# Patient Record
Sex: Male | Born: 1950 | Race: White | Hispanic: No | Marital: Married | Smoking: Former smoker
Health system: Southern US, Community
[De-identification: ages and names within clinical notes are randomized; demographics above are authoritative.]

## PROBLEM LIST (undated history)

## (undated) DIAGNOSIS — G4733 Obstructive sleep apnea (adult) (pediatric): Secondary | ICD-10-CM

## (undated) DIAGNOSIS — I10 Essential (primary) hypertension: Secondary | ICD-10-CM

## (undated) DIAGNOSIS — K219 Gastro-esophageal reflux disease without esophagitis: Secondary | ICD-10-CM

## (undated) DIAGNOSIS — Z973 Presence of spectacles and contact lenses: Secondary | ICD-10-CM

## (undated) DIAGNOSIS — N529 Male erectile dysfunction, unspecified: Secondary | ICD-10-CM

## (undated) DIAGNOSIS — M1990 Unspecified osteoarthritis, unspecified site: Secondary | ICD-10-CM

## (undated) DIAGNOSIS — R7303 Prediabetes: Secondary | ICD-10-CM

## (undated) DIAGNOSIS — T7840XA Allergy, unspecified, initial encounter: Secondary | ICD-10-CM

## (undated) DIAGNOSIS — E119 Type 2 diabetes mellitus without complications: Secondary | ICD-10-CM

## (undated) DIAGNOSIS — I2510 Atherosclerotic heart disease of native coronary artery without angina pectoris: Secondary | ICD-10-CM

## (undated) DIAGNOSIS — N400 Enlarged prostate without lower urinary tract symptoms: Secondary | ICD-10-CM

## (undated) DIAGNOSIS — I480 Paroxysmal atrial fibrillation: Secondary | ICD-10-CM

## (undated) DIAGNOSIS — C61 Malignant neoplasm of prostate: Secondary | ICD-10-CM

## (undated) DIAGNOSIS — E782 Mixed hyperlipidemia: Secondary | ICD-10-CM

## (undated) HISTORY — DX: Paroxysmal atrial fibrillation: I48.0

## (undated) HISTORY — PX: OTHER SURGICAL HISTORY: SHX169

## (undated) HISTORY — PX: POLYPECTOMY: SHX149

## (undated) HISTORY — DX: Prediabetes: R73.03

## (undated) HISTORY — DX: Allergy, unspecified, initial encounter: T78.40XA

## (undated) HISTORY — PX: COLONOSCOPY: SHX174

## (undated) HISTORY — PX: PROSTATE BIOPSY: SHX241

## (undated) HISTORY — DX: Malignant neoplasm of prostate: C61

## (undated) HISTORY — PX: FINGER SURGERY: SHX640

---

## 2001-04-04 ENCOUNTER — Emergency Department (HOSPITAL_COMMUNITY): Admission: EM | Admit: 2001-04-04 | Discharge: 2001-04-04 | Payer: Self-pay | Admitting: Emergency Medicine

## 2001-04-04 ENCOUNTER — Encounter: Payer: Self-pay | Admitting: Emergency Medicine

## 2002-01-07 ENCOUNTER — Encounter: Admission: RE | Admit: 2002-01-07 | Discharge: 2002-01-07 | Payer: Self-pay | Admitting: Gastroenterology

## 2002-01-07 ENCOUNTER — Encounter: Payer: Self-pay | Admitting: Gastroenterology

## 2002-01-26 ENCOUNTER — Encounter: Payer: Self-pay | Admitting: Internal Medicine

## 2005-02-17 HISTORY — PX: ELBOW SURGERY: SHX618

## 2005-03-18 ENCOUNTER — Encounter: Payer: Self-pay | Admitting: Internal Medicine

## 2009-08-17 DIAGNOSIS — C61 Malignant neoplasm of prostate: Secondary | ICD-10-CM

## 2009-08-17 HISTORY — DX: Malignant neoplasm of prostate: C61

## 2009-09-25 ENCOUNTER — Ambulatory Visit: Admission: RE | Admit: 2009-09-25 | Discharge: 2009-10-23 | Payer: Self-pay | Admitting: Radiation Oncology

## 2010-02-27 ENCOUNTER — Ambulatory Visit (HOSPITAL_COMMUNITY)
Admission: RE | Admit: 2010-02-27 | Discharge: 2010-02-27 | Payer: Self-pay | Source: Home / Self Care | Attending: Urology | Admitting: Urology

## 2010-03-04 LAB — CREATININE, SERUM
Creatinine, Ser: 1.04 mg/dL (ref 0.4–1.5)
GFR calc Af Amer: >60 mL/min (ref >60)
GFR calc non Af Amer: >60 mL/min (ref >60)

## 2011-02-18 DIAGNOSIS — C61 Malignant neoplasm of prostate: Secondary | ICD-10-CM

## 2011-02-18 HISTORY — DX: Malignant neoplasm of prostate: C61

## 2013-02-13 ENCOUNTER — Emergency Department (HOSPITAL_COMMUNITY)
Admission: EM | Admit: 2013-02-13 | Discharge: 2013-02-13 | Disposition: A | Discharge status: Home / Self Care | Payer: No Typology Code available for payment source | Attending: Emergency Medicine | Admitting: Emergency Medicine

## 2013-02-13 ENCOUNTER — Encounter (HOSPITAL_COMMUNITY): Payer: Self-pay | Admitting: Emergency Medicine

## 2013-02-13 DIAGNOSIS — R51 Headache: Secondary | ICD-10-CM | POA: Insufficient documentation

## 2013-02-13 DIAGNOSIS — Z8546 Personal history of malignant neoplasm of prostate: Secondary | ICD-10-CM | POA: Insufficient documentation

## 2013-02-13 DIAGNOSIS — I10 Essential (primary) hypertension: Secondary | ICD-10-CM | POA: Insufficient documentation

## 2013-02-13 HISTORY — DX: Essential (primary) hypertension: I10

## 2013-02-13 MED ORDER — QUINAPRIL HCL 10 MG PO TABS: 10.0000 mg | ORAL_TABLET | Freq: Every day | ORAL | Status: DC

## 2013-02-13 MED ORDER — LISINOPRIL 10 MG PO TABS
10.0000 mg | ORAL_TABLET | Freq: Every day | ORAL | Status: DC
  Administered 2013-02-13: 10 mg via ORAL
  Filled 2013-02-13: qty 1

## 2013-02-13 NOTE — ED Provider Notes (Signed)
Medical screening examination/treatment/procedure(s) were performed by non-physician practitioner and as supervising physician I was immediately available for consultation/collaboration.  EKG Interpretation   None        Ethelda Chick, MD 02/13/13 1247

## 2013-02-13 NOTE — ED Notes (Signed)
Pt states that he was being assessed for insurance purposes.  BP was over 200 systolic.  Was referred to [facility] family who sent him here d/t BP still being above 200 systolic.  States hx of htn.  Was on quinopril 20 mgs.  States that he quit taking it 6 mos ago because "he didn't need it".  Asymptomatic at this point.

## 2013-02-13 NOTE — ED Provider Notes (Signed)
CSN: [id number]     Arrival date & time 02/13/13  1120 History   First MD Initiated Contact with Patient 02/13/13 1210    This chart was scribed for James Edwards, a non-physician practitioner working with Ethelda Chick, MD by Lewanda Rife, ED Scribe. This patient was seen in room WTR5/WTR5 and the patient's care was started at 12:19 PM     Chief Complaint  Patient presents with  . Hypertension   (Consider location/radiation/quality/duration/timing/severity/associated sxs/prior Treatment) The history is provided by the patient. No language interpreter was used.   HPI Comments: James Edwards is a 62 y.o. male who presents to the Emergency Department with a PMHx of prostate cancer and HTN complaining of hypertension onset today checked BP at [facility] Physician's was 200/98 and it was recommended to go to the ED. Reports associated baseline non-acute headache. Denies any aggravating factors. Reports HTN is usually alleviated by quinapril. Denies associated facial drooping, vision loss, visual disturbances, abdominal pain, chest pain, shortness of breath, change in gait, and urinary or bowel changes. Self-discontinued BP meds 6 months ago.   Past Medical History  Diagnosis Date  . Hypertension    History reviewed. No pertinent past surgical history. History reviewed. No pertinent family history. History  Substance Use Topics  . Smoking status: Never Smoker   . Smokeless tobacco: Not on file  . Alcohol Use: No    Review of Systems  Constitutional: Negative for fever.  HENT: Negative for rhinorrhea and sore throat.   Eyes: Negative for redness and visual disturbance.  Respiratory: Negative for cough and shortness of breath.   Cardiovascular: Negative for chest pain.  Gastrointestinal: Negative for nausea, vomiting, abdominal pain, diarrhea and constipation.  Genitourinary: Negative for dysuria and decreased urine volume.  Musculoskeletal: Negative for gait problem and  myalgias.  Skin: Negative for rash.  Neurological: Negative for facial asymmetry, speech difficulty, weakness, numbness and headaches.  Psychiatric/Behavioral: Negative for confusion.    Allergies  Codeine  Home Medications   Current Outpatient Rx  Name  Route  Sig  Dispense  Refill  . acetaminophen (TYLENOL) 325 MG tablet   Oral   Take 650 mg by mouth every 6 (six) hours as needed for mild pain or moderate pain.          BP 179/89  Pulse 78  Temp(Src) 98.4 F (36.9 C) (Oral)  Resp 14  SpO2 99% Physical Exam  Nursing note and vitals reviewed. Constitutional: He appears well-developed and well-nourished. No distress.  HENT:  Head: Normocephalic and atraumatic.  Eyes: Conjunctivae and EOM are normal. Right eye exhibits no discharge. Left eye exhibits no discharge.  Neck: Normal range of motion. Neck supple. No tracheal deviation present.  Cardiovascular: Normal rate, regular rhythm and normal heart sounds.   No murmur heard. Pulmonary/Chest: Effort normal and breath sounds normal. No respiratory distress. He has no wheezes. He has no rales.  Abdominal: Soft. Bowel sounds are normal. There is no tenderness.  Musculoskeletal: Normal range of motion.  Neurological: He is alert.  Skin: Skin is warm and dry.  Psychiatric: He has a normal mood and affect. His behavior is normal.    ED Course  Procedures (including critical care time)  COORDINATION OF CARE:  Nursing notes reviewed. Vital signs reviewed. Initial pt interview and examination performed.   12:24 PM-Discussed treatment plan with pt at bedside. Pt agrees with plan.   Treatment plan initiated:Medications - No data to display   Initial diagnostic testing ordered.  Labs Review Labs Reviewed - No data to display Imaging Review No results found.  EKG Interpretation   None      Vital signs reviewed and are as follows: Filed Vitals:   02/13/13 1132  BP: 179/89  Pulse: 78  Temp: 98.4 F (36.9 C)   Resp: 14   Will restart antihypertensives. Patient encouraged to followup with PCP for appropriate titration of medications.  Patient counseled to return if they have weakness in their arms or legs, slurred speech, trouble walking or talking, confusion, trouble with their balance, or if they have any other concerns. Patient verbalizes understanding and agrees with plan.    MDM   1. Hypertension    Patient with hypertension, no signs of end organ damage given history and exam. Will restart antihypertensive. No other acute treatment or workup indicated at this time. Patient to followup with his primary care physician and return with worsening symptoms.  I personally performed the services described in this documentation, which was scribed in my presence. The recorded information has been reviewed and is accurate.    [name], PA-C 02/13/13 1243

## 2014-10-18 ENCOUNTER — Ambulatory Visit (INDEPENDENT_AMBULATORY_CARE_PROVIDER_SITE_OTHER): Payer: Medicare Other | Admitting: Adult Health

## 2014-10-18 ENCOUNTER — Encounter: Payer: Self-pay | Admitting: Adult Health

## 2014-10-18 VITALS — BP 130/70 | Temp 98.4°F | Ht 69.5 in | Wt 198.0 lb

## 2014-10-18 DIAGNOSIS — Z23 Encounter for immunization: Secondary | ICD-10-CM | POA: Diagnosis not present

## 2014-10-18 DIAGNOSIS — Z7689 Persons encountering health services in other specified circumstances: Secondary | ICD-10-CM

## 2014-10-18 DIAGNOSIS — C61 Malignant neoplasm of prostate: Secondary | ICD-10-CM | POA: Diagnosis not present

## 2014-10-18 DIAGNOSIS — Z7189 Other specified counseling: Secondary | ICD-10-CM | POA: Diagnosis not present

## 2014-10-18 DIAGNOSIS — I10 Essential (primary) hypertension: Secondary | ICD-10-CM | POA: Diagnosis not present

## 2014-10-18 MED ORDER — QUINAPRIL HCL 40 MG PO TABS: 40.0000 mg | ORAL_TABLET | Freq: Every day | ORAL | Status: DC

## 2014-10-18 NOTE — Patient Instructions (Signed)
It was great meeting you today!  Schedule a complete physical for a month or two. If you need anything in the meantime, please let me know.   Please continue to work on diet and exercise.

## 2014-10-18 NOTE — Progress Notes (Signed)
HPI:  James Edwards is here to establish care. He is a very pleasant Caucasian male who  has a past medical history of Hypertension and Prostate cancer.  Last PCP and physical:Unknown, was not in the last year. Was a patient at [facility]   Immunizations: Needs Pneumovax 23 next year Diet:Does not follow a diet. Eats fruits but not a lot of vegetables. Not a lot of red meat.  Exercise: Walks for work. Does not do anything outside of work  Colonoscopy: Longer than 10 years, does not want one right now.  Eye: Sees eye doctor Dentist: Does not see  Has the following chronic problems that require follow up and concerns today:   Prostate Cancer - He is followed by [facility], Dr. Alinda Money and sees him quarterly. He has never had to have chemo or radiation for his prostate cancer. Denies any complications currently.    ROS negative for unless reported above: fevers, chills,feeling poorly, unintentional weight loss, hearing or vision loss, chest pain, palpitations, leg claudication, struggling to breath,Not feeling congested in the chest, no orthopenia, no cough,no wheezing, normal appetite, no soft tissue swelling, no hemoptysis, melena, hematochezia, hematuria, falls, loc, si, or thoughts of self harm.    Past Medical History  Diagnosis Date  . Hypertension   . Prostate cancer     Past Surgical History  Procedure Laterality Date  . Elbow surgery      Family History  Problem Relation Age of Onset  . Arthritis Father   . Heart disease Mother   . Heart disease Father   . Hypertension Father   . Hypertension Mother     Social History   Social History  . Marital Status: Married    Spouse Name: N/A  . Number of Children: N/A  . Years of Education: N/A   Social History Main Topics  . Smoking status: Former Research scientist (life sciences)  . Smokeless tobacco: None     Comment: per pt stopped in 2006   . Alcohol Use: 0.0 oz/week    0 Standard drinks or equivalent per week   Comment: 6 pack a day   . Drug Use: No  . Sexual Activity: Not Asked   Other Topics Concern  . None   Social History Narrative     Current outpatient prescriptions:  .  acetaminophen (TYLENOL) 325 MG tablet, Take 650 mg by mouth every 6 (six) hours as needed for mild pain or moderate pain., Disp: , Rfl:  .  quinapril (ACCUPRIL) 40 MG tablet, Take 40 mg by mouth at bedtime., Disp: , Rfl:   EXAM:  Filed Vitals:   10/18/14 0856  BP: 130/70  Temp: 98.4 F (36.9 C)    Body mass index is 28.83 kg/(m^2).  GENERAL: vitals reviewed and listed above, alert, oriented, appears well hydrated and in no acute distress. Slightly obese  HEENT: atraumatic, conjunttiva clear, no obvious abnormalities on inspection of external nose and ears. TM's visualized, no cerumen impaction. Multiple filled cavities. Wears glasses  NECK: Neck is soft and supple without masses, no adenopathy or thyromegaly, trachea midline, no JVD. Normal range of motion.   LUNGS: clear to auscultation bilaterally, no wheezes, rales or rhonchi, good air movement  CV: Regular rate and rhythm, normal S1/S2, no audible murmurs, gallops, or rubs. No carotid bruit and no peripheral edema.   MS: moves all extremities without noticeable abnormality. No edema noted  Abd: soft/nontender/nondistended/normal bowel sounds. Obese around abdomen.   Skin: warm and dry, no rash  Extremities: No clubbing, cyanosis, or edema. Capillary refill is WNL. Pulses intact bilaterally in upper and lower extremities.   Neuro: CN II-XII intact, sensation and reflexes normal throughout, 5/5 muscle strength in bilateral upper and lower extremities. Normal finger to nose. Normal rapid alternating movements. Normal romberg. No pronator drift.   PSYCH: pleasant and cooperative, no obvious depression or anxiety  ASSESSMENT AND PLAN:  1. Encounter to establish care - Follow up in one month for CPE - Follow up sooner if needed - Stressed the  importance of exercising outside of work and following a heart healthy diet.   2. Essential hypertension - Controlled - no change - quinapril (ACCUPRIL) 40 MG tablet; Take 1 tablet (40 mg total) by mouth daily.  Dispense: 90 tablet; Refill: 3  3. Prostate cancer - Continue with current treatment by Urology  4. Need for Streptococcus pneumoniae vaccination - Pneumococcal conjugate vaccine 13-valent IM  5. Encounter for immunization - High dose flu given    -We reviewed the PMH, PSH, FH, SH, Meds and Allergies. -We provided refills for any medications we will prescribe as needed. -We addressed current concerns per orders and patient instructions. -We have asked for records for pertinent exams, studies, vaccines and notes from previous providers. -We have advised patient to follow up per instructions below.   -Patient advised to return or notify a provider immediately if symptoms worsen or persist or new concerns arise.    Dorothyann Peng, AGNP

## 2014-10-25 DIAGNOSIS — C61 Malignant neoplasm of prostate: Secondary | ICD-10-CM | POA: Diagnosis not present

## 2014-10-30 ENCOUNTER — Ambulatory Visit: Payer: Self-pay | Admitting: Adult Health

## 2014-11-01 DIAGNOSIS — C61 Malignant neoplasm of prostate: Secondary | ICD-10-CM | POA: Diagnosis not present

## 2015-02-22 DIAGNOSIS — H2513 Age-related nuclear cataract, bilateral: Secondary | ICD-10-CM | POA: Diagnosis not present

## 2015-05-04 DIAGNOSIS — C61 Malignant neoplasm of prostate: Secondary | ICD-10-CM | POA: Diagnosis not present

## 2015-05-11 DIAGNOSIS — Z0000 Encounter for general adult medical examination without abnormal findings: Secondary | ICD-10-CM | POA: Diagnosis not present

## 2015-05-11 DIAGNOSIS — C61 Malignant neoplasm of prostate: Secondary | ICD-10-CM | POA: Diagnosis not present

## 2015-05-11 DIAGNOSIS — N5201 Erectile dysfunction due to arterial insufficiency: Secondary | ICD-10-CM | POA: Diagnosis not present

## 2015-10-15 ENCOUNTER — Other Ambulatory Visit: Payer: Self-pay | Admitting: Adult Health

## 2015-10-15 DIAGNOSIS — I10 Essential (primary) hypertension: Secondary | ICD-10-CM

## 2015-10-24 ENCOUNTER — Encounter: Payer: Self-pay | Admitting: Adult Health

## 2015-10-24 ENCOUNTER — Ambulatory Visit (INDEPENDENT_AMBULATORY_CARE_PROVIDER_SITE_OTHER): Payer: Medicare Other | Admitting: Adult Health

## 2015-10-24 VITALS — BP 138/90 | Temp 98.6°F | Ht 69.5 in | Wt 201.8 lb

## 2015-10-24 DIAGNOSIS — Z23 Encounter for immunization: Secondary | ICD-10-CM

## 2015-10-24 DIAGNOSIS — I10 Essential (primary) hypertension: Secondary | ICD-10-CM | POA: Diagnosis not present

## 2015-10-24 MED ORDER — QUINAPRIL HCL 40 MG PO TABS: 40.0000 mg | ORAL_TABLET | Freq: Every day | ORAL | 3 refills | Status: DC

## 2015-10-24 NOTE — Patient Instructions (Signed)
It was great seeing you again  I will send in your prescription   Schedule your physical on the way out.   Please let me know if you need anything

## 2015-10-24 NOTE — Progress Notes (Signed)
Subjective:    Patient ID: James Edwards, male    DOB: 07-26-1950, 65 y.o.   MRN: [id number]  HPI  65 year old male who presents to the office today for follow up regarding hypertension and to have his pneumonia and influenza vaccination.   He reports that his blood pressure has been well controlled. He has not had any blurred vision, headaches or dizziness. He continues to walk daily and is eating healthy.   He voices no concerns or acute issues that he would like addressed at this time.    Review of Systems  Constitutional: Negative.   Respiratory: Negative.   Cardiovascular: Negative.   Neurological: Negative.   Psychiatric/Behavioral: Negative.   All other systems reviewed and are negative.  Past Medical History:  Diagnosis Date  . Hypertension   . Prostate cancer [facility])     Social History   Social History  . Marital status: Married    Spouse name: N/A  . Number of children: N/A  . Years of education: N/A   Occupational History  . Not on file.   Social History Main Topics  . Smoking status: Former Research scientist (life sciences)  . Smokeless tobacco: Not on file     Comment: per pt stopped in 2006   . Alcohol use 0.0 oz/week     Comment: 6 pack a day   . Drug use: No  . Sexual activity: Not on file   Other Topics Concern  . 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    Two Children who both live locally.       He likes to sleep.     Past Surgical History:  Procedure Laterality Date  . ELBOW SURGERY  2007    Family History  Problem Relation Age of Onset  . Arthritis Father   . Heart disease Mother   . Heart disease Father   . Hypertension Father   . Hypertension Mother   . Bladder Cancer Mother   . Bladder Cancer Father   . Glaucoma Mother     Allergies  Allergen Reactions  . Codeine     NV    Current Outpatient Prescriptions on File Prior to Visit  Medication Sig Dispense  Refill  . acetaminophen (TYLENOL) 325 MG tablet Take 650 mg by mouth every 6 (six) hours as needed for mild pain or moderate pain.    Marland Kitchen quinapril (ACCUPRIL) 40 MG tablet TAKE ONE TABLET BY MOUTH ONCE DAILY 30 tablet 0   No current facility-administered medications on file prior to visit.     BP 138/90   Temp 98.6 F (37 C) (Oral)   Ht 5' 9.5" (1.765 m)   Wt 201 lb 12.8 oz (91.5 kg)   BMI 29.37 kg/m       Objective:   Physical Exam  Constitutional: He is oriented to person, place, and time. He appears well-developed and well-nourished. No distress.  Cardiovascular: Normal rate, regular rhythm, normal heart sounds and intact distal pulses.  Exam reveals no gallop.   No murmur heard. Pulmonary/Chest: Effort normal and breath sounds normal. No respiratory distress. He has no wheezes. He has no rales. He exhibits no tenderness.  Musculoskeletal: Normal range of motion. He exhibits no edema, tenderness or deformity.  Neurological: He is alert and oriented to person, place, and time.  Skin: Skin is warm and dry. No rash  noted. He is not diaphoretic. No erythema. No pallor.  Psychiatric: He has a normal mood and affect. His behavior is normal. Judgment and thought content normal.  Nursing note and vitals reviewed.     Assessment & Plan:  1. Need for prophylactic vaccination and inoculation against influenza - Flu vaccine HIGH DOSE PF (Fluzone High dose)  2. Essential hypertension - quinapril (ACCUPRIL) 40 MG tablet; Take 1 tablet (40 mg total) by mouth daily.  Dispense: 90 tablet; Refill: 3 - Advised that he needs to follow up for his CPE   3. Need for prophylactic vaccination against Streptococcus pneumoniae (pneumococcus)  - Pneumococcal polysaccharide vaccine 23-valent greater than or equal to 2yo subcutaneous/IM  Dorothyann Peng, NP

## 2015-12-13 DIAGNOSIS — C61 Malignant neoplasm of prostate: Secondary | ICD-10-CM | POA: Diagnosis not present

## 2015-12-24 ENCOUNTER — Other Ambulatory Visit: Payer: Self-pay | Admitting: Urology

## 2015-12-25 ENCOUNTER — Other Ambulatory Visit (HOSPITAL_COMMUNITY): Payer: Self-pay | Admitting: Urology

## 2015-12-25 DIAGNOSIS — C61 Malignant neoplasm of prostate: Secondary | ICD-10-CM

## 2016-01-02 ENCOUNTER — Encounter: Payer: Medicare Other | Admitting: Adult Health

## 2016-01-03 ENCOUNTER — Other Ambulatory Visit (INDEPENDENT_AMBULATORY_CARE_PROVIDER_SITE_OTHER): Payer: Medicare Other

## 2016-01-03 ENCOUNTER — Encounter: Payer: Self-pay | Admitting: Adult Health

## 2016-01-03 ENCOUNTER — Telehealth: Payer: Self-pay | Admitting: Adult Health

## 2016-01-03 ENCOUNTER — Ambulatory Visit (INDEPENDENT_AMBULATORY_CARE_PROVIDER_SITE_OTHER): Payer: Medicare Other | Admitting: Adult Health

## 2016-01-03 VITALS — BP 168/88 | Temp 98.4°F | Ht 69.5 in | Wt 200.0 lb

## 2016-01-03 DIAGNOSIS — Z8249 Family history of ischemic heart disease and other diseases of the circulatory system: Secondary | ICD-10-CM | POA: Diagnosis not present

## 2016-01-03 DIAGNOSIS — C61 Malignant neoplasm of prostate: Secondary | ICD-10-CM | POA: Diagnosis not present

## 2016-01-03 DIAGNOSIS — Z1211 Encounter for screening for malignant neoplasm of colon: Secondary | ICD-10-CM

## 2016-01-03 DIAGNOSIS — I10 Essential (primary) hypertension: Secondary | ICD-10-CM

## 2016-01-03 DIAGNOSIS — E119 Type 2 diabetes mellitus without complications: Secondary | ICD-10-CM

## 2016-01-03 DIAGNOSIS — M1990 Unspecified osteoarthritis, unspecified site: Secondary | ICD-10-CM

## 2016-01-03 LAB — CBC WITH DIFFERENTIAL/PLATELET
Basophils Absolute: 0.1 10*3/uL (ref 0.0–0.1)
Basophils Relative: 0.6 % (ref 0.0–3.0)
Eosinophils Absolute: 0.4 10*3/uL (ref 0.0–0.7)
Eosinophils Relative: 3.8 % (ref 0.0–5.0)
HCT: 40.6 % (ref 39.0–52.0)
Hemoglobin: 13.8 g/dL (ref 13.0–17.0)
Lymphocytes Relative: 18.5 % (ref 12.0–46.0)
Lymphs Abs: 1.9 10*3/uL (ref 0.7–4.0)
MCHC: 34.1 g/dL (ref 30.0–36.0)
MCV: 93.3 fl (ref 78.0–100.0)
Monocytes Absolute: 0.8 10*3/uL (ref 0.1–1.0)
Monocytes Relative: 7.9 % (ref 3.0–12.0)
Neutro Abs: 7 10*3/uL (ref 1.4–7.7)
Neutrophils Relative %: 69.2 % (ref 43.0–77.0)
Platelets: 290 10*3/uL (ref 150.0–400.0)
RBC: 4.35 Mil/uL (ref 4.22–5.81)
RDW: 12.8 % (ref 11.5–15.5)
WBC: 10.1 10*3/uL (ref 4.0–10.5)

## 2016-01-03 LAB — HEPATIC FUNCTION PANEL
ALT: 20 U/L (ref 0–53)
AST: 26 U/L (ref 0–37)
Albumin: 4.1 g/dL (ref 3.5–5.2)
Alkaline Phosphatase: 33 U/L — ABNORMAL LOW (ref 39–117)
Bilirubin, Direct: 0.1 mg/dL (ref 0.0–0.3)
Total Bilirubin: 0.5 mg/dL (ref 0.2–1.2)
Total Protein: 6.7 g/dL (ref 6.0–8.3)

## 2016-01-03 LAB — BASIC METABOLIC PANEL
BUN: 11 mg/dL (ref 6–23)
CO2: 27 mEq/L (ref 19–32)
Calcium: 9.9 mg/dL (ref 8.4–10.5)
Chloride: 102 mEq/L (ref 96–112)
Creatinine, Ser: 1.02 mg/dL (ref 0.40–1.50)
GFR: 77.6 mL/min (ref >60.00)
Glucose, Bld: 155 mg/dL — ABNORMAL HIGH (ref 70–99)
Potassium: 5.3 mEq/L — ABNORMAL HIGH (ref 3.5–5.1)
Sodium: 137 mEq/L (ref 135–145)

## 2016-01-03 LAB — LIPID PANEL
Cholesterol: 194 mg/dL (ref 0–200)
HDL: 65 mg/dL (ref >39.00)
NonHDL: 129.39
Total CHOL/HDL Ratio: 3
Triglycerides: 296 mg/dL — ABNORMAL HIGH (ref 0.0–149.0)
VLDL: 59.2 mg/dL — ABNORMAL HIGH (ref 0.0–40.0)

## 2016-01-03 LAB — POC URINALSYSI DIPSTICK (AUTOMATED)
Bilirubin, UA: NEGATIVE
Blood, UA: NEGATIVE
Glucose, UA: NEGATIVE
Ketones, UA: NEGATIVE
Leukocytes, UA: NEGATIVE
Nitrite, UA: NEGATIVE
Protein, UA: NEGATIVE
Spec Grav, UA: 1.02
Urobilinogen, UA: 0.2
pH, UA: 5

## 2016-01-03 LAB — HEMOGLOBIN A1C: Hgb A1c MFr Bld: 6.6 % — ABNORMAL HIGH (ref 4.6–6.5)

## 2016-01-03 LAB — TSH: TSH: 1.2 u[IU]/mL (ref 0.35–4.50)

## 2016-01-03 LAB — LDL CHOLESTEROL, DIRECT: Direct LDL: 97 mg/dL

## 2016-01-03 MED ORDER — MELOXICAM 7.5 MG PO TABS: 7.5000 mg | ORAL_TABLET | Freq: Every day | ORAL | 0 refills | Status: DC

## 2016-01-03 NOTE — Patient Instructions (Signed)
It was great seeing you this morning.   Someone will call you to schedule your colonoscopy.   I have sent in a medication call Mobic. Take this once a day   Continue to work on diet and exercise  Follow up with me in one year   Health Maintenance, Male A healthy lifestyle and preventative care can promote health and wellness.  Maintain regular health, dental, and eye exams.  Eat a healthy diet. Foods like vegetables, fruits, whole grains, low-fat dairy products, and lean protein foods contain the nutrients you need and are low in calories. Decrease your intake of foods high in solid fats, added sugars, and salt. Get information about a proper diet from your health care provider, if necessary.  Regular physical exercise is one of the most important things you can do for your health. Most adults should get at least 150 minutes of moderate-intensity exercise (any activity that increases your heart rate and causes you to sweat) each week. In addition, most adults need muscle-strengthening exercises on 2 or more days a week.   Maintain a healthy weight. The body mass index (BMI) is a screening tool to identify possible weight problems. It provides an estimate of body fat based on height and weight. Your health care provider can find your BMI and can help you achieve or maintain a healthy weight. For males 20 years and older:  A BMI below 18.5 is considered underweight.  A BMI of 18.5 to 24.9 is normal.  A BMI of 25 to 29.9 is considered overweight.  A BMI of 30 and above is considered obese.  Maintain normal blood lipids and cholesterol by exercising and minimizing your intake of saturated fat. Eat a balanced diet with plenty of fruits and vegetables. Blood tests for lipids and cholesterol should begin at age 84 and be repeated every 5 years. If your lipid or cholesterol levels are high, you are over age 73, or you are at high risk for heart disease, you may need your cholesterol levels  checked more frequently.Ongoing high lipid and cholesterol levels should be treated with medicines if diet and exercise are not working.  If you smoke, find out from your health care provider how to quit. If you do not use tobacco, do not start.  Lung cancer screening is recommended for adults aged 51-80 years who are at high risk for developing lung cancer because of a history of smoking. A yearly low-dose CT scan of the lungs is recommended for people who have at least a 30-pack-year history of smoking and are current smokers or have quit within the past 15 years. A pack year of smoking is smoking an average of 1 pack of cigarettes a day for 1 year (for example, a 30-pack-year history of smoking could mean smoking 1 pack a day for 30 years or 2 packs a day for 15 years). Yearly screening should continue until the smoker has stopped smoking for at least 15 years. Yearly screening should be stopped for people who develop a health problem that would prevent them from having lung cancer treatment.  If you choose to drink alcohol, do not have more than 2 drinks per day. One drink is considered to be 12 oz (360 mL) of beer, 5 oz (150 mL) of wine, or 1.5 oz (45 mL) of liquor.  Avoid the use of street drugs. Do not share needles with anyone. Ask for help if you need support or instructions about stopping the use of drugs.  High blood pressure causes heart disease and increases the risk of stroke. High blood pressure is more likely to develop in:  People who have blood pressure in the end of the normal range (100-139/85-89 mm Hg).  People who are overweight or obese.  People who are African American.  If you are 42-49 years of age, have your blood pressure checked every 3-5 years. If you are 38 years of age or older, have your blood pressure checked every year. You should have your blood pressure measured twice--once when you are at a hospital or clinic, and once when you are not at a hospital or clinic.  Record the average of the two measurements. To check your blood pressure when you are not at a hospital or clinic, you can use:  An automated blood pressure machine at a pharmacy.  A home blood pressure monitor.  If you are 18-62 years old, ask your health care provider if you should take aspirin to prevent heart disease.  Diabetes screening involves taking a blood sample to check your fasting blood sugar level. This should be done once every 3 years after age 42 if you are at a normal weight and without risk factors for diabetes. Testing should be considered at a younger age or be carried out more frequently if you are overweight and have at least 1 risk factor for diabetes.  Colorectal cancer can be detected and often prevented. Most routine colorectal cancer screening begins at the age of 58 and continues through age 38. However, your health care provider may recommend screening at an earlier age if you have risk factors for colon cancer. On a yearly basis, your health care provider may provide home test kits to check for hidden blood in the stool. A small camera at the end of a tube may be used to directly examine the colon (sigmoidoscopy or colonoscopy) to detect the earliest forms of colorectal cancer. Talk to your health care provider about this at age 44 when routine screening begins. A direct exam of the colon should be repeated every 5-10 years through age 56, unless early forms of precancerous polyps or small growths are found.  People who are at an increased risk for hepatitis B should be screened for this virus. You are considered at high risk for hepatitis B if:  You were born in a country where hepatitis B occurs often. Talk with your health care provider about which countries are considered high risk.  Your parents were born in a high-risk country and you have not received a shot to protect against hepatitis B (hepatitis B vaccine).  You have HIV or AIDS.  You use needles to  inject street drugs.  You live with, or have sex with, someone who has hepatitis B.  You are a man who has sex with other men (MSM).  You get hemodialysis treatment.  You take certain medicines for conditions like cancer, organ transplantation, and autoimmune conditions.  Hepatitis C blood testing is recommended for all people born from [birth year] through 1965 and any individual with known risk factors for hepatitis C.  Healthy men should no longer receive prostate-specific antigen (PSA) blood tests as part of routine cancer screening. Talk to your health care provider about prostate cancer screening.  Testicular cancer screening is not recommended for adolescents or adult males who have no symptoms. Screening includes self-exam, a health care provider exam, and other screening tests. Consult with your health care provider about any symptoms you have or any  concerns you have about testicular cancer.  Practice safe sex. Use condoms and avoid high-risk sexual practices to reduce the spread of sexually transmitted infections (STIs).  You should be screened for STIs, including gonorrhea and chlamydia if:  You are sexually active and are younger than 24 years.  You are older than 24 years, and your health care provider tells you that you are at risk for this type of infection.  Your sexual activity has changed since you were last screened, and you are at an increased risk for chlamydia or gonorrhea. Ask your health care provider if you are at risk.  If you are at risk of being infected with HIV, it is recommended that you take a prescription medicine daily to prevent HIV infection. This is called pre-exposure prophylaxis (PrEP). You are considered at risk if:  You are a man who has sex with other men (MSM).  You are a heterosexual man who is sexually active with multiple partners.  You take drugs by injection.  You are sexually active with a partner who has HIV.  Talk with your health care  provider about whether you are at high risk of being infected with HIV. If you choose to begin PrEP, you should first be tested for HIV. You should then be tested every 3 months for as long as you are taking PrEP.  Use sunscreen. Apply sunscreen liberally and repeatedly throughout the day. You should seek shade when your shadow is shorter than you. Protect yourself by wearing long sleeves, pants, a wide-brimmed hat, and sunglasses year round whenever you are outdoors.  Tell your health care provider of new moles or changes in moles, especially if there is a change in shape or color. Also, tell your health care provider if a mole is larger than the size of a pencil eraser.  A one-time screening for abdominal aortic aneurysm (AAA) and surgical repair of large AAAs by ultrasound is recommended for men aged 67-75 years who are current or former smokers.  Stay current with your vaccines (immunizations).   This information is not intended to replace advice given to you by your health care provider. Make sure you discuss any questions you have with your health care provider.   Document Released: 08/02/2007 Document Revised: 02/24/2014 Document Reviewed: 07/01/2010 Elsevier Interactive Patient Education [employer].

## 2016-01-03 NOTE — Progress Notes (Addendum)
Subjective:    Patient ID: James Edwards, male    DOB: 01-26-1951, 65 y.o.   MRN: [id number]  HPI  65 year old male who presents to the office today for yearly follow up regarding hypertension and prostate cancer.   He reports that his blood pressure has been well controlled.. He has not had any blurred vision, headaches, or dizziness. He takes Accupril 40mg  tablet for hypertension   He is followed by [facility], Dr. Alinda Money and sees them every three months. He is having a biopsy done on December 7th.   He reports that his arthritic pain has started to become worse. He is taking Tylenol which " numbs" the pain. Pain is worse in bilateral hips. He does not want to see orthopedics yet.   All immunizations and health maintenance protocols were reviewed with the patient and needed orders were placed.  Appropriate screening laboratory values were ordered for the patient including screening of hyperlipidemia, renal function and hepatic function. If indicated by BPH, a PSA was ordered.  Medication reconciliation,  past medical history, social history, problem list and allergies were reviewed in detail with the patient  Goals were established with regard to weight loss, exercise, and  diet in compliance with medications. He continues to eat healthy and exercise  End of life planning was discussed.  He is ready to have a colonoscopy. He has seen his eye doctor but has not seen a dentist this year.    Review of Systems  Constitutional: Negative.   HENT: Negative.   Eyes: Negative.   Respiratory: Negative.   Cardiovascular: Negative.   Gastrointestinal: Negative.   Endocrine: Negative.   Genitourinary: Negative.   Musculoskeletal: Positive for arthralgias.  Skin: Negative.   Allergic/Immunologic: Negative.   Neurological: Negative.   Hematological: Negative.   Psychiatric/Behavioral: Negative.   All other systems reviewed and are negative.  Past Medical History:    Diagnosis Date  . Hypertension   . Prostate cancer [facility])     Social History   Social History  . Marital status: Married    Spouse name: N/A  . Number of children: N/A  . Years of education: N/A   Occupational History  . Not on file.   Social History Main Topics  . Smoking status: Former Research scientist (life sciences)  . Smokeless tobacco: Not on file     Comment: per pt stopped in 2006   . Alcohol use 0.0 oz/week     Comment: 6 pack a day   . Drug use: No  . Sexual activity: Not on file   Other Topics Concern  . 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    Two Children who both live locally.       He likes to sleep.     Past Surgical History:  Procedure Laterality Date  . ELBOW SURGERY  2007    Family History  Problem Relation Age of Onset  . Arthritis Father   . Heart disease Mother   . Heart disease Father   . Hypertension Father   . Hypertension Mother   . Bladder Cancer Mother   . Bladder Cancer Father   . Glaucoma Mother     Allergies  Allergen Reactions  . Codeine     NV    Current Outpatient Prescriptions on File Prior to Visit  Medication Sig Dispense Refill  .  acetaminophen (TYLENOL) 325 MG tablet Take 650 mg by mouth every 6 (six) hours as needed for mild pain or moderate pain.    Marland Kitchen quinapril (ACCUPRIL) 40 MG tablet Take 1 tablet (40 mg total) by mouth daily. 90 tablet 3   No current facility-administered medications on file prior to visit.     BP (!) 168/88   Temp 98.4 F (36.9 C) (Oral)   Ht 5' 9.5" (1.765 m)   Wt 200 lb (90.7 kg)   BMI 29.11 kg/m       Objective:   Physical Exam  Constitutional: He is oriented to person, place, and time. He appears well-developed and well-nourished. No distress.  HENT:  Head: Normocephalic and atraumatic.  Right Ear: External ear normal.  Left Ear: External ear normal.  Nose: Nose normal.  Mouth/Throat: Oropharynx is clear and moist.  No oropharyngeal exudate.  Eyes: Conjunctivae and EOM are normal. Pupils are equal, round, and reactive to light. Right eye exhibits no discharge. Left eye exhibits no discharge. No scleral icterus.  Neck: Normal range of motion. Neck supple. No JVD present. No tracheal deviation present. No thyromegaly present.  Cardiovascular: Normal rate, regular rhythm, normal heart sounds and intact distal pulses.  Exam reveals no gallop and no friction rub.   No murmur heard. Pulmonary/Chest: Effort normal and breath sounds normal. No stridor. No respiratory distress. He has no wheezes. He has no rales. He exhibits no tenderness.  Abdominal: Soft. Bowel sounds are normal. He exhibits no distension and no mass. There is no tenderness. There is no rebound and no guarding.  Genitourinary:  Genitourinary Comments: Deferred: Sees urology 4 times a year  Musculoskeletal: Normal range of motion. He exhibits no edema, tenderness or deformity.  Lymphadenopathy:    He has no cervical adenopathy.  Neurological: He is alert and oriented to person, place, and time. He has normal reflexes. He displays normal reflexes. No cranial nerve deficit. He exhibits normal muscle tone. Coordination normal.  Skin: Skin is warm and dry. No rash noted. He is not diaphoretic. No erythema. No pallor.  Psychiatric: He has a normal mood and affect. His behavior is normal. Judgment and thought content normal.  Nursing note and vitals reviewed.     Assessment & Plan:  1. Essential hypertension - He did not take his blood pressure medication this morning.  - Continue to monitor at home. Return precautions given  - EKG 12-Lead- Sinus  Rhythm  - occasional ectopic ventricular beat    -RSR(V1) -nondiagnostic. Rate 78  - Basic metabolic panel - CBC with Differential/Platelet - Hepatic function panel - Lipid panel - TSH - POCT Urinalysis Dipstick (Automated) - Follow up in one year for next CPE  2. Prostate cancer ([place]) - Follow up  with Urology as directed  3. Family history of heart disease - Basic metabolic panel - CBC with Differential/Platelet - Hepatic function panel - Lipid panel - TSH - POCT Urinalysis Dipstick (Automated)  4. Colon cancer screening - Ambulatory referral to Gastroenterology  5. Arthritis  - meloxicam (MOBIC) 7.5 MG tablet; Take 1 tablet (7.5 mg total) by mouth daily.  Dispense: 30 tablet; Refill: 0  Dorothyann Peng, NP

## 2016-01-03 NOTE — Telephone Encounter (Signed)
Updated patient on his labs. His triglycerides are elevated and his A1c was 6.6. He was informed of the need to change his diet and start exercising.   I am not going to start him on any medications at this time.   He is going to work on changing his lifestyle and I am going to set him up for diabetes education classes.   He will follow up in 3 months for repeat lipid and A1c

## 2016-01-16 NOTE — Patient Instructions (Addendum)
James Edwards  01/16/2016   Your procedure is scheduled on: Monday 01/21/2016  Report to [facility] Main  Entrance take [facility]  elevators to 3rd floor to  [place] at   130   PM.  Call this number if you have problems the morning of surgery [phone]   Remember: ONLY 1 PERSON MAY GO WITH YOU TO SHORT STAY TO GET  READY MORNING OF [place].             USE FLEET'S ENEMA MORNING OF SURGERY BEFORE SURGERY!    Do not eat food  :After Midnight.   MAY HAVE CLEAR LIQUIDS FROM MIDNIGHT UP UNTIL 0930 AM MORNING OF SURGERY THEN NOTHING UNTIL AFTER SURGERY!     CLEAR LIQUID DIET   Foods Allowed                                                                     Foods Excluded  Coffee and tea, regular and decaf                             liquids that you cannot  Plain Jell-O in any flavor                                             see through such as: Fruit ices (not with fruit pulp)                                     milk, soups, orange juice  Iced Popsicles                                    All solid food Carbonated beverages, regular and diet                                    Cranberry, grape and apple juices Sports drinks like Gatorade Lightly seasoned clear broth or consume(fat free) Sugar, honey syrup  Sample Menu Breakfast                                Lunch                                     Supper Cranberry juice                    Beef broth                            Chicken broth Jell-O  Grape juice                           Apple juice Coffee or tea                        Jell-O                                      Popsicle                                                Coffee or tea                        Coffee or tea  _____________________________________________________________________     Take these medicines the morning of surgery with A SIP OF WATER: none, follow dr borden  antibiotic instructions                                  You may not have any metal on your body including hair pins and              piercings  Do not wear jewelry, make-up, lotions, powders or perfumes, deodorant             Do not wear nail polish.  Do not shave  48 hours prior to surgery.              Men may shave face and neck.   Do not bring valuables to the hospital. [place].  Contacts, dentures or bridgework may not be worn into surgery.  Leave suitcase in the car. After surgery it may be brought to your room.     Patients discharged the day of surgery will not be allowed to drive home.  Name and phone number of your driver:wife laurel cel  [phone]  Special Instructions: N/A              Please read over the following fact sheets you were given: _____________________________________________________________________             [facility] - Preparing for Surgery Before surgery, you can play an important role.  Because skin is not sterile, your skin needs to be as free of germs as possible.  You can reduce the number of germs on your skin by washing with CHG (chlorahexidine gluconate) soap before surgery.  CHG is an antiseptic cleaner which kills germs and bonds with the skin to continue killing germs even after washing. Please DO NOT use if you have an allergy to CHG or antibacterial soaps.  If your skin becomes reddened/irritated stop using the CHG and inform your nurse when you arrive at Short Stay. Do not shave (including legs and underarms) for at least 48 hours prior to the first CHG shower.  You may shave your face/neck. Please follow these instructions carefully:  1.  Shower with CHG Soap the night before surgery and the  morning of Surgery.  2.  If you choose to wash your hair,  wash your hair first as usual with your  normal  shampoo.  3.  After you shampoo, rinse your hair and body thoroughly to remove the   shampoo.                           4.  Use CHG as you would any other liquid soap.  You can apply chg directly  to the skin and wash                       Gently with a scrungie or clean washcloth.  5.  Apply the CHG Soap to your body ONLY FROM THE NECK DOWN.   Do not use on face/ open                           Wound or open sores. Avoid contact with eyes, ears mouth and genitals (private parts).                       Wash face,  Genitals (private parts) with your normal soap.             6.  Wash thoroughly, paying special attention to the area where your surgery  will be performed.  7.  Thoroughly rinse your body with warm water from the neck down.  8.  DO NOT shower/wash with your normal soap after using and rinsing off  the CHG Soap.                9.  Pat yourself dry with a clean towel.            10.  Wear clean pajamas.            11.  Place clean sheets on your bed the night of your first shower and do not  sleep with pets. Day of Surgery : Do not apply any lotions/deodorants the morning of surgery.  Please wear clean clothes to the hospital/surgery center.  FAILURE TO FOLLOW THESE INSTRUCTIONS MAY RESULT IN THE CANCELLATION OF YOUR SURGERY PATIENT SIGNATURE_________________________________  NURSE SIGNATURE__________________________________  ________________________________________________________________________

## 2016-01-17 ENCOUNTER — Encounter (HOSPITAL_COMMUNITY)
Admission: RE | Admit: 2016-01-17 | Discharge: 2016-01-17 | Disposition: A | Discharge status: Home / Self Care | Payer: Medicare Other | Source: Ambulatory Visit | Attending: Urology | Admitting: Urology

## 2016-01-17 ENCOUNTER — Encounter (HOSPITAL_COMMUNITY): Payer: Self-pay

## 2016-01-17 DIAGNOSIS — Z01812 Encounter for preprocedural laboratory examination: Secondary | ICD-10-CM | POA: Diagnosis not present

## 2016-01-17 DIAGNOSIS — C61 Malignant neoplasm of prostate: Secondary | ICD-10-CM | POA: Insufficient documentation

## 2016-01-17 HISTORY — DX: Type 2 diabetes mellitus without complications: E11.9

## 2016-01-17 HISTORY — DX: Gastro-esophageal reflux disease without esophagitis: K21.9

## 2016-01-17 HISTORY — DX: Unspecified osteoarthritis, unspecified site: M19.90

## 2016-01-17 LAB — COMPREHENSIVE METABOLIC PANEL
ALBUMIN: 4 g/dL (ref 3.5–5.0)
ALT: 27 U/L (ref 17–63)
AST: 34 U/L (ref 15–41)
Alkaline Phosphatase: 32 U/L — ABNORMAL LOW (ref 38–126)
Anion gap: 9 (ref 5–15)
BUN: 11 mg/dL (ref 6–20)
CALCIUM: 9.4 mg/dL (ref 8.9–10.3)
CO2: 25 mmol/L (ref 22–32)
Chloride: 102 mmol/L (ref 101–111)
Creatinine, Ser: 0.94 mg/dL (ref 0.61–1.24)
GFR calc Af Amer: >60 mL/min (ref >60)
GFR calc non Af Amer: >60 mL/min (ref >60)
GLUCOSE: 148 mg/dL — AB (ref 65–99)
POTASSIUM: 4.5 mmol/L (ref 3.5–5.1)
Sodium: 136 mmol/L (ref 135–145)
TOTAL PROTEIN: 6.8 g/dL (ref 6.5–8.1)
Total Bilirubin: 0.6 mg/dL (ref 0.3–1.2)

## 2016-01-17 LAB — GLUCOSE, CAPILLARY: Glucose-Capillary: 170 mg/dL — ABNORMAL HIGH (ref 65–99)

## 2016-01-17 NOTE — Progress Notes (Signed)
ekg 01-03-16 epic hemaglobin a1c, poct ua, ldl, lipid, cbc with dif, hepatic function, bmet 01-03-16 epic

## 2016-01-20 NOTE — H&P (Signed)
    History of Present Illness [name] is a 65 year old with the following urologic history: 1) Prostate cancer: He was noted to have an increase in his PSA from 1.3 in November 2008 up to 3.04 in May 2011. His PSA was rechecked when I first evaluated him in June 2011 and remained elevated at 3.53. 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 He has no family history of prostate cancer. He has been thoroughly counseled by myself and Dr. Tammi Klippel about his treatment options and elected active surveillance.  Initial diagnosis: July 2011 TNM stage: cT1c Nx Mx PSA at diagnosis: 3.53 Gleason score: 3+3=6 Biopsy ( 09/04/09): 2/12 cores -- L mid (10%), R lateral mid (5%) Prostate volume: 37.7 cc PSAD: 0.09 Survillance: Jan 2012: MRI -- L medial apical nodule most suspicious area, No EPE, SVI, or LAD Jan 2012: 4/22 cores positive -- L lateral apex (<5%, 3+3=6), L mid (2/2 cores, 25% and 5%, 3+3=6, PNI) L lateral mid (<5%, 3+3=6), Vol 38.0 cc Aug 2013: 1/12 cores positive -- L mid (< 5%), multifocal HGPIN, Vol 39.5 cc Sep 2015: 2/12 cores -- L mid (10%, 3+3=6, PNI), R lateral base (< 5%, 3+3=6), Vol 33.1 cc Baseline urinary function: IPSS: 15. He has very little bother from his symptoms which include a weak stream, urgency, and nocturia. Baseline erectile function: SHIM score 16. He was prescribed Cialis 10 mg prn.  Past Medical History Problems  1. History of Heartburn With Regurgitation 2. History of hyperlipidemia (Z86.39) 3. History of hypertension (Z86.79) 4. Prostate cancer (C61) Surgical History Problems  1. History of Elbow Arthroscopy Current Meds 1. Quinapril HCl - 40 MG Oral Tablet; Therapy: (Recorded:10Jun2015) to Recorded Allergies Medication  1. Codeine Derivatives Family History Problems  1. Family history of Nephrolithiasis : Father 2. Denied: Family history of Prostate  Cancer Social History Problems  1. Alcohol Use (History) 2. Former smoker [phone]  3 ppd. Quit in 2009. 3. Marital History - Currently Married Vitals Weight: 196 lb  BMI Calculated: 28.12 BSA Calculated: 2.07  Physical Exam Constitutional: Well nourished and well developed . No acute distress.

## 2016-01-21 ENCOUNTER — Encounter (HOSPITAL_COMMUNITY): Payer: Self-pay

## 2016-01-21 ENCOUNTER — Ambulatory Visit (HOSPITAL_COMMUNITY): Admission: RE | Admit: 2016-01-21 | Payer: Medicare Other | Source: Ambulatory Visit | Admitting: Urology

## 2016-01-21 ENCOUNTER — Ambulatory Visit (HOSPITAL_COMMUNITY): Payer: Medicare Other | Admitting: Certified Registered"

## 2016-01-21 ENCOUNTER — Encounter (HOSPITAL_COMMUNITY)
Admission: RE | Disposition: A | Discharge status: Home / Self Care | Payer: Self-pay | Source: Ambulatory Visit | Attending: Urology

## 2016-01-21 ENCOUNTER — Ambulatory Visit (HOSPITAL_COMMUNITY): Payer: Medicare Other

## 2016-01-21 ENCOUNTER — Ambulatory Visit (HOSPITAL_COMMUNITY)
Admission: RE | Admit: 2016-01-21 | Discharge: 2016-01-21 | Disposition: A | Discharge status: Home / Self Care | Payer: Medicare Other | Source: Ambulatory Visit | Attending: Urology | Admitting: Urology

## 2016-01-21 DIAGNOSIS — E119 Type 2 diabetes mellitus without complications: Secondary | ICD-10-CM | POA: Diagnosis not present

## 2016-01-21 DIAGNOSIS — E785 Hyperlipidemia, unspecified: Secondary | ICD-10-CM | POA: Diagnosis not present

## 2016-01-21 DIAGNOSIS — I10 Essential (primary) hypertension: Secondary | ICD-10-CM | POA: Diagnosis not present

## 2016-01-21 DIAGNOSIS — C61 Malignant neoplasm of prostate: Secondary | ICD-10-CM

## 2016-01-21 DIAGNOSIS — Z87891 Personal history of nicotine dependence: Secondary | ICD-10-CM | POA: Diagnosis not present

## 2016-01-21 HISTORY — PX: PROSTATE BIOPSY: SHX241

## 2016-01-21 LAB — GLUCOSE, CAPILLARY
GLUCOSE-CAPILLARY: 110 mg/dL — AB (ref 65–99)
GLUCOSE-CAPILLARY: 117 mg/dL — AB (ref 65–99)

## 2016-01-21 SURGERY — BIOPSY, PROSTATE, RECTAL APPROACH, WITH US GUIDANCE
Anesthesia: General | Site: Prostate

## 2016-01-21 MED ORDER — FENTANYL CITRATE (PF) 100 MCG/2ML IJ SOLN
INTRAMUSCULAR | Status: DC | PRN
  Administered 2016-01-21: 50 ug via INTRAVENOUS

## 2016-01-21 MED ORDER — MIDAZOLAM HCL 5 MG/5ML IJ SOLN
INTRAMUSCULAR | Status: DC | PRN
  Administered 2016-01-21: 2 mg via INTRAVENOUS

## 2016-01-21 MED ORDER — FENTANYL CITRATE (PF) 100 MCG/2ML IJ SOLN
INTRAMUSCULAR | Status: AC
  Filled 2016-01-21: qty 2

## 2016-01-21 MED ORDER — LACTATED RINGERS IV SOLN
INTRAVENOUS | Status: DC | PRN
  Administered 2016-01-21: 14:00:00 via INTRAVENOUS

## 2016-01-21 MED ORDER — OXYCODONE HCL 5 MG/5ML PO SOLN
5.0000 mg | Freq: Once | ORAL | Status: DC | PRN
  Filled 2016-01-21: qty 5

## 2016-01-21 MED ORDER — PROPOFOL 10 MG/ML IV BOLUS
INTRAVENOUS | Status: DC | PRN
  Administered 2016-01-21: 200 mg via INTRAVENOUS

## 2016-01-21 MED ORDER — OXYCODONE HCL 5 MG PO TABS: 5.0000 mg | ORAL_TABLET | Freq: Once | ORAL | Status: DC | PRN

## 2016-01-21 MED ORDER — DEXTROSE 5 % IV SOLN
2.0000 g | INTRAVENOUS | Status: AC
  Administered 2016-01-21: 2 g via INTRAVENOUS
  Filled 2016-01-21 (×2): qty 2

## 2016-01-21 MED ORDER — BELLADONNA ALKALOIDS-OPIUM 16.2-60 MG RE SUPP
RECTAL | Status: AC
  Filled 2016-01-21: qty 1

## 2016-01-21 MED ORDER — ONDANSETRON HCL 4 MG/2ML IJ SOLN
INTRAMUSCULAR | Status: AC
  Filled 2016-01-21: qty 2

## 2016-01-21 MED ORDER — PROPOFOL 10 MG/ML IV BOLUS
INTRAVENOUS | Status: AC
  Filled 2016-01-21: qty 20

## 2016-01-21 MED ORDER — FENTANYL CITRATE (PF) 100 MCG/2ML IJ SOLN: 25.0000 ug | INTRAMUSCULAR | Status: DC | PRN

## 2016-01-21 MED ORDER — MIDAZOLAM HCL 2 MG/2ML IJ SOLN
INTRAMUSCULAR | Status: AC
  Filled 2016-01-21: qty 2

## 2016-01-21 MED ORDER — ONDANSETRON HCL 4 MG/2ML IJ SOLN
4.0000 mg | Freq: Four times a day (QID) | INTRAMUSCULAR | Status: DC | PRN
Start: 2016-01-21 — End: 2016-01-21

## 2016-01-21 MED ORDER — FLEET ENEMA 7-19 GM/118ML RE ENEM: 1.0000 | ENEMA | Freq: Once | RECTAL | Status: DC

## 2016-01-21 MED ORDER — ONDANSETRON HCL 4 MG/2ML IJ SOLN
INTRAMUSCULAR | Status: DC | PRN
  Administered 2016-01-21: 4 mg via INTRAVENOUS

## 2016-01-21 MED ORDER — LIDOCAINE HCL 2 % EX GEL
CUTANEOUS | Status: AC
  Filled 2016-01-21: qty 10

## 2016-01-21 SURGICAL SUPPLY — 4 items
INST BIOPSY MAXCORE 18GX25 (NEEDLE) ×3 IMPLANT
INSTR BIOPSY MAXCORE 18GX20 (NEEDLE) IMPLANT
SYR CONTROL 10ML LL (SYRINGE) IMPLANT
UNDERPAD 30X30 INCONTINENT (UNDERPADS AND DIAPERS) ×3 IMPLANT

## 2016-01-21 NOTE — Anesthesia Preprocedure Evaluation (Addendum)
Anesthesia Evaluation  Patient identified by MRN, date of birth, ID band Patient awake    Reviewed: Allergy & Precautions, H&P , NPO status , Patient's Chart, lab work & pertinent test results  Airway Mallampati: II   Neck ROM: full    Dental   Pulmonary former smoker,    breath sounds clear to auscultation       Cardiovascular hypertension,  Rhythm:regular Rate:Normal     Neuro/Psych    GI/Hepatic GERD  ,  Endo/Other  diabetes, Type 2  Renal/GU      Musculoskeletal  (+) Arthritis ,   Abdominal   Peds  Hematology   Anesthesia Other Findings   Reproductive/Obstetrics                             Anesthesia Physical Anesthesia Plan  ASA: II  Anesthesia Plan: General   Post-op Pain Management:    Induction: Intravenous  Airway Management Planned: LMA  Additional Equipment:   Intra-op Plan:   Post-operative Plan:   Informed Consent: I have reviewed the patients History and Physical, chart, labs and discussed the procedure including the risks, benefits and alternatives for the proposed anesthesia with the patient or authorized representative who has indicated his/her understanding and acceptance.     Plan Discussed with: CRNA, Anesthesiologist and Surgeon  Anesthesia Plan Comments:         Anesthesia Quick Evaluation

## 2016-01-21 NOTE — Transfer of Care (Signed)
Immediate Anesthesia Transfer of Care Note  Patient: James Edwards  Procedure(s) Performed: Procedure(s): BIOPSY TRANSRECTAL ULTRASONIC PROSTATE (TUBP) (N/A)  Patient Location: PACU  Anesthesia Type:General  Level of Consciousness: awake, alert  and oriented  Airway & Oxygen Therapy: Patient Spontanous Breathing and Patient connected to face mask oxygen  Post-op Assessment: Report given to RN and Post -op Vital signs reviewed and stable  Post vital signs: Reviewed and stable  Last Vitals:  Vitals:   01/21/16 1335  BP: (!) 141/80  Pulse: 65  Resp: 18  Temp: 36.7 C    Last Pain:  Vitals:   01/21/16 1335  TempSrc: Oral         Complications: No apparent anesthesia complications

## 2016-01-21 NOTE — Op Note (Signed)
Preoperative diagnosis:  1. Prostate cancer  Postoperative diagnosis: 1. Prostate cancer  Procedure(s): 1. Transrectal ultrasound-guided prostate needle biopsy  Surgeon: Dr. Roxy Horseman, Jr  Anesthesia: Sedation  Complications: None  EBL: Minimal  Specimens: 1. Right lateral base 2.  Right base 3.  Right lateral mid 4.  Right mid  5.  Right lateral apex 6.  Right apex 7.  Left lateral base 8.  Left base 9.  Left lateral mid 10.  Left mid 11. Left lateral apex 12. Left apex  Disposition of specimens: Pathology  Indication: [name] is a 65 year old gentleman with low risk prostate cancer.  He presents today for a surveillance protocol biopsy.  He was unable to tolerate a recent attempt at a biopsy in the office due to a tight anal sphincter and significant pain with probe insertion.  We have reviewed the potential risks and complications of a prostate needle biopsy in detail.  He gives informed consent to proceed.  Description of procedure:  The patient was taken to the operating room and IV sedation was administered.  He was placed in the left lateral decubitus position and administered preoperative ceftriaxone.  Preoperative timeout was performed.  The transrectal ultrasound probe was then placed into the rectum and the prostate was visualized.  The prostate measured 31 cc. There was no evidence of any abnormalities that raise concern for obvious malignancy on ultrasound.  The prostate was homogeneous.  A 12 core biopsy was obtained in a standard sextant fashion on the lateral and parasagittal regions of the base, mid, and apex areas of the right and left prostate gland. All of the biopsy specimens were placed in formalin and sent for permanent pathologic analysis.  Following the procedure, a rectal exam was performed and demonstrated no evidence of significant bleeding.  The patient was able to be safely transferred to the recovery unit in satisfactory condition.   No complications were noted.

## 2016-01-21 NOTE — Anesthesia Postprocedure Evaluation (Signed)
Anesthesia Post Note  Patient: James Edwards  Procedure(s) Performed: Procedure(s) (LRB): BIOPSY TRANSRECTAL ULTRASONIC PROSTATE (TUBP) (N/A)  Patient location during evaluation: PACU Anesthesia Type: General Level of consciousness: awake and alert Pain management: pain level controlled Vital Signs Assessment: post-procedure vital signs reviewed and stable Respiratory status: spontaneous breathing, nonlabored ventilation, respiratory function stable and patient connected to nasal cannula oxygen Cardiovascular status: blood pressure returned to baseline and stable Postop Assessment: no signs of nausea or vomiting Anesthetic complications: no    Last Vitals:  Vitals:   01/21/16 1545 01/21/16 1551  BP: 126/69 134/73  Pulse: 62 60  Resp: 17 16  Temp: 37 C 36.6 C    Last Pain:  Vitals:   01/21/16 1545  TempSrc:   PainSc: 0-No pain                 Catalina Gravel

## 2016-01-21 NOTE — Discharge Instructions (Signed)
It will be normal to experience some blood in the urine or from the rectum for 2-3 days.  Please wait until the blood has stopped before starting back your meloxicam or other supplements.  Please call if you develop fever > 101 or excessive bleeding issues.

## 2016-01-21 NOTE — Anesthesia Procedure Notes (Signed)
Procedure Name: LMA Insertion Date/Time: 01/21/2016 2:54 PM Performed by: Glory Buff Pre-anesthesia Checklist: Patient identified, Emergency Drugs available, Suction available and Patient being monitored Patient Re-evaluated:Patient Re-evaluated prior to inductionOxygen Delivery Method: Circle system utilized Preoxygenation: Pre-oxygenation with 100% oxygen Intubation Type: IV induction LMA: LMA inserted LMA Size: 4.0 Number of attempts: 1 Placement Confirmation: positive ETCO2 Tube secured with: Tape Dental Injury: Teeth and Oropharynx as per pre-operative assessment

## 2016-01-22 ENCOUNTER — Encounter (HOSPITAL_COMMUNITY): Payer: Self-pay | Admitting: Urology

## 2016-01-29 ENCOUNTER — Encounter: Payer: Medicare Other | Attending: Adult Health | Admitting: Skilled Nursing Facility1

## 2016-01-29 ENCOUNTER — Encounter: Payer: Self-pay | Admitting: Skilled Nursing Facility1

## 2016-01-29 DIAGNOSIS — E119 Type 2 diabetes mellitus without complications: Secondary | ICD-10-CM | POA: Insufficient documentation

## 2016-01-29 DIAGNOSIS — Z713 Dietary counseling and surveillance: Secondary | ICD-10-CM | POA: Diagnosis not present

## 2016-01-30 NOTE — Progress Notes (Signed)
Diabetes Self-Management Education  Visit Type: First/Initial  01/30/2016  James Edwards, identified by name and date of birth, is a 65 y.o. male with a diagnosis of Diabetes: Type 2.   ASSESSMENT  There were no vitals taken for this visit. There is no height or weight on file to calculate BMI.      Diabetes Self-Management Education - 01/30/16 0859      Visit Information   Visit Type First/Initial     Initial Visit   Diabetes Type Type 2   Are you currently following a meal plan? No   Are you taking your medications as prescribed? Not on Medications     Health Coping   How would you rate your overall health? Good     Psychosocial Assessment   Patient Belief/Attitude about Diabetes Motivated to manage diabetes     Pre-Education Assessment   Patient understands the diabetes disease and treatment process. Needs Instruction   Patient understands incorporating nutritional management into lifestyle. Needs Instruction   Patient undertands incorporating physical activity into lifestyle. Needs Instruction   Patient understands using medications safely. Needs Instruction   Patient understands monitoring blood glucose, interpreting and using results Needs Instruction   Patient understands prevention, detection, and treatment of acute complications. Needs Instruction   Patient understands prevention, detection, and treatment of chronic complications. Needs Instruction   Patient understands how to develop strategies to address psychosocial issues. Needs Instruction   Patient understands how to develop strategies to promote health/change behavior. Needs Instruction     Complications   Last HgB A1C per patient/outside source 6.6 %   Have you had a dilated eye exam in the past 12 months? Yes   Have you had a dental exam in the past 12 months? No   Are you checking your feet? Yes   How many days per week are you checking your feet? 6     Dietary Intake   Breakfast none   Lunch soup-----beans   Dinner meat, potatoes     Exercise   Exercise Type ADL's     Patient Education   Previous Diabetes Education No   Disease state  Factors that contribute to the development of diabetes   Nutrition management  Role of diet in the treatment of diabetes and the relationship between the three main macronutrients and blood glucose level;Food label reading, portion sizes and measuring food.;Carbohydrate counting;Meal timing in regards to the patients' current diabetes medication.;Reviewed blood glucose goals for pre and post meals and how to evaluate the patients' food intake on their blood glucose level.;Information on hints to eating out and maintain blood glucose control.   Physical activity and exercise  Role of exercise on diabetes management, blood pressure control and cardiac health.   Monitoring Daily foot exams;Identified appropriate SMBG and/or A1C goals.;Yearly dilated eye exam;Interpreting lab values - A1C, lipid, urine microalbumina.     Individualized Goals (developed by patient)   Physical Activity Exercise 1-2 times per week;15 minutes per day     Post-Education Assessment   Patient understands the diabetes disease and treatment process. Demonstrates understanding / competency   Patient understands incorporating nutritional management into lifestyle. Demonstrates understanding / competency   Patient undertands incorporating physical activity into lifestyle. Demonstrates understanding / competency   Patient understands using medications safely. Demonstrates understanding / competency   Patient understands monitoring blood glucose, interpreting and using results Demonstrates understanding / competency   Patient understands prevention, detection, and treatment of acute complications. Demonstrates understanding /  competency   Patient understands prevention, detection, and treatment of chronic complications. Demonstrates understanding / competency   Patient  understands how to develop strategies to address psychosocial issues. Demonstrates understanding / competency   Patient understands how to develop strategies to promote health/change behavior. Demonstrates understanding / competency     Outcomes   Expected Outcomes Demonstrated interest in learning. Expect positive outcomes   Future DMSE PRN   Program Status Completed      Individualized Plan for Diabetes Self-Management Training:   Learning Objective:  Patient will have a greater understanding of diabetes self-management. Patient education plan is to attend individual and/or group sessions per assessed needs and concerns.   Plan:   There are no Patient Instructions on file for this visit.  Expected Outcomes:  Demonstrated interest in learning. Expect positive outcomes  Education material provided: Living Well with Diabetes, Meal plan card and My Plate  If problems or questions, patient to contact team via:  Phone  Future DSME appointment: PRN

## 2016-02-01 ENCOUNTER — Other Ambulatory Visit: Payer: Self-pay | Admitting: Adult Health

## 2016-02-01 DIAGNOSIS — M1990 Unspecified osteoarthritis, unspecified site: Secondary | ICD-10-CM

## 2016-02-01 NOTE — Telephone Encounter (Signed)
Ok to refill 

## 2016-02-04 ENCOUNTER — Encounter: Payer: Self-pay | Admitting: Adult Health

## 2016-03-03 ENCOUNTER — Other Ambulatory Visit: Payer: Self-pay | Admitting: Adult Health

## 2016-03-03 DIAGNOSIS — M1990 Unspecified osteoarthritis, unspecified site: Secondary | ICD-10-CM

## 2016-03-04 NOTE — Telephone Encounter (Signed)
Ok to refill for 90 +1 

## 2016-04-03 ENCOUNTER — Other Ambulatory Visit (INDEPENDENT_AMBULATORY_CARE_PROVIDER_SITE_OTHER): Payer: Medicare Other

## 2016-04-03 DIAGNOSIS — E119 Type 2 diabetes mellitus without complications: Secondary | ICD-10-CM | POA: Diagnosis not present

## 2016-04-03 DIAGNOSIS — E785 Hyperlipidemia, unspecified: Secondary | ICD-10-CM

## 2016-04-03 LAB — LIPID PANEL
CHOLESTEROL: 190 mg/dL (ref 0–200)
HDL: 72.4 mg/dL (ref >39.00)
LDL Cholesterol: 102 mg/dL — ABNORMAL HIGH (ref 0–99)
NONHDL: 117.35
Total CHOL/HDL Ratio: 3
Triglycerides: 76 mg/dL (ref 0.0–149.0)
VLDL: 15.2 mg/dL (ref 0.0–40.0)

## 2016-04-03 LAB — HEMOGLOBIN A1C: HEMOGLOBIN A1C: 6.2 % (ref 4.6–6.5)

## 2016-07-17 DIAGNOSIS — C61 Malignant neoplasm of prostate: Secondary | ICD-10-CM | POA: Diagnosis not present

## 2016-07-29 DIAGNOSIS — C61 Malignant neoplasm of prostate: Secondary | ICD-10-CM | POA: Diagnosis not present

## 2016-07-29 DIAGNOSIS — R3915 Urgency of urination: Secondary | ICD-10-CM | POA: Diagnosis not present

## 2016-07-29 DIAGNOSIS — N401 Enlarged prostate with lower urinary tract symptoms: Secondary | ICD-10-CM | POA: Diagnosis not present

## 2016-07-29 DIAGNOSIS — R351 Nocturia: Secondary | ICD-10-CM | POA: Diagnosis not present

## 2016-07-31 DIAGNOSIS — H2513 Age-related nuclear cataract, bilateral: Secondary | ICD-10-CM | POA: Diagnosis not present

## 2016-08-15 NOTE — Progress Notes (Signed)
Subjective:   James Edwards is a 66 y.o. male who presents for an Initial Medicare Annual Wellness Visit.  Review of Systems  No ROS.  Medicare Wellness Visit. Additional risk factors are reflected in the social history.  Cardiac Risk Factors include: advanced age (>12men, >39 women);family history of premature cardiovascular disease;male gender;hypertension   Sleep patterns: Sleeps 5-6 hours, feels rested. Up to void x 2.  Home Safety/Smoke Alarms: Feels safe in home. Smoke alarms in place.  Living environment; residence and Firearm Safety: Lives with wife in 2 story home.  Seat Belt Safety/Bike Helmet: Wears seat belt.   Counseling:   Eye Exam-Last exam 06/2016, every 9 months. [place] in [place] exam > 5 years. Only with issues.   Male:   [facility]-Colonoscopy 02/18/2004. GI referral placed.      PSA-Followed by Urology. 07/18/2016, 3.520.      Objective:    Today's Vitals   08/18/16 0807  BP: (!) 142/64  Pulse: (!) 57  SpO2: 98%  Weight: 198 lb 11.2 oz (90.1 kg)  Height: 5\' 10"  (1.778 m)   Body mass index is 28.51 kg/m.  Current Medications (verified) Outpatient Encounter Prescriptions as of 08/18/2016  Medication Sig  . meloxicam (MOBIC) 7.5 MG tablet TAKE ONE TABLET BY MOUTH ONCE DAILY  . Multiple Vitamin (MULTIVITAMIN) tablet Take 1 tablet by mouth daily.  . quinapril (ACCUPRIL) 40 MG tablet Take 1 tablet (40 mg total) by mouth daily.  . solifenacin (VESICARE) 5 MG tablet Take 5 mg by mouth daily.  . TURMERIC PO Take by mouth.   No facility-administered encounter medications on file as of 08/18/2016.     Allergies (verified) Codeine   History: Past Medical History:  Diagnosis Date  . Arthritis   . Diabetes mellitus without complication ([place]) dx 35-70-17   type 2 diet controlled   . GERD (gastroesophageal reflux disease)   . Hypertension   . Prostate cancer [facility]) 2013   prostate   Past Surgical History:  Procedure  Laterality Date  . colonscopy     x 2  . ELBOW SURGERY Right 2007  . PROSTATE BIOPSY     x 3  . PROSTATE BIOPSY N/A 01/21/2016   Procedure: BIOPSY TRANSRECTAL ULTRASONIC PROSTATE (TUBP);  Surgeon: Raynelle Bring, MD;  Location: [facility];  Service: Urology;  Laterality: N/A;   Family History  Problem Relation Age of Onset  . Arthritis Father   . Heart disease Father   . Hypertension Father   . Bladder Cancer Father   . Heart disease Mother   . Hypertension Mother   . Bladder Cancer Mother   . Glaucoma Mother    Social History   Occupational History  . Not on file.   Social History Main Topics  . Smoking status: Former Research scientist (life sciences)  . Smokeless tobacco: Never Used     Comment: per pt stopped in 2006   . Alcohol use 0.0 oz/week     Comment: 10 beers per day   . Drug use: No  . Sexual activity: Not on file   Tobacco Counseling Counseling given: No   Activities of Daily Living In your present state of health, do you have any difficulty performing the following activities: 08/18/2016 01/21/2016  Hearing? N N  Vision? N N  Difficulty concentrating or making decisions? N N  Walking or climbing stairs? N N  Dressing or bathing? N N  Doing errands, shopping? N -  Conservation officer, nature and  eating ? N -  Using the Toilet? N -  In the past six months, have you accidently leaked urine? N -  Do you have problems with loss of bowel control? N -  Managing your Medications? N -  Managing your Finances? N -  Housekeeping or managing your Housekeeping? N -  Some recent data might be hidden    Immunizations and Health Maintenance Immunization History  Administered Date(s) Administered  . Influenza, High Dose Seasonal PF 10/18/2014, 10/24/2015  . Pneumococcal Conjugate-13 10/18/2014  . Pneumococcal Polysaccharide-23 10/24/2015   Health Maintenance Due  Topic Date Due  . Hepatitis C Screening  [birth year]  . FOOT EXAM  09/20/1959  . OPHTHALMOLOGY EXAM  09/20/1959  . TETANUS/TDAP  09/19/1968    . COLONOSCOPY  02/17/2014    Patient Care Team: Dorothyann Peng, NP as PCP - General (Family Medicine) [facility], [facility]  Indicate any recent Medical Services you may have received from other than [facility] providers in the past year (date may be approximate).    Assessment:   This is a routine wellness examination for James Edwards. Physical assessment deferred to PCP.   Hearing/Vision screen Hearing Screening Comments: Able to hear conversational tones w/o difficulty. No issues reported.   Vision Screening Comments: Wears glasses.   Dietary issues and exercise activities discussed: Current Exercise Habits: Home exercise routine, Type of exercise: walking, Time (Minutes): 35, Frequency (Times/Week): 7, Weekly Exercise (Minutes/Week): 245, Exercise limited by: None identified   Diet (meal preparation, eat out, water intake, caffeinated beverages, dairy products, fruits and vegetables): Drinks water, light beer daily.   Breakfast: skips, coffee Lunch: sandwich, soup Dinner: protein, vegetables.      Discussed heart healthy diet and not skipping meals. Encouraged to remain active.   Goals      Patient Stated   . Patient states (pt-stated)          Maintain current health.       Depression Screen PHQ 2/9 Scores 08/18/2016 01/03/2016 10/18/2014 10/18/2014  PHQ - 2 Score 0 0 0 0    Fall Risk Fall Risk  08/18/2016 01/03/2016 10/18/2014 10/18/2014  Falls in the past year? Yes No No No  Number falls in past yr: 1 - - -  Injury with Fall? No - - -  Follow up Falls prevention discussed - - -    Cognitive Function:       Ad8 score reviewed for issues:  Issues making decisions: no  Less interest in hobbies / activities: no  Repeats questions, stories (family complaining): no  Trouble using ordinary gadgets (microwave, computer, phone): no  Forgets the month or year:  no  Mismanaging finances: no  Remembering appts: no  Daily problems with thinking and/or memory:  no Ad8 score is=0     Screening Tests Health Maintenance  Topic Date Due  . Hepatitis C Screening  [birth year]  . FOOT EXAM  09/20/1959  . OPHTHALMOLOGY EXAM  09/20/1959  . TETANUS/TDAP  09/19/1968  . COLONOSCOPY  02/17/2014  . INFLUENZA VACCINE  09/17/2016  . HEMOGLOBIN A1C  10/01/2016  . PNA vac Low Risk Adult  Completed        Plan:    Schedule Colonoscopy   Bring a copy of your advance directives to your next office visit.  Continue doing brain stimulating activities (puzzles, reading, adult coloring books, staying active) to keep memory sharp.    I have personally reviewed and noted the following in the patient's chart:   . Medical  and social history . Use of alcohol, tobacco or illicit drugs  . Current medications and supplements . Functional ability and status . Nutritional status . Physical activity . Advanced directives . List of other physicians . Hospitalizations, surgeries, and ER visits in previous 12 months . Vitals . Screenings to include cognitive, depression, and falls . Referrals and appointments  In addition, I have reviewed and discussed with patient certain preventive protocols, quality metrics, and best practice recommendations. A written personalized care plan for preventive services as well as general preventive health recommendations were provided to patient.     Gerilyn Nestle, RN   08/18/2016

## 2016-08-18 ENCOUNTER — Ambulatory Visit (INDEPENDENT_AMBULATORY_CARE_PROVIDER_SITE_OTHER): Payer: Medicare Other

## 2016-08-18 VITALS — BP 142/64 | HR 57 | Ht 70.0 in | Wt 198.7 lb

## 2016-08-18 DIAGNOSIS — Z1211 Encounter for screening for malignant neoplasm of colon: Secondary | ICD-10-CM

## 2016-08-18 DIAGNOSIS — Z0000 Encounter for general adult medical examination without abnormal findings: Secondary | ICD-10-CM | POA: Diagnosis not present

## 2016-08-18 NOTE — Patient Instructions (Addendum)
Schedule Colonoscopy   Bring a copy of your advance directives to your next office visit.  Continue doing brain stimulating activities (puzzles, reading, adult coloring books, staying active) to keep memory sharp.     Fall Prevention in the Home Falls can cause injuries. They can happen to people of all ages. There are many things you can do to make your home safe and to help prevent falls. What can I do on the outside of my home?  Regularly fix the edges of walkways and driveways and fix any cracks.  Remove anything that might make you trip as you walk through a door, such as a raised step or threshold.  Trim any bushes or trees on the path to your home.  Use bright outdoor lighting.  Clear any walking paths of anything that might make someone trip, such as rocks or tools.  Regularly check to see if handrails are loose or broken. Make sure that both sides of any steps have handrails.  Any raised decks and porches should have guardrails on the edges.  Have any leaves, snow, or ice cleared regularly.  Use sand or salt on walking paths during winter.  Clean up any spills in your garage right away. This includes oil or grease spills. What can I do in the bathroom?  Use night lights.  Install grab bars by the toilet and in the tub and shower. Do not use towel bars as grab bars.  Use non-skid mats or decals in the tub or shower.  If you need to sit down in the shower, use a plastic, non-slip stool.  Keep the floor dry. Clean up any water that spills on the floor as soon as it happens.  Remove soap buildup in the tub or shower regularly.  Attach bath mats securely with double-sided non-slip rug tape.  Do not have throw rugs and other things on the floor that can make you trip. What can I do in the bedroom?  Use night lights.  Make sure that you have a light by your bed that is easy to reach.  Do not use any sheets or blankets that are too big for your bed. They should  not hang down onto the floor.  Have a firm chair that has side arms. You can use this for support while you get dressed.  Do not have throw rugs and other things on the floor that can make you trip. What can I do in the kitchen?  Clean up any spills right away.  Avoid walking on wet floors.  Keep items that you use a lot in easy-to-reach places.  If you need to reach something above you, use a strong step stool that has a grab bar.  Keep electrical cords out of the way.  Do not use floor polish or wax that makes floors slippery. If you must use wax, use non-skid floor wax.  Do not have throw rugs and other things on the floor that can make you trip. What can I do with my stairs?  Do not leave any items on the stairs.  Make sure that there are handrails on both sides of the stairs and use them. Fix handrails that are broken or loose. Make sure that handrails are as long as the stairways.  Check any carpeting to make sure that it is firmly attached to the stairs. Fix any carpet that is loose or worn.  Avoid having throw rugs at the top or bottom of the stairs. If  you do have throw rugs, attach them to the floor with carpet tape.  Make sure that you have a light switch at the top of the stairs and the bottom of the stairs. If you do not have them, ask someone to add them for you. What else can I do to help prevent falls?  Wear shoes that: ? Do not have high heels. ? Have rubber bottoms. ? Are comfortable and fit you well. ? Are closed at the toe. Do not wear sandals.  If you use a stepladder: ? Make sure that it is fully opened. Do not climb a closed stepladder. ? Make sure that both sides of the stepladder are locked into place. ? Ask someone to hold it for you, if possible.  Clearly mark and make sure that you can see: ? Any grab bars or handrails. ? First and last steps. ? Where the edge of each step is.  Use tools that help you move around (mobility aids) if they are  needed. These include: ? Canes. ? Walkers. ? Scooters. ? Crutches.  Turn on the lights when you go into a dark area. Replace any light bulbs as soon as they burn out.  Set up your furniture so you have a clear path. Avoid moving your furniture around.  If any of your floors are uneven, fix them.  If there are any pets around you, be aware of where they are.  Review your medicines with your doctor. Some medicines can make you feel dizzy. This can increase your chance of falling. Ask your doctor what other things that you can do to help prevent falls. This information is not intended to replace advice given to you by your health care provider. Make sure you discuss any questions you have with your health care provider. Document Released: 11/30/2008 Document Revised: 07/12/2015 Document Reviewed: 03/10/2014 Elsevier Interactive Patient Education  2018 [employer] Maintenance, Male A healthy lifestyle and preventive care is important for your health and wellness. Ask your health care provider about what schedule of regular examinations is right for you. What should I know about weight and diet? Eat a Healthy Diet  Eat plenty of vegetables, fruits, whole grains, low-fat dairy products, and lean protein.  Do not eat a lot of foods high in solid fats, added sugars, or salt.  Maintain a Healthy Weight Regular exercise can help you achieve or maintain a healthy weight. You should:  Do at least 150 minutes of exercise each week. The exercise should increase your heart rate and make you sweat (moderate-intensity exercise).  Do strength-training exercises at least twice a week.  Watch Your Levels of Cholesterol and Blood Lipids  Have your blood tested for lipids and cholesterol every 5 years starting at 67 years of age. If you are at high risk for heart disease, you should start having your blood tested when you are 67 years old. You may need to have your cholesterol levels  checked more often if: ? Your lipid or cholesterol levels are high. ? You are older than 67 years of age. ? You are at high risk for heart disease.  What should I know about cancer screening? Many types of cancers can be detected early and may often be prevented. Lung Cancer  You should be screened every year for lung cancer if: ? You are a current smoker who has smoked for at least 30 years. ? You are a former smoker who has quit within the past  15 years.  Talk to your health care provider about your screening options, when you should start screening, and how often you should be screened.  Colorectal Cancer  Routine colorectal cancer screening usually begins at 67 years of age and should be repeated every 5-10 years until you are 67 years old. You may need to be screened more often if early forms of precancerous polyps or small growths are found. Your health care provider may recommend screening at an earlier age if you have risk factors for colon cancer.  Your health care provider may recommend using home test kits to check for hidden blood in the stool.  A small camera at the end of a tube can be used to examine your colon (sigmoidoscopy or colonoscopy). This checks for the earliest forms of colorectal cancer.  Prostate and Testicular Cancer  Depending on your age and overall health, your health care provider may do certain tests to screen for prostate and testicular cancer.  Talk to your health care provider about any symptoms or concerns you have about testicular or prostate cancer.  Skin Cancer  Check your skin from head to toe regularly.  Tell your health care provider about any new moles or changes in moles, especially if: ? There is a change in a mole's size, shape, or color. ? You have a mole that is larger than a pencil eraser.  Always use sunscreen. Apply sunscreen liberally and repeat throughout the day.  Protect yourself by wearing long sleeves, pants, a  wide-brimmed hat, and sunglasses when outside.  What should I know about heart disease, diabetes, and high blood pressure?  If you are 86-64 years of age, have your blood pressure checked every 3-5 years. If you are 46 years of age or older, have your blood pressure checked every year. You should have your blood pressure measured twice-once when you are at a hospital or clinic, and once when you are not at a hospital or clinic. Record the average of the two measurements. To check your blood pressure when you are not at a hospital or clinic, you can use: ? An automated blood pressure machine at a pharmacy. ? A home blood pressure monitor.  Talk to your health care provider about your target blood pressure.  If you are between 69-72 years old, ask your health care provider if you should take aspirin to prevent heart disease.  Have regular diabetes screenings by checking your fasting blood sugar level. ? If you are at a normal weight and have a low risk for diabetes, have this test once every three years after the age of 75. ? If you are overweight and have a high risk for diabetes, consider being tested at a younger age or more often.  A one-time screening for abdominal aortic aneurysm (AAA) by ultrasound is recommended for men aged 61-75 years who are current or former smokers. What should I know about preventing infection? Hepatitis B If you have a higher risk for hepatitis B, you should be screened for this virus. Talk with your health care provider to find out if you are at risk for hepatitis B infection. Hepatitis C Blood testing is recommended for:  Everyone born from [birth year] through 1966.  Anyone with known risk factors for hepatitis C.  Sexually Transmitted Diseases (STDs)  You should be screened each year for STDs including gonorrhea and chlamydia if: ? You are sexually active and are younger than 67 years of age. ? You are older than 67  years of age and your health care provider  tells you that you are at risk for this type of infection. ? Your sexual activity has changed since you were last screened and you are at an increased risk for chlamydia or gonorrhea. Ask your health care provider if you are at risk.  Talk with your health care provider about whether you are at high risk of being infected with HIV. Your health care provider may recommend a prescription medicine to help prevent HIV infection.  What else can I do?  Schedule regular health, dental, and eye exams.  Stay current with your vaccines (immunizations).  Do not use any tobacco products, such as cigarettes, chewing tobacco, and e-cigarettes. If you need help quitting, ask your health care provider.  Limit alcohol intake to no more than 2 drinks per day. One drink equals 12 ounces of beer, 5 ounces of wine, or 1 ounces of hard liquor.  Do not use street drugs.  Do not share needles.  Ask your health care provider for help if you need support or information about quitting drugs.  Tell your health care provider if you often feel depressed.  Tell your health care provider if you have ever been abused or do not feel safe at home. This information is not intended to replace advice given to you by your health care provider. Make sure you discuss any questions you have with your health care provider. Document Released: 08/02/2007 Document Revised: 10/03/2015 Document Reviewed: 11/07/2014 Elsevier Interactive Patient Education  [employer].

## 2016-08-18 NOTE — Progress Notes (Signed)
Reviewed and agree with above - PCP out of office today. Colin Benton R., DO

## 2016-08-27 ENCOUNTER — Other Ambulatory Visit: Payer: Self-pay | Admitting: Adult Health

## 2016-08-27 DIAGNOSIS — M1990 Unspecified osteoarthritis, unspecified site: Secondary | ICD-10-CM

## 2016-11-08 ENCOUNTER — Other Ambulatory Visit: Payer: Self-pay | Admitting: Adult Health

## 2016-11-08 DIAGNOSIS — I10 Essential (primary) hypertension: Secondary | ICD-10-CM

## 2016-11-12 NOTE — Telephone Encounter (Signed)
Sent to the pharmacy by e-scribe.  Spoke to the pt and scheduled him for cpx on 11/27/16.  Pt notified to come fasting and to arrive 15 minutes early for scheduling.

## 2016-11-27 ENCOUNTER — Ambulatory Visit: Payer: Medicare Other | Admitting: Adult Health

## 2016-11-27 NOTE — Progress Notes (Deleted)
   Subjective:    Patient ID: James Edwards, male    DOB: August 06, 1950, 66 y.o.   MRN: [id number]  HPI  Patient presents for yearly preventative medicine examination. He is a pleasant 66 year old male who  has a past medical history of Arthritis; Diabetes mellitus without complication ([place]) (dx 14-38-88); GERD (gastroesophageal reflux disease); Hypertension; and Prostate cancer ([place]) (2013).    All immunizations and health maintenance protocols were reviewed with the patient and needed orders were placed.  Appropriate screening laboratory values were ordered for the patient including screening of hyperlipidemia, renal function and hepatic function. If indicated by BPH, a PSA was ordered.  Medication reconciliation,  past medical history, social history, problem list and allergies were reviewed in detail with the patient  Goals were established with regard to weight loss, exercise, and  diet in compliance with medications  End of life planning was discussed.    Review of Systems     Objective:   Physical Exam        Assessment & Plan:

## 2016-12-17 DIAGNOSIS — Z23 Encounter for immunization: Secondary | ICD-10-CM | POA: Diagnosis not present

## 2016-12-22 ENCOUNTER — Encounter: Payer: Self-pay | Admitting: Adult Health

## 2017-01-02 ENCOUNTER — Ambulatory Visit (INDEPENDENT_AMBULATORY_CARE_PROVIDER_SITE_OTHER): Payer: Medicare Other | Admitting: Adult Health

## 2017-01-02 ENCOUNTER — Encounter: Payer: Self-pay | Admitting: Adult Health

## 2017-01-02 VITALS — BP 124/60 | Temp 98.2°F | Ht 68.5 in | Wt 198.0 lb

## 2017-01-02 DIAGNOSIS — G4700 Insomnia, unspecified: Secondary | ICD-10-CM | POA: Diagnosis not present

## 2017-01-02 DIAGNOSIS — E119 Type 2 diabetes mellitus without complications: Secondary | ICD-10-CM | POA: Diagnosis not present

## 2017-01-02 DIAGNOSIS — E782 Mixed hyperlipidemia: Secondary | ICD-10-CM | POA: Diagnosis not present

## 2017-01-02 DIAGNOSIS — I10 Essential (primary) hypertension: Secondary | ICD-10-CM | POA: Diagnosis not present

## 2017-01-02 DIAGNOSIS — E785 Hyperlipidemia, unspecified: Secondary | ICD-10-CM | POA: Insufficient documentation

## 2017-01-02 LAB — CBC WITH DIFFERENTIAL/PLATELET
BASOS ABS: 0.1 10*3/uL (ref 0.0–0.1)
Basophils Relative: 0.6 % (ref 0.0–3.0)
EOS ABS: 0.5 10*3/uL (ref 0.0–0.7)
Eosinophils Relative: 5.4 % — ABNORMAL HIGH (ref 0.0–5.0)
HEMATOCRIT: 43.9 % (ref 39.0–52.0)
HEMOGLOBIN: 15 g/dL (ref 13.0–17.0)
LYMPHS PCT: 20.7 % (ref 12.0–46.0)
Lymphs Abs: 1.9 10*3/uL (ref 0.7–4.0)
MCHC: 34.2 g/dL (ref 30.0–36.0)
MCV: 96.1 fl (ref 78.0–100.0)
MONOS PCT: 9.4 % (ref 3.0–12.0)
Monocytes Absolute: 0.9 10*3/uL (ref 0.1–1.0)
NEUTROS ABS: 5.8 10*3/uL (ref 1.4–7.7)
Neutrophils Relative %: 63.9 % (ref 43.0–77.0)
PLATELETS: 301 10*3/uL (ref 150.0–400.0)
RBC: 4.57 Mil/uL (ref 4.22–5.81)
RDW: 12.6 % (ref 11.5–15.5)
WBC: 9.1 10*3/uL (ref 4.0–10.5)

## 2017-01-02 LAB — LIPID PANEL
CHOLESTEROL: 190 mg/dL (ref 0–200)
HDL: 70.3 mg/dL (ref >39.00)
LDL CALC: 94 mg/dL (ref 0–99)
NonHDL: 119.61
Total CHOL/HDL Ratio: 3
Triglycerides: 129 mg/dL (ref 0.0–149.0)
VLDL: 25.8 mg/dL (ref 0.0–40.0)

## 2017-01-02 LAB — BASIC METABOLIC PANEL
BUN: 12 mg/dL (ref 6–23)
CO2: 26 meq/L (ref 19–32)
CREATININE: 0.92 mg/dL (ref 0.40–1.50)
Calcium: 10.5 mg/dL (ref 8.4–10.5)
Chloride: 97 mEq/L (ref 96–112)
GFR: 87.14 mL/min (ref >60.00)
Glucose, Bld: 129 mg/dL — ABNORMAL HIGH (ref 70–99)
Potassium: 4.5 mEq/L (ref 3.5–5.1)
SODIUM: 133 meq/L — AB (ref 135–145)

## 2017-01-02 LAB — HEPATIC FUNCTION PANEL
ALBUMIN: 4.2 g/dL (ref 3.5–5.2)
ALT: 18 U/L (ref 0–53)
AST: 21 U/L (ref 0–37)
Alkaline Phosphatase: 30 U/L — ABNORMAL LOW (ref 39–117)
BILIRUBIN TOTAL: 1 mg/dL (ref 0.2–1.2)
Bilirubin, Direct: 0.2 mg/dL (ref 0.0–0.3)
Total Protein: 7 g/dL (ref 6.0–8.3)

## 2017-01-02 LAB — HEMOGLOBIN A1C: Hgb A1c MFr Bld: 5.9 % (ref 4.6–6.5)

## 2017-01-02 LAB — TSH: TSH: 0.96 u[IU]/mL (ref 0.35–4.50)

## 2017-01-02 MED ORDER — TRAZODONE HCL 50 MG PO TABS: 50.0000 mg | ORAL_TABLET | Freq: Every evening | ORAL | 1 refills | Status: DC | PRN

## 2017-01-02 MED ORDER — QUINAPRIL HCL 40 MG PO TABS: 40.0000 mg | ORAL_TABLET | Freq: Every day | ORAL | 3 refills | Status: DC

## 2017-01-02 NOTE — Progress Notes (Signed)
Subjective:    Patient ID: James Edwards, male    DOB: Jan 18, 1951, 66 y.o.   MRN: [id number]  HPI Patient presents for yearly follow up exam. He is a pleasant 66 year old male who  has a past medical history of Arthritis, Diabetes mellitus without complication ([place]) (dx 57-32-20), GERD (gastroesophageal reflux disease), Hypertension, and Prostate cancer ([place]) (2013).   He reports that his blood pressure has been well controlled.. He has not had any blurred vision, headaches, or dizziness. He takes Accupril 40mg  tablet for hypertension   He is followed by [facility], Dr. Alinda Money and sees them every three months. He last had prostate biopsy in December 2017 and they were normal.  He does have a history of diabetes. This has been controlled by diet in the past.    All immunizations and health maintenance protocols were reviewed with the patient and needed orders were placed.  Appropriate screening laboratory values were ordered for the patient including screening of hyperlipidemia, renal function and hepatic function.  Medication reconciliation,  past medical history, social history, problem list and allergies were reviewed in detail with the patient  Goals were established with regard to weight loss, exercise, and  diet in compliance with medications. He reports that he has cut carbs by "drinking lite beer". He continues to walk about one mile per day   Wt Readings from Last 3 Encounters:  01/02/17 198 lb (89.8 kg)  08/18/16 198 lb 11.2 oz (90.1 kg)  01/17/16 195 lb 3.2 oz (88.5 kg)    End of life planning was discussed.   He is due for his colonoscopy but has been unable to get his records form [facility].   His biggest complaint is that of recurrent insomnia. He feels as though he has no problem falling asleep but he will often wake up around 2 am and not be able to fall asleep because " I just keep thinking about things and it makes me anxious   Review of Systems    Constitutional: Negative.   HENT: Negative.   Eyes: Negative.   Respiratory: Negative.   Cardiovascular: Negative.   Gastrointestinal: Negative.   Endocrine: Negative.   Genitourinary: Negative.   Musculoskeletal: Negative.   Skin: Negative.   Allergic/Immunologic: Negative.   Neurological: Negative.   Hematological: Negative.   Psychiatric/Behavioral: Negative.   All other systems reviewed and are negative.    Past Medical History:  Diagnosis Date  . Arthritis   . Diabetes mellitus without complication ([place]) dx 25-42-70   type 2 diet controlled   . GERD (gastroesophageal reflux disease)   . Hypertension   . Prostate cancer [facility]) 2013   prostate    Social History   Socioeconomic History  . Marital status: Married    Spouse name: Not on file  . Number of children: Not on file  . Years of education: Not on file  . Highest education level: Not on file  Social Needs  . Financial resource strain: Not on file  . Food insecurity - worry: Not on file  . Food insecurity - inability: Not on file  . Transportation needs - medical: Not on file  . Transportation needs - non-medical: Not on file  Occupational History  . Not on file  Tobacco Use  . Smoking status: Former Research scientist (life sciences)  . Smokeless tobacco: Never Used  . Tobacco comment: per pt stopped in 2006   Substance and Sexual Activity  . Alcohol use: Yes  Alcohol/week: 0.0 oz    Comment: 10 beers per day   . Drug use: No  . Sexual activity: Not on file  Other Topics Concern  . 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    Two Children who both live locally.       He likes to sleep.     Past Surgical History:  Procedure Laterality Date  . BIOPSY TRANSRECTAL ULTRASONIC PROSTATE (TUBP) N/A 01/21/2016   Performed by Raynelle Bring, MD at [facility] ORS  . colonscopy     x 2  . ELBOW SURGERY Right 2007  . PROSTATE BIOPSY     x 3    Family  History  Problem Relation Age of Onset  . Arthritis Father   . Heart disease Father   . Hypertension Father   . Bladder Cancer Father   . Heart disease Mother   . Hypertension Mother   . Bladder Cancer Mother   . Glaucoma Mother     Allergies  Allergen Reactions  . Codeine Nausea And Vomiting    Current Outpatient Medications on File Prior to Visit  Medication Sig Dispense Refill  . meloxicam (MOBIC) 7.5 MG tablet TAKE ONE TABLET BY MOUTH ONCE DAILY 90 tablet 1  . Multiple Vitamin (MULTIVITAMIN) tablet Take 1 tablet by mouth daily.    Marland Kitchen oxybutynin (DITROPAN-XL) 10 MG 24 hr tablet     . quinapril (ACCUPRIL) 40 MG tablet TAKE ONE TABLET BY MOUTH ONCE DAILY. 90 tablet 0  . TURMERIC PO Take by mouth.     No current facility-administered medications on file prior to visit.     BP 124/60 (BP Location: Left Arm)   Temp 98.2 F (36.8 C) (Oral)   Ht 5' 8.5" (1.74 m)   Wt 198 lb (89.8 kg)   BMI 29.67 kg/m       Objective:   Physical Exam  Constitutional: He is oriented to person, place, and time. He appears well-developed and well-nourished. No distress.  Obese   HENT:  Head: Normocephalic and atraumatic.  Right Ear: Hearing, tympanic membrane, external ear and ear canal normal.  Left Ear: Hearing, tympanic membrane, external ear and ear canal normal.  Nose: Nose normal.  Mouth/Throat: Uvula is midline, oropharynx is clear and moist and mucous membranes are normal. No oropharyngeal exudate.  Eyes: Conjunctivae and EOM are normal. Pupils are equal, round, and reactive to light. Right eye exhibits no discharge. Left eye exhibits no discharge. No scleral icterus.  Neck: Normal range of motion. Neck supple. No JVD present. No tracheal deviation present. No thyromegaly present.  Cardiovascular: Normal rate, regular rhythm, normal heart sounds and intact distal pulses. Exam reveals no gallop and no friction rub.  No murmur heard. Pulmonary/Chest: Effort normal and breath sounds  normal. No stridor. No respiratory distress. He has no wheezes. He has no rales. He exhibits no tenderness.  Abdominal: Soft. Bowel sounds are normal. He exhibits no distension and no mass. There is no tenderness. There is no rebound and no guarding.  Genitourinary:  Genitourinary Comments: Deferred: done by urology    Musculoskeletal: Normal range of motion. He exhibits no edema or tenderness.  Lymphadenopathy:    He has no cervical adenopathy.  Neurological: He is alert and oriented to person, place, and time. He has normal reflexes. He displays normal reflexes. No cranial nerve deficit. He exhibits normal muscle tone. Coordination  normal.  Skin: Skin is warm and dry. No rash noted. He is not diaphoretic. No erythema. No pallor.  Psychiatric: He has a normal mood and affect. His behavior is normal. Judgment and thought content normal.  Nursing note and vitals reviewed.     Assessment & Plan:  1. Essential hypertension - Well controlled on current medication. No change in therapy  - Basic metabolic panel - CBC with Differential/Platelet - Hemoglobin A1c - Hepatic function panel - Lipid panel - TSH - quinapril (ACCUPRIL) 40 MG tablet; Take 1 tablet (40 mg total) daily by mouth.  Dispense: 90 tablet; Refill: 3  2. Type 2 diabetes mellitus without complication, without long-term current use of insulin ([place]) - Consider metformin  - Encouraged to exercise and work on diet  - Basic metabolic panel - CBC with Differential/Platelet - Hemoglobin A1c - Hepatic function panel - Lipid panel - TSH  3. Mixed hyperlipidemia - Consider statin  - Basic metabolic panel - CBC with Differential/Platelet - Hemoglobin A1c - Hepatic function panel - Lipid panel - TSH  4. Insomnia, unspecified type - Will trial on Trazodone for insomnia  - traZODone (DESYREL) 50 MG tablet; Take 1 tablet (50 mg total) at bedtime as needed by mouth for sleep.  Dispense: 90 tablet; Refill: 1  Dorothyann Peng,  NP

## 2017-01-02 NOTE — Patient Instructions (Signed)
It was great seeing you today   I will follow up with you regarding your labs    Please call [facility] once you get your records from [place]   Phone: [phone]  Health Maintenance, Male A healthy lifestyle and preventative care can promote health and wellness.  Maintain regular health, dental, and eye exams.  Eat a healthy diet. Foods like vegetables, fruits, whole grains, low-fat dairy products, and lean protein foods contain the nutrients you need and are low in calories. Decrease your intake of foods high in solid fats, added sugars, and salt. Get information about a proper diet from your health care provider, if necessary.  Regular physical exercise is one of the most important things you can do for your health. Most adults should get at least 150 minutes of moderate-intensity exercise (any activity that increases your heart rate and causes you to sweat) each week. In addition, most adults need muscle-strengthening exercises on 2 or more days a week.   Maintain a healthy weight. The body mass index (BMI) is a screening tool to identify possible weight problems. It provides an estimate of body fat based on height and weight. Your health care provider can find your BMI and can help you achieve or maintain a healthy weight. For males 20 years and older:  A BMI below 18.5 is considered underweight.  A BMI of 18.5 to 24.9 is normal.  A BMI of 25 to 29.9 is considered overweight.  A BMI of 30 and above is considered obese.  Maintain normal blood lipids and cholesterol by exercising and minimizing your intake of saturated fat. Eat a balanced diet with plenty of fruits and vegetables. Blood tests for lipids and cholesterol should begin at age 49 and be repeated every 5 years. If your lipid or cholesterol levels are high, you are over age 50, or you are at high risk for heart disease, you may need your cholesterol levels checked more frequently.Ongoing high lipid and cholesterol levels  should be treated with medicines if diet and exercise are not working.  If you smoke, find out from your health care provider how to quit. If you do not use tobacco, do not start.  Lung cancer screening is recommended for adults aged 50-80 years who are at high risk for developing lung cancer because of a history of smoking. A yearly low-dose CT scan of the lungs is recommended for people who have at least a 30-pack-year history of smoking and are current smokers or have quit within the past 15 years. A pack year of smoking is smoking an average of 1 pack of cigarettes a day for 1 year (for example, a 30-pack-year history of smoking could mean smoking 1 pack a day for 30 years or 2 packs a day for 15 years). Yearly screening should continue until the smoker has stopped smoking for at least 15 years. Yearly screening should be stopped for people who develop a health problem that would prevent them from having lung cancer treatment.  If you choose to drink alcohol, do not have more than 2 drinks per day. One drink is considered to be 12 oz (360 mL) of beer, 5 oz (150 mL) of wine, or 1.5 oz (45 mL) of liquor.  Avoid the use of street drugs. Do not share needles with anyone. Ask for help if you need support or instructions about stopping the use of drugs.  High blood pressure causes heart disease and increases the risk of stroke. High blood  pressure is more likely to develop in:  People who have blood pressure in the end of the normal range (100-139/85-89 mm Hg).  People who are overweight or obese.  People who are African American.  If you are 43-87 years of age, have your blood pressure checked every 3-5 years. If you are 29 years of age or older, have your blood pressure checked every year. You should have your blood pressure measured twice--once when you are at a hospital or clinic, and once when you are not at a hospital or clinic. Record the average of the two measurements. To check your blood  pressure when you are not at a hospital or clinic, you can use:  An automated blood pressure machine at a pharmacy.  A home blood pressure monitor.  If you are 69-15 years old, ask your health care provider if you should take aspirin to prevent heart disease.  Diabetes screening involves taking a blood sample to check your fasting blood sugar level. This should be done once every 3 years after age 63 if you are at a normal weight and without risk factors for diabetes. Testing should be considered at a younger age or be carried out more frequently if you are overweight and have at least 1 risk factor for diabetes.  Colorectal cancer can be detected and often prevented. Most routine colorectal cancer screening begins at the age of 86 and continues through age 65. However, your health care provider may recommend screening at an earlier age if you have risk factors for colon cancer. On a yearly basis, your health care provider may provide home test kits to check for hidden blood in the stool. A small camera at the end of a tube may be used to directly examine the colon (sigmoidoscopy or colonoscopy) to detect the earliest forms of colorectal cancer. Talk to your health care provider about this at age 73 when routine screening begins. A direct exam of the colon should be repeated every 5-10 years through age 35, unless early forms of precancerous polyps or small growths are found.  People who are at an increased risk for hepatitis B should be screened for this virus. You are considered at high risk for hepatitis B if:  You were born in a country where hepatitis B occurs often. Talk with your health care provider about which countries are considered high risk.  Your parents were born in a high-risk country and you have not received a shot to protect against hepatitis B (hepatitis B vaccine).  You have HIV or AIDS.  You use needles to inject street drugs.  You live with, or have sex with, someone who  has hepatitis B.  You are a man who has sex with other men (MSM).  You get hemodialysis treatment.  You take certain medicines for conditions like cancer, organ transplantation, and autoimmune conditions.  Hepatitis C blood testing is recommended for all people born from [birth year] through 1965 and any individual with known risk factors for hepatitis C.  Healthy men should no longer receive prostate-specific antigen (PSA) blood tests as part of routine cancer screening. Talk to your health care provider about prostate cancer screening.  Testicular cancer screening is not recommended for adolescents or adult males who have no symptoms. Screening includes self-exam, a health care provider exam, and other screening tests. Consult with your health care provider about any symptoms you have or any concerns you have about testicular cancer.  Practice safe sex. Use condoms and avoid  high-risk sexual practices to reduce the spread of sexually transmitted infections (STIs).  You should be screened for STIs, including gonorrhea and chlamydia if:  You are sexually active and are younger than 24 years.  You are older than 24 years, and your health care provider tells you that you are at risk for this type of infection.  Your sexual activity has changed since you were last screened, and you are at an increased risk for chlamydia or gonorrhea. Ask your health care provider if you are at risk.  If you are at risk of being infected with HIV, it is recommended that you take a prescription medicine daily to prevent HIV infection. This is called pre-exposure prophylaxis (PrEP). You are considered at risk if:  You are a man who has sex with other men (MSM).  You are a heterosexual man who is sexually active with multiple partners.  You take drugs by injection.  You are sexually active with a partner who has HIV.  Talk with your health care provider about whether you are at high risk of being infected with  HIV. If you choose to begin PrEP, you should first be tested for HIV. You should then be tested every 3 months for as long as you are taking PrEP.  Use sunscreen. Apply sunscreen liberally and repeatedly throughout the day. You should seek shade when your shadow is shorter than you. Protect yourself by wearing long sleeves, pants, a wide-brimmed hat, and sunglasses year round whenever you are outdoors.  Tell your health care provider of new moles or changes in moles, especially if there is a change in shape or color. Also, tell your health care provider if a mole is larger than the size of a pencil eraser.  A one-time screening for abdominal aortic aneurysm (AAA) and surgical repair of large AAAs by ultrasound is recommended for men aged 28-75 years who are current or former smokers.  Stay current with your vaccines (immunizations).   This information is not intended to replace advice given to you by your health care provider. Make sure you discuss any questions you have with your health care provider.   Document Released: 08/02/2007 Document Revised: 02/24/2014 Document Reviewed: 07/01/2010 Elsevier Interactive Patient Education [employer].  \      Phone: [phone]

## 2017-02-20 DIAGNOSIS — C61 Malignant neoplasm of prostate: Secondary | ICD-10-CM | POA: Diagnosis not present

## 2017-02-27 DIAGNOSIS — R3915 Urgency of urination: Secondary | ICD-10-CM | POA: Diagnosis not present

## 2017-02-27 DIAGNOSIS — C61 Malignant neoplasm of prostate: Secondary | ICD-10-CM | POA: Diagnosis not present

## 2017-03-05 ENCOUNTER — Ambulatory Visit (INDEPENDENT_AMBULATORY_CARE_PROVIDER_SITE_OTHER): Payer: Medicare Other | Admitting: Internal Medicine

## 2017-03-05 ENCOUNTER — Encounter: Payer: Self-pay | Admitting: Internal Medicine

## 2017-03-05 VITALS — BP 166/80 | HR 83 | Temp 98.7°F | Ht 68.5 in | Wt 199.8 lb

## 2017-03-05 DIAGNOSIS — M549 Dorsalgia, unspecified: Secondary | ICD-10-CM | POA: Diagnosis not present

## 2017-03-05 MED ORDER — TIZANIDINE HCL 4 MG PO CAPS: 4.0000 mg | ORAL_CAPSULE | Freq: Three times a day (TID) | ORAL | 0 refills | Status: DC

## 2017-03-05 NOTE — Progress Notes (Signed)
Subjective:    Patient ID: James Edwards, male    DOB: [birth year], 67 y.o.   MRN: [id number]  HPI  67 year old patient who has a history of essential hypertension and is followed by urology with a history of prostate cancer. He was stable until yesterday when he noted some tightness in the left upper back region involving the inferomedial aspect of the scapular area.  The pain has intensified and at times becomes quite severe and is described as sharp.  He denies any unusual recent activities or precipitating factors. Earlier today he took 15 mg of meloxicam presently he is very comfortable and essentially pain-free  Past Medical History:  Diagnosis Date  . Arthritis   . Diabetes mellitus without complication ([place]) dx 84-69-62   type 2 diet controlled   . GERD (gastroesophageal reflux disease)   . Hypertension   . Prostate cancer [facility]) 2013   prostate     Social History   Socioeconomic History  . Marital status: Married    Spouse name: Not on file  . Number of children: Not on file  . Years of education: Not on file  . Highest education level: Not on file  Social Needs  . Financial resource strain: Not on file  . Food insecurity - worry: Not on file  . Food insecurity - inability: Not on file  . Transportation needs - medical: Not on file  . Transportation needs - non-medical: Not on file  Occupational History  . Not on file  Tobacco Use  . Smoking status: Former Research scientist (life sciences)  . Smokeless tobacco: Never Used  . Tobacco comment: per pt stopped in 2006   Substance and Sexual Activity  . Alcohol use: Yes    Alcohol/week: 0.0 oz    Comment: 10 beers per day   . Drug use: No  . Sexual activity: Not on file  Other Topics Concern  . 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    Two Children who both live locally.       He likes to sleep.     Past Surgical History:  Procedure Laterality Date    . colonscopy     x 2  . ELBOW SURGERY Right 2007  . PROSTATE BIOPSY     x 3  . PROSTATE BIOPSY N/A 01/21/2016   Procedure: BIOPSY TRANSRECTAL ULTRASONIC PROSTATE (TUBP);  Surgeon: Raynelle Bring, MD;  Location: [facility] ORS;  Service: Urology;  Laterality: N/A;    Family History  Problem Relation Age of Onset  . Arthritis Father   . Heart disease Father   . Hypertension Father   . Bladder Cancer Father   . Heart disease Mother   . Hypertension Mother   . Bladder Cancer Mother   . Glaucoma Mother     Allergies  Allergen Reactions  . Codeine Nausea And Vomiting    Current Outpatient Medications on File Prior to Visit  Medication Sig Dispense Refill  . meloxicam (MOBIC) 7.5 MG tablet TAKE ONE TABLET BY MOUTH ONCE DAILY 90 tablet 1  . Multiple Vitamin (MULTIVITAMIN) tablet Take 1 tablet by mouth daily.    Marland Kitchen oxybutynin (DITROPAN-XL) 10 MG 24 hr tablet     . quinapril (ACCUPRIL) 40 MG tablet Take 1 tablet (40 mg total) daily by mouth. 90 tablet 3  . traZODone (DESYREL) 50 MG tablet Take 1 tablet (50 mg total)  at bedtime as needed by mouth for sleep. 90 tablet 1  . TURMERIC PO Take by mouth.     No current facility-administered medications on file prior to visit.     BP (!) 166/80 (BP Location: Left Arm, Patient Position: Sitting, Cuff Size: Normal)   Pulse 83   Temp 98.7 F (37.1 C) (Oral)   Ht 5' 8.5" (1.74 m)   Wt 199 lb 12.8 oz (90.6 kg)   SpO2 98%   BMI 29.94 kg/m     Review of Systems  Constitutional: Negative for appetite change, chills, fatigue and fever.  HENT: Negative for congestion, dental problem, ear pain, hearing loss, sore throat, tinnitus, trouble swallowing and voice change.   Eyes: Negative for pain, discharge and visual disturbance.  Respiratory: Negative for cough, chest tightness, wheezing and stridor.   Cardiovascular: Negative for chest pain, palpitations and leg swelling.  Gastrointestinal: Negative for abdominal distention, abdominal pain, blood in  stool, constipation, diarrhea, nausea and vomiting.  Genitourinary: Negative for difficulty urinating, discharge, flank pain, genital sores, hematuria and urgency.  Musculoskeletal: Positive for back pain. Negative for arthralgias, gait problem, joint swelling, myalgias and neck stiffness.  Skin: Negative for rash.  Neurological: Negative for dizziness, syncope, speech difficulty, weakness, numbness and headaches.  Hematological: Negative for adenopathy. Does not bruise/bleed easily.  Psychiatric/Behavioral: Negative for behavioral problems and dysphoric mood. The patient is not nervous/anxious.        Objective:   Physical Exam  Constitutional: He appears well-developed and well-nourished. No distress.  Repeat blood pressure 144/76   Musculoskeletal:  Decreased range of motion of both shoulders No focal tenderness involving the left medial or inferior aspects of the scapular area.  No erythema or soft tissue swelling No maneuvers such as raising his arms overhead or touch in his toes seems to aggravate or precipitate the pain          Assessment & Plan:   Paroxysmal left upper back pain.  Sounds musculoligamentous.  Will continue short-term meloxicam 15 mg daily will place on short-term muscle relaxers Heat therapy gentle massage and range of motion discussed Will call if unimproved  Nyoka Cowden

## 2017-03-05 NOTE — Patient Instructions (Signed)
Meloxicam 7.5mg   2 tablets daily until improvement  You  may move around, but avoid painful motions and activities.  Apply heat  to the sore area for 15 to 20 minutes 3 or 4 times daily for the next two to 3 days.  Gentle stretching and range of motion exercises

## 2017-03-22 ENCOUNTER — Encounter: Payer: Self-pay | Admitting: Internal Medicine

## 2017-04-03 ENCOUNTER — Other Ambulatory Visit: Payer: Self-pay | Admitting: Family Medicine

## 2017-04-03 DIAGNOSIS — M1990 Unspecified osteoarthritis, unspecified site: Secondary | ICD-10-CM

## 2017-04-03 MED ORDER — MELOXICAM 7.5 MG PO TABS: 7.5000 mg | ORAL_TABLET | Freq: Every day | ORAL | 1 refills | Status: DC

## 2017-04-03 NOTE — Telephone Encounter (Signed)
Sent to the pharmacy by e-scribe. 

## 2017-04-03 NOTE — Addendum Note (Signed)
Addended by: Miles Costain T on: 04/03/2017 09:19 AM   Modules accepted: Orders

## 2017-05-22 ENCOUNTER — Encounter: Payer: Self-pay | Admitting: Gastroenterology

## 2017-05-28 DIAGNOSIS — H2513 Age-related nuclear cataract, bilateral: Secondary | ICD-10-CM | POA: Diagnosis not present

## 2017-06-22 ENCOUNTER — Other Ambulatory Visit: Payer: Self-pay | Admitting: Adult Health

## 2017-06-22 DIAGNOSIS — G4700 Insomnia, unspecified: Secondary | ICD-10-CM

## 2017-06-23 NOTE — Telephone Encounter (Signed)
Sent to the pharmacy by e-scribe. 

## 2017-07-20 ENCOUNTER — Other Ambulatory Visit: Payer: Self-pay

## 2017-07-20 ENCOUNTER — Ambulatory Visit (AMBULATORY_SURGERY_CENTER): Payer: Self-pay | Admitting: *Deleted

## 2017-07-20 VITALS — Ht 70.0 in | Wt 192.8 lb

## 2017-07-20 DIAGNOSIS — Z86010 Personal history of colonic polyps: Secondary | ICD-10-CM

## 2017-07-20 MED ORDER — PEG 3350-KCL-NA BICARB-NACL 420 G PO SOLR: 4000.0000 mL | Freq: Once | ORAL | 0 refills | Status: AC

## 2017-07-20 NOTE — Progress Notes (Signed)
No egg or soy allergy known to patient  No issues with past sedation with any surgeries  or procedures, no intubation problems  No diet pills per patient No home 02 use per patient  No blood thinners per patient  Pt denies issues with constipation  No A fib or A flutter  EMMI video  Declined by the patient.

## 2017-07-24 ENCOUNTER — Telehealth: Payer: Self-pay

## 2017-07-27 ENCOUNTER — Encounter: Payer: Medicare Other | Admitting: Gastroenterology

## 2017-08-19 ENCOUNTER — Ambulatory Visit: Payer: Medicare Other

## 2017-08-19 NOTE — Progress Notes (Deleted)
Subjective:   James Edwards is a 67 y.o. male who presents for Medicare Annual/Subsequent preventive examination.  Reports health as Labs 12/2016 chol/hdl ratio 3   Diet  Exercise  Drinks 10 beers per day Former smoker stopped in 2006 - 11 yo   Health Maintenance Due  Topic Date Due  . Hepatitis C Screening  [birth year]  . TETANUS/TDAP  09/19/1968  . COLONOSCOPY  02/17/2014  . OPHTHALMOLOGY EXAM  06/17/2017  . HEMOGLOBIN A1C  07/02/2017   Colonoscopy 02/2004 and ordered placed last year to complete    Qualifies for Shingles Vaccine?  Objective:    Vitals: There were no vitals taken for this visit.  There is no height or weight on file to calculate BMI.  Advanced Directives 08/18/2016 01/21/2016 01/17/2016  Does Patient Have a Medical Advance Directive? No No No  Would patient like information on creating a medical advance directive? No - Patient declined No - Patient declined No - Patient declined    Tobacco Social History   Tobacco Use  Smoking Status Former Smoker  Smokeless Tobacco Never Used  Tobacco Comment   per pt stopped in 2006      Counseling given: Not Answered Comment: per pt stopped in 2006    Clinical Intake:   Past Medical History:  Diagnosis Date  . Allergy   . Arthritis   . Diabetes mellitus without complication ([place]) dx 16-10-96   type 2 diet controlled   . GERD (gastroesophageal reflux disease)   . Hypertension   . Prostate cancer [facility]) 2013   prostate   Past Surgical History:  Procedure Laterality Date  . COLONOSCOPY    . colonscopy     x 2  . ELBOW SURGERY Right 2007  . POLYPECTOMY    . PROSTATE BIOPSY     x 3  . PROSTATE BIOPSY N/A 01/21/2016   Procedure: BIOPSY TRANSRECTAL ULTRASONIC PROSTATE (TUBP);  Surgeon: Raynelle Bring, MD;  Location: [facility];  Service: Urology;  Laterality: N/A;   Family History  Problem Relation Age of Onset  . Arthritis Father   . Heart disease Father   . Hypertension Father   . Bladder  Cancer Father   . Colon polyps Father   . Heart disease Mother   . Hypertension Mother   . Bladder Cancer Mother   . Glaucoma Mother   . Colon cancer Neg Hx   . Esophageal cancer Neg Hx   . Rectal cancer Neg Hx   . Stomach cancer Neg Hx    Social History   Socioeconomic History  . Marital status: Married    Spouse name: Not on file  . Number of children: Not on file  . Years of education: Not on file  . Highest education level: Not on file  Occupational History  . Not on file  Social Needs  . Financial resource strain: Not on file  . Food insecurity:    Worry: Not on file    Inability: Not on file  . Transportation needs:    Medical: Not on file    Non-medical: Not on file  Tobacco Use  . Smoking status: Former Research scientist (life sciences)  . Smokeless tobacco: Never Used  . Tobacco comment: per pt stopped in 2006   Substance and Sexual Activity  . Alcohol use: Yes    Alcohol/week: 0.0 oz    Comment: 10 beers per day   . Drug use: No  . Sexual activity: Not on file  Lifestyle  .  Physical activity:    Days per week: Not on file    Minutes per session: Not on file  . Stress: Not on file  Relationships  . Social connections:    Talks on phone: Not on file    Gets together: Not on file    Attends religious service: Not on file    Active member of club or organization: Not on file    Attends meetings of clubs or organizations: Not on file    Relationship status: Not on file  Other Topics Concern  . 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    Two Children who both live locally.       He likes to sleep.     Outpatient Encounter Medications as of 08/19/2017  Medication Sig  . Multiple Vitamin (MULTIVITAMIN) tablet Take 1 tablet by mouth daily.  . NON FORMULARY   . OVER THE COUNTER MEDICATION   . oxybutynin (DITROPAN-XL) 10 MG 24 hr tablet   . quinapril (ACCUPRIL) 40 MG tablet Take 1 tablet (40 mg total)  daily by mouth.  Marland Kitchen tiZANidine (ZANAFLEX) 4 MG capsule Take 1 capsule (4 mg total) by mouth 3 (three) times daily. (Patient not taking: Reported on 07/20/2017)  . traZODone (DESYREL) 50 MG tablet TAKE 1 TABLET BY MOUTH AT BEDTIME AS NEEDED FOR SLEEP  . TURMERIC PO Take by mouth.  Marland Kitchen UNABLE TO FIND Med Name: raisins soaked in Gin, 9 raisins every morning.   No facility-administered encounter medications on file as of 08/19/2017.     Activities of Daily Living No flowsheet data found.  Patient Care Team: Dorothyann Peng, NP as PCP - General (Family Medicine) [place], [facility]   Assessment:   This is a routine wellness examination for James Edwards.  Exercise Activities and Dietary recommendations    Goals    None      Fall Risk Fall Risk  01/02/2017 08/18/2016 01/03/2016 10/18/2014 10/18/2014  Falls in the past year? No Yes No No No  Number falls in past yr: - 1 - - -  Injury with Fall? - No - - -  Follow up - Falls prevention discussed - - -     Depression Screen PHQ 2/9 Scores 01/02/2017 08/18/2016 01/03/2016 10/18/2014  PHQ - 2 Score 0 0 0 0    Cognitive Function     Ad8 score reviewed for issues:  Issues making decisions:  Less interest in hobbies / activities:  Repeats questions, stories (family complaining):  Trouble using ordinary gadgets (microwave, computer, phone):  Forgets the month or year:   Mismanaging finances:   Remembering appts:  Daily problems with thinking and/or memory: Ad8 score is=        Immunization History  Administered Date(s) Administered  . Influenza, High Dose Seasonal PF 10/18/2014, 10/24/2015  . Pneumococcal Conjugate-13 10/18/2014  . Pneumococcal Polysaccharide-23 10/24/2015      Screening Tests Health Maintenance  Topic Date Due  . Hepatitis C Screening  1950/06/19  . TETANUS/TDAP  09/19/1968  . COLONOSCOPY  02/17/2014  . OPHTHALMOLOGY EXAM  06/17/2017  . HEMOGLOBIN A1C  07/02/2017  . INFLUENZA VACCINE   09/17/2017  . FOOT EXAM  01/02/2018  . PNA vac Low Risk Adult  Completed       Plan:      PCP Notes ***  Health Maintenance ***  Abnormal Screens  ***  Referrals  ***  Patient concerns; ***  Nurse Concerns; ***  Next PCP apt ***      I have personally reviewed and noted the following in the patient's chart:   . Medical and social history . Use of alcohol, tobacco or illicit drugs  . Current medications and supplements . Functional ability and status . Nutritional status . Physical activity . Advanced directives . List of other physicians . Hospitalizations, surgeries, and ER visits in previous 12 months . Vitals . Screenings to include cognitive, depression, and falls . Referrals and appointments  In addition, I have reviewed and discussed with patient certain preventive protocols, quality metrics, and best practice recommendations. A written personalized care plan for preventive services as well as general preventive health recommendations were provided to patient.     Wynetta Fines, RN  08/19/2017

## 2017-08-23 NOTE — Progress Notes (Addendum)
Subjective:   James Edwards is a 67 y.o. male who presents for Medicare Annual/Subsequent preventive examination.  Reports health as good DM without complication States he is not a diabetic now; lab A1c elevated x1 and now back as pre-diabetic He has already cut sugar. Discussed risk of insulin resistance on vessels.   Diet BMI 27.  Total chol/hdl 3;  A1c 5.9 Cut down on sugar; quit drinking soft drinks Breakfast; cup of coffee and toast Lunch - sandwich or chicken breast; light lunch Dinner - variety    BP 139 / 70 at home. Slightly elevated Still works. Brother just dx with dementia;  Living in duplex with family and just moved his brother    Exercise Walk 15 in the am and 15 in the afternoon 15 minutes downtown  ETOH 10 beers per day Recommended 2 beers a day  Went to low cal miller light  Discussed recommendation but declines to change at this time   Stopped smoking in 2006 x 67 yo  Explained the LDCT program - stated he does not feel he needs this but did take information on the program AAA - will discuss with Tommi Rumps at his next apt. Given information   Eye Exam-Last exam 06/2016, every 9 months. [place] in [place] Mother has glaucoma and he is followed closely   Dental-Last exam > 5 years. Only with issues.   Health Maintenance Due  Topic Date Due  . Hepatitis C Screening  30-Mar-1950  . TETANUS/TDAP  09/19/1968  . COLONOSCOPY  02/17/2014  . OPHTHALMOLOGY EXAM  06/17/2017  . HEMOGLOBIN A1C  07/02/2017   Agreed to hep c at his next blood draw   PSA 07/2016 3.5 - repeated q 3 months by urology and bx every 1 to 2 years  Dr. Anselm Lis   Colonoscopy scheduled for august 6th; [place] [facility] until later     Will have A1c at next office visit in November TBS     Objective:    Vitals: BP (!) 144/62   Pulse 70   Ht 5\' 10"  (1.778 m)   Wt 192 lb (87.1 kg)   SpO2 98%   BMI 27.55 kg/m   Body mass index is 27.55 kg/m.  Advanced  Directives 08/24/2017 08/18/2016 01/21/2016 01/17/2016  Does Patient Have a Medical Advance Directive? No No No No  Would patient like information on creating a medical advance directive? - No - Patient declined No - Patient declined No - Patient declined   Has not done this  Agreed to taking the [place] forms  Tobacco Social History   Tobacco Use  Smoking Status Former Smoker  . Packs/day: 2.00  . Years: 25.00  . Pack years: 50.00  Smokeless Tobacco Never Used  Tobacco Comment   per pt stopped in 2006      Counseling given: Yes Comment: per pt stopped in 2006    Clinical Intake:      Past Medical History:  Diagnosis Date  . Allergy   . Arthritis   . Diabetes mellitus without complication ([place]) dx [birth year]   type 2 diet controlled   . GERD (gastroesophageal reflux disease)   . Hypertension   . Prostate cancer [facility]) 2013   prostate   Past Surgical History:  Procedure Laterality Date  . COLONOSCOPY    . colonscopy     x 2  . ELBOW SURGERY Right 2007  . POLYPECTOMY    . PROSTATE BIOPSY  x 3  . PROSTATE BIOPSY N/A 01/21/2016   Procedure: BIOPSY TRANSRECTAL ULTRASONIC PROSTATE (TUBP);  Surgeon: Raynelle Bring, MD;  [facility];  Service: Urology;  Laterality: N/A;   Family History  Problem Relation Age of Onset  . Arthritis Father   . Heart disease Father   . Hypertension Father   . Bladder Cancer Father   . Colon polyps Father   . Heart disease Mother   . Hypertension Mother   . Bladder Cancer Mother   . Glaucoma Mother   . Colon cancer Neg Hx   . Esophageal cancer Neg Hx   . Rectal cancer Neg Hx   . Stomach cancer Neg Hx    Social History   Socioeconomic History  . Marital status: Married    Spouse name: Not on file  . Number of children: Not on file  . Years of education: Not on file  . Highest education level: Not on file  Occupational History  . Not on file  Social Needs  . Financial resource strain: Not on file  . Food insecurity:     Worry: Not on file    Inability: Not on file  . Transportation needs:    Medical: Not on file    Non-medical: Not on file  Tobacco Use  . Smoking status: Former Smoker    Packs/day: 2.00    Years: 25.00    Pack years: 50.00  . Smokeless tobacco: Never Used  . Tobacco comment: per pt stopped in 2006   Substance and Sexual Activity  . Alcohol use: Yes    Alcohol/week: 0.0 oz    Comment: 10 beers per day   . Drug use: No  . Sexual activity: Not on file  Lifestyle  . Physical activity:    Days per week: Not on file    Minutes per session: Not on file  . Stress: Not on file  Relationships  . Social connections:    Talks on phone: Not on file    Gets together: Not on file    Attends religious service: Not on file    Active member of club or organization: Not on file    Attends meetings of clubs or organizations: Not on file    Relationship status: Not on file  Other Topics Concern  . 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    Two Children who both live locally.       He likes to sleep.     Outpatient Encounter Medications as of 08/24/2017  Medication Sig  . Multiple Vitamin (MULTIVITAMIN) tablet Take 1 tablet by mouth daily.  . NON FORMULARY   . OVER THE COUNTER MEDICATION   . oxybutynin (DITROPAN-XL) 10 MG 24 hr tablet   . quinapril (ACCUPRIL) 40 MG tablet Take 1 tablet (40 mg total) daily by mouth.  Marland Kitchen tiZANidine (ZANAFLEX) 4 MG capsule Take 1 capsule (4 mg total) by mouth 3 (three) times daily.  . traZODone (DESYREL) 50 MG tablet TAKE 1 TABLET BY MOUTH AT BEDTIME AS NEEDED FOR SLEEP  . TURMERIC PO Take by mouth.  Marland Kitchen UNABLE TO FIND Med Name: raisins soaked in Gin, 9 raisins every morning.   No facility-administered encounter medications on file as of 08/24/2017.     Activities of Daily Living No flowsheet data found.  Patient Care Team: Dorothyann Peng, NP as PCP - General (Family  Medicine) [place], [facility]   Assessment:   This is a routine wellness examination for James Edwards.  Exercise Activities and Dietary recommendations    Goals    . Patient Stated     Keep on walking!        Fall Risk Fall Risk  08/24/2017 01/02/2017 08/18/2016 01/03/2016 10/18/2014  Falls in the past year? No No Yes No No  Number falls in past yr: - - 1 - -  Injury with Fall? - - No - -  Follow up - - Falls prevention discussed - -    Depression Screen PHQ 2/9 Scores 08/24/2017 01/02/2017 08/18/2016 01/03/2016  PHQ - 2 Score 0 0 0 0   Brother just dx with dementia Has issues with words Lives in a duplex with brother above and you in the middle and son one th bottom floor He is 34  Cognitive Function     Ad8 score reviewed for issues:  Issues making decisions:  Less interest in hobbies / activities:  Repeats questions, stories (family complaining):  Trouble using ordinary gadgets (microwave, computer, phone):  Forgets the month or year:   Mismanaging finances:   Remembering appts:  Daily problems with thinking and/or memory: Ad8 score is=0     Immunization History  Administered Date(s) Administered  . Influenza, High Dose Seasonal PF 10/18/2014, 10/24/2015  . Pneumococcal Conjugate-13 10/18/2014  . Pneumococcal Polysaccharide-23 10/24/2015       Plan:      PCP Notes   Health Maintenance ETOH 10 beers per day Recommended 2 beers a day  Went to low cal miller light  Discussed recommendation but declines to change at this time   Stopped smoking in 2006 x 67 yo  Explained the LDCT program - stated he does not feel he needs this but did take information on the program [employer] - will discuss with Tommi Rumps at his next apt. Given information   Eye Exam-Last exam 06/2016, every 9 months. [place] in [place] Mother has glaucoma and he is followed closely   Agreed to hep c at his next blood draw   PSA 07/2016 3.5 - repeated q 3  months by urology and bx every 1 to 2 years  Dr. Anselm Lis   Colonoscopy scheduled for august 6th; [place] [facility] until later     Will have A1c at next office visit in November TBS   Needs tDAP and given information;   Hep c with Cory at next OV; around Nov. Wife had Hep c and took medicine Was told Hep c can develop into liver cancer before it is dx and stated he was aware of this       Abnormal Screens  BP slightly elevated but just moved brother into apt above his as he is dx with dementia. To see Neurology  States his BP during the week  Is < 139/70   Referrals  None today  Educated regarding shingrix. He will check with other pharmacies  Patient concerns; Following UR and his brother. Still working in sells;   Nurse Concerns; ETOH intake discussed; does not feel he needs to quit. Drinking [employer], does not drink more that his usual 10   Next PCP apt TBS in November Will have A1c rechecked at that time Can discuss AAA and LDCT fup shingrix Colonoscopy and eye exam should be completed by this time  fup AD as he took [place] forms for himself and spouse      I  have personally reviewed and noted the following in the patient's chart:   . Medical and social history . Use of alcohol, tobacco or illicit drugs  . Current medications and supplements . Functional ability and status . Nutritional status . Physical activity . Advanced directives . List of other physicians . Hospitalizations, surgeries, and ER visits in previous 12 months . Vitals . Screenings to include cognitive, depression, and falls . Referrals and appointments  In addition, I have reviewed and discussed with patient certain preventive protocols, quality metrics, and best practice recommendations. A written personalized care plan for preventive services as well as general preventive health recommendations were provided to patient.     EPPIR,JJOAC, RN  08/24/2017  I have reviewed the  documentation for the [facility] and [place] provided by the health coach and agree with their documentation. I was immediately available for any questions  Eulas Post MD  Primary Care at [facility]

## 2017-08-24 ENCOUNTER — Ambulatory Visit (INDEPENDENT_AMBULATORY_CARE_PROVIDER_SITE_OTHER): Payer: Medicare Other

## 2017-08-24 VITALS — BP 144/62 | HR 70 | Ht 70.0 in | Wt 192.0 lb

## 2017-08-24 DIAGNOSIS — Z1159 Encounter for screening for other viral diseases: Secondary | ICD-10-CM

## 2017-08-24 DIAGNOSIS — Z0000 Encounter for general adult medical examination without abnormal findings: Secondary | ICD-10-CM

## 2017-08-24 NOTE — Patient Instructions (Addendum)
[name] , Thank you for taking time to come for your Medicare Wellness Visit. I appreciate your ongoing commitment to your health goals. Please review the following plan we discussed and let me know if I can assist you in the future.   Review the LDCT program  Men Ages 78 to 78 Years who Have Ever Smoked  The USPSTF recommends one-time screening for abdominal aortic aneurysm (AAA) with ultrasonography in men ages 46 to 45 years who have ever smoked.   A Tetanus is recommended every 10 years. Medicare covers a tetanus if you have a cut or wound; otherwise, there may be a charge. If you had not had a tetanus with pertusses, known as the Tdap, you can take this anytime.   Shingrix is a vaccine for the prevention of Shingles in Adults 50 and older.  If you are on Medicare, the shingrix is covered under your Part D plan, so you will take both of the vaccines in the series at your pharmacy. Please check with your benefits regarding applicable copays or out of pocket expenses.  The Shingrix is given in 2 vaccines approx 8 weeks apart. You must receive the 2nd dose prior to 6 months from receipt of the first. Please have the pharmacist print out you Immunization  dates for our office records   Alzheimer's Association / Family information and training  May check James Edwards online   https://www.clark-whitaker.org/.asp Loss adjuster, chartered for information  Driving resource center; Can send out driving contract  Get help & support Media planner  Support groups  Education programs  SAFETY; BlindResource.ca mers-dementia-home-safety.asp#howdementiaaffects   [facility] [address], [address] [place], [place] [address] P: [phone] F: [phone]  Families to call the 800 number to get information on all the resources in the area 24 hour 1 [phone]  Can provide resources; Questions about Dementia; Support  groups; Give the caregiver information regarding respite ([facility]) Call the [place] on Aging;as they oversee who gets the grant fund for certain areas (762-831-5176) Also have Tools to help navigate the needs of the patient  Opportunities for free educational programs online 100 support groups [place];  Just starting Early stage program and care partners for the patient and the family 12 weeks of a support group;  12 weeks of social engagement component End with Educational wrap up [place] the 800 number given for more information    These are the goals we discussed: Goals    None      This is a list of the screening recommended for you and due dates:  Health Maintenance  Topic Date Due  .  Hepatitis C: One time screening is recommended by Center for Disease Control  (CDC) for  adults born from [birth year] through 1966.   12/16/1950  . Tetanus Vaccine  09/19/1968  . Colon Cancer Screening  02/17/2014  . Eye exam for diabetics  06/17/2017  . Hemoglobin A1C  07/02/2017  . Flu Shot  09/17/2017  . Complete foot exam   01/02/2018  . Pneumonia vaccines  Completed   Prevention of falls: Remove rugs or any tripping hazards in the home Use Non slip mats in bathtubs and showers Placing grab bars next to the toilet and or shower Placing handrails on both sides of the stair way Adding extra lighting in the home.   Personal safety issues reviewed:  1. Consider starting a community watch program per [facility] 2.  Changes batteries  is smoke detector and/or carbon monoxide detector  3.  If you have firearms; keep them in a safe place 4.  Wear protection when in the sun; Always wear sunscreen or a hat; It is good to have your doctor check your skin annually or review any new areas of concern 5. Driving safety; Keep in the right lane; stay 3 car lengths behind the car in front of you on the highway; look 3 times prior to pulling out; carry your cell phone everywhere  you go!        Fall Prevention in the Home Falls can cause injuries and can affect people from all age groups. There are many simple things that you can do to make your home safe and to help prevent falls. What can I do on the outside of my home?  Regularly repair the edges of walkways and driveways and fix any cracks.  Remove high doorway thresholds.  Trim any shrubbery on the main path into your home.  Use bright outdoor lighting.  Clear walkways of debris and clutter, including tools and rocks.  Regularly check that handrails are securely fastened and in good repair. Both sides of any steps should have handrails.  Install guardrails along the edges of any raised decks or porches.  Have leaves, snow, and ice cleared regularly.  Use sand or salt on walkways during winter months.  In the garage, clean up any spills right away, including grease or oil spills. What can I do in the bathroom?  Use night lights.  Install grab bars by the toilet and in the tub and shower. Do not use towel bars as grab bars.  Use non-skid mats or decals on the floor of the tub or shower.  If you need to sit down while you are in the shower, use a plastic, non-slip stool.  Keep the floor dry. Immediately clean up any water that spills on the floor.  Remove soap buildup in the tub or shower on a regular basis.  Attach bath mats securely with double-sided non-slip rug tape.  Remove throw rugs and other tripping hazards from the floor. What can I do in the bedroom?  Use night lights.  Make sure that a bedside light is easy to reach.  Do not use oversized bedding that drapes onto the floor.  Have a firm chair that has side arms to use for getting dressed.  Remove throw rugs and other tripping hazards from the floor. What can I do in the kitchen?  Clean up any spills right away.  Avoid walking on wet floors.  Place frequently used items in easy-to-reach places.  If you need to  reach for something above you, use a sturdy step stool that has a grab bar.  Keep electrical cables out of the way.  Do not use floor polish or wax that makes floors slippery. If you have to use wax, make sure that it is non-skid floor wax.  Remove throw rugs and other tripping hazards from the floor. What can I do in the stairways?  Do not leave any items on the stairs.  Make sure that there are handrails on both sides of the stairs. Fix handrails that are broken or loose. Make sure that handrails are as long as the stairways.  Check any carpeting to make sure that it is firmly attached to the stairs. Fix any carpet that is loose or worn.  Avoid having throw rugs at the top or bottom of stairways, or secure  the rugs with carpet tape to prevent them from moving.  Make sure that you have a light switch at the top of the stairs and the bottom of the stairs. If you do not have them, have them installed. What are some other fall prevention tips?  Wear closed-toe shoes that fit well and support your feet. Wear shoes that have rubber soles or low heels.  When you use a stepladder, make sure that it is completely opened and that the sides are firmly locked. Have someone hold the ladder while you are using it. Do not climb a closed stepladder.  Add color or contrast paint or tape to grab bars and handrails in your home. Place contrasting color strips on the first and last steps.  Use mobility aids as needed, such as canes, walkers, scooters, and crutches.  Turn on lights if it is dark. Replace any light bulbs that burn out.  Set up furniture so that there are clear paths. Keep the furniture in the same spot.  Fix any uneven floor surfaces.  Choose a carpet design that does not hide the edge of steps of a stairway.  Be aware of any and all pets.  Review your medicines with your healthcare provider. Some medicines can cause dizziness or changes in blood pressure, which increase your risk  of falling. Talk with your health care provider about other ways that you can decrease your risk of falls. This may include working with a physical therapist or trainer to improve your strength, balance, and endurance. This information is not intended to replace advice given to you by your health care provider. Make sure you discuss any questions you have with your health care provider. Document Released: 01/24/2002 Document Revised: 07/03/2015 Document Reviewed: 03/10/2014 Elsevier Interactive Patient Education  2018 [place] Maintenance, Male A healthy lifestyle and preventive care is important for your health and wellness. Ask your health care provider about what schedule of regular examinations is right for you. What should I know about weight and diet? Eat a Healthy Diet  Eat plenty of vegetables, fruits, whole grains, low-fat dairy products, and lean protein.  Do not eat a lot of foods high in solid fats, added sugars, or salt.  Maintain a Healthy Weight Regular exercise can help you achieve or maintain a healthy weight. You should:  Do at least 150 minutes of exercise each week. The exercise should increase your heart rate and make you sweat (moderate-intensity exercise).  Do strength-training exercises at least twice a week.  Watch Your Levels of Cholesterol and Blood Lipids  Have your blood tested for lipids and cholesterol every 5 years starting at 68 years of age. If you are at high risk for heart disease, you should start having your blood tested when you are 67 years old. You may need to have your cholesterol levels checked more often if: ? Your lipid or cholesterol levels are high. ? You are older than 68 years of age. ? You are at high risk for heart disease.  What should I know about cancer screening? Many types of cancers can be detected early and may often be prevented. Lung Cancer  You should be screened every year for lung cancer if: ? You are a  current smoker who has smoked for at least 30 years. ? You are a former smoker who has quit within the past 15 years.  Talk to your health care provider about your screening options, when you should start screening,  and how often you should be screened.  Colorectal Cancer  Routine colorectal cancer screening usually begins at 68 years of age and should be repeated every 5-10 years until you are 68 years old. You may need to be screened more often if early forms of precancerous polyps or small growths are found. Your health care provider may recommend screening at an earlier age if you have risk factors for colon cancer.  Your health care provider may recommend using home test kits to check for hidden blood in the stool.  A small camera at the end of a tube can be used to examine your colon (sigmoidoscopy or colonoscopy). This checks for the earliest forms of colorectal cancer.  Prostate and Testicular Cancer  Depending on your age and overall health, your health care provider may do certain tests to screen for prostate and testicular cancer.  Talk to your health care provider about any symptoms or concerns you have about testicular or prostate cancer.  Skin Cancer  Check your skin from head to toe regularly.  Tell your health care provider about any new moles or changes in moles, especially if: ? There is a change in a mole's size, shape, or color. ? You have a mole that is larger than a pencil eraser.  Always use sunscreen. Apply sunscreen liberally and repeat throughout the day.  Protect yourself by wearing long sleeves, pants, a wide-brimmed hat, and sunglasses when outside.  What should I know about heart disease, diabetes, and high blood pressure?  If you are 60-60 years of age, have your blood pressure checked every 3-5 years. If you are 53 years of age or older, have your blood pressure checked every year. You should have your blood pressure measured twice-once when you are at  a [facility] or clinic, and once when you are not at a [facility] or clinic. Record the average of the two measurements. To check your blood pressure when you are not at a [facility] or clinic, you can use: ? An automated blood pressure machine at a pharmacy. ? A home blood pressure monitor.  Talk to your health care provider about your target blood pressure.  If you are between 11-79 years old, ask your health care provider if you should take aspirin to prevent heart disease.  Have regular diabetes screenings by checking your fasting blood sugar level. ? If you are at a normal weight and have a low risk for diabetes, have this test once every three years after the age of 15. ? If you are overweight and have a high risk for diabetes, consider being tested at a younger age or more often.  A one-time screening for abdominal aortic aneurysm (AAA) by ultrasound is recommended for men aged 44-75 years who are current or former smokers. What should I know about preventing infection? Hepatitis B If you have a higher risk for hepatitis B, you should be screened for this virus. Talk with your health care provider to find out if you are at risk for hepatitis B infection. Hepatitis C Blood testing is recommended for:  Everyone born from [birth year] through 1965.  Anyone with known risk factors for hepatitis C.  Sexually Transmitted Diseases (STDs)  You should be screened each year for STDs including gonorrhea and chlamydia if: ? You are sexually active and are younger than 68 years of age. ? You are older than 68 years of age and your health care provider tells you that you are at risk for this type  of infection. ? Your sexual activity has changed since you were last screened and you are at an increased risk for chlamydia or gonorrhea. Ask your health care provider if you are at risk.  Talk with your health care provider about whether you are at high risk of being infected with HIV. Your health care provider  may recommend a prescription medicine to help prevent HIV infection.  What else can I do?  Schedule regular health, dental, and eye exams.  Stay current with your vaccines (immunizations).  Do not use any tobacco products, such as cigarettes, chewing tobacco, and e-cigarettes. If you need help quitting, ask your health care provider.  Limit alcohol intake to no more than 2 drinks per day. One drink equals 12 ounces of beer, 5 ounces of wine, or 1 ounces of hard liquor.  Do not use street drugs.  Do not share needles.  Ask your health care provider for help if you need support or information about quitting drugs.  Tell your health care provider if you often feel depressed.  Tell your health care provider if you have ever been abused or do not feel safe at home. This information is not intended to replace advice given to you by your health care provider. Make sure you discuss any questions you have with your health care provider. Document Released: 08/02/2007 Document Revised: 10/03/2015 Document Reviewed: 11/07/2014 Elsevier Interactive Patient Education  [employer].

## 2017-08-28 DIAGNOSIS — C61 Malignant neoplasm of prostate: Secondary | ICD-10-CM | POA: Diagnosis not present

## 2017-09-04 DIAGNOSIS — R3915 Urgency of urination: Secondary | ICD-10-CM | POA: Diagnosis not present

## 2017-09-04 DIAGNOSIS — C61 Malignant neoplasm of prostate: Secondary | ICD-10-CM | POA: Diagnosis not present

## 2017-10-07 ENCOUNTER — Ambulatory Visit (AMBULATORY_SURGERY_CENTER): Payer: Medicare Other | Admitting: Internal Medicine

## 2017-10-07 ENCOUNTER — Encounter: Payer: Self-pay | Admitting: Internal Medicine

## 2017-10-07 VITALS — BP 117/63 | HR 54 | Temp 99.1°F | Resp 14 | Ht 70.0 in | Wt 192.0 lb

## 2017-10-07 DIAGNOSIS — K639 Disease of intestine, unspecified: Secondary | ICD-10-CM | POA: Diagnosis not present

## 2017-10-07 DIAGNOSIS — Z86010 Personal history of colonic polyps: Secondary | ICD-10-CM | POA: Diagnosis not present

## 2017-10-07 DIAGNOSIS — D122 Benign neoplasm of ascending colon: Secondary | ICD-10-CM

## 2017-10-07 DIAGNOSIS — K388 Other specified diseases of appendix: Secondary | ICD-10-CM | POA: Diagnosis not present

## 2017-10-07 DIAGNOSIS — Z1211 Encounter for screening for malignant neoplasm of colon: Secondary | ICD-10-CM | POA: Diagnosis not present

## 2017-10-07 MED ORDER — SODIUM CHLORIDE 0.9 % IV SOLN: 500.0000 mL | Freq: Once | INTRAVENOUS | Status: DC

## 2017-10-07 NOTE — Op Note (Signed)
[place] Patient Name: James Edwards Procedure Date: 10/07/2017 10:11 AM MRN: [id number] Endoscopist: Docia Chuck. Henrene Pastor , MD Age: 67 Referring MD:  Date of Birth: 06/16/1950 Gender: Male Account #: [id number] Procedure:                Colonoscopy, with biopsies; with cold snare                            polypectomy x 2 Indications:              High risk colon cancer surveillance: Personal                            history of non-advanced adenoma. Previous                            examinations with Dr. Oletta Lamas 2003 right to see                            small adenoma) and 2007 (negative for neoplasia) Medicines:                Monitored Anesthesia Care Procedure:                Pre-Anesthesia Assessment:                           - Prior to the procedure, a History and Physical                            was performed, and patient medications and                            allergies were reviewed. The patient's tolerance of                            previous anesthesia was also reviewed. The risks                            and benefits of the procedure and the sedation                            options and risks were discussed with the patient.                            All questions were answered, and informed consent                            was obtained. Prior Anticoagulants: The patient has                            taken no previous anticoagulant or antiplatelet                            agents. ASA Grade Assessment: II - A patient with  mild systemic disease. After reviewing the risks                            and benefits, the patient was deemed in                            satisfactory condition to undergo the procedure.                           After obtaining informed consent, the colonoscope                            was passed under direct vision. Throughout the                            procedure, the patient's blood  pressure, pulse, and                            oxygen saturations were monitored continuously. The                            Colonoscope was introduced through the anus and                            advanced to the the cecum, identified by                            appendiceal orifice and ileocecal valve. The                            ileocecal valve, appendiceal orifice, and rectum                            were photographed. The quality of the bowel                            preparation was adequate to identify polyps. The                            colonoscopy was performed without difficulty. The                            patient tolerated the procedure well. The bowel                            preparation used was GoLYTELY. Scope In: 10:22:20 AM Scope Out: 10:38:05 AM Scope Withdrawal Time: 0 hours 11 minutes 7 seconds  Total Procedure Duration: 0 hours 15 minutes 45 seconds  Findings:                 Two polyps were found in the ascending colon. The                            polyps were 2 to 3 mm in size. These polyps were  removed with a cold snare. Resection and retrieval                            were complete.                           Multiple small and large-mouthed diverticula were                            found in the entire colon.                           Internal hemorrhoids were found during retroflexion.                           The entire examined colon appeared otherwise normal                            on direct and retroflexion views. The area of the                            appendiceal orifice was inverted and prominent.                            Biopsies taken with cold forceps for histology. Complications:            No immediate complications. Estimated blood loss:                            None. Estimated Blood Loss:     Estimated blood loss: none. Impression:               - Two 2 to 3 mm polyps in the ascending  colon,                            removed with a cold snare. Resected and retrieved.                           - Diverticulosis in the entire examined colon.                           - Internal hemorrhoids.                           - Prominent appendiceal orifice biopsied.The entire                            examined colon is otherwise normal on direct and                            retroflexion views. Recommendation:           - Repeat colonoscopy in 5 years for surveillance                            (STANDARD SPLIT PREP TO BE USED).                           -  Patient has a contact number available for                            emergencies. The signs and symptoms of potential                            delayed complications were discussed with the                            patient. Return to normal activities tomorrow.                            Written discharge instructions were provided to the                            patient.                           - Resume previous diet.                           - Continue present medications.                           - Await pathology results. Docia Chuck. Henrene Pastor, MD 10/07/2017 10:49:07 AM This report has been signed electronically.

## 2017-10-07 NOTE — Progress Notes (Signed)
Pt's states no medical or surgical changes since previsit or office visit. 

## 2017-10-07 NOTE — Progress Notes (Signed)
Called to room to assist during endoscopic procedure.  Patient ID and intended procedure confirmed with present staff. Received instructions for my participation in the procedure from the performing physician.  

## 2017-10-07 NOTE — Progress Notes (Signed)
A and O x3. Report to RN. Tolerated MAC anesthesia well.

## 2017-10-07 NOTE — Patient Instructions (Signed)
HANDOUTS GIVEN FOR POLYPS, DIVERTICULOSIS AND HEMORRHOIDS  YOU HAD AN ENDOSCOPIC PROCEDURE TODAY AT THE [facility]:   Refer to the procedure report that was given to you for any specific questions about what was found during the examination.  If the procedure report does not answer your questions, please call your gastroenterologist to clarify.  If you requested that your care partner not be given the details of your procedure findings, then the procedure report has been included in a sealed envelope for you to review at your convenience later.  YOU SHOULD EXPECT: Some feelings of bloating in the abdomen. Passage of more gas than usual.  Walking can help get rid of the air that was put into your GI tract during the procedure and reduce the bloating. If you had a lower endoscopy (such as a colonoscopy or flexible sigmoidoscopy) you may notice spotting of blood in your stool or on the toilet paper. If you underwent a bowel prep for your procedure, you may not have a normal bowel movement for a few days.  Please Note:  You might notice some irritation and congestion in your nose or some drainage.  This is from the oxygen used during your procedure.  There is no need for concern and it should clear up in a day or so.  SYMPTOMS TO REPORT IMMEDIATELY:   Following lower endoscopy (colonoscopy or flexible sigmoidoscopy):  Excessive amounts of blood in the stool  Significant tenderness or worsening of abdominal pains  Swelling of the abdomen that is new, acute  Fever of 100F or higher  For urgent or emergent issues, a gastroenterologist can be reached at any hour by calling [phone].   DIET:  We do recommend a small meal at first, but then you may proceed to your regular diet.  Drink plenty of fluids but you should avoid alcoholic beverages for 24 hours.  ACTIVITY:  You should plan to take it easy for the rest of today and you should NOT DRIVE or use heavy machinery until tomorrow  (because of the sedation medicines used during the test).    FOLLOW UP: Our staff will call the number listed on your records the next business day following your procedure to check on you and address any questions or concerns that you may have regarding the information given to you following your procedure. If we do not reach you, we will leave a message.  However, if you are feeling well and you are not experiencing any problems, there is no need to return our call.  We will assume that you have returned to your regular daily activities without incident.  If any biopsies were taken you will be contacted by phone or by letter within the next 1-3 weeks.  Please call us at [phone] if you have not heard about the biopsies in 3 weeks.    SIGNATURES/CONFIDENTIALITY: You and/or your care partner have signed paperwork which will be entered into your electronic medical record.  These signatures attest to the fact that that the information above on your After Visit Summary has been reviewed and is understood.  Full responsibility of the confidentiality of this discharge information lies with you and/or your care-partner. 

## 2017-10-08 ENCOUNTER — Telehealth: Payer: Self-pay

## 2017-10-08 NOTE — Telephone Encounter (Signed)
  Follow up Call-  Call back number 10/07/2017  Post procedure Call Back phone  # [phone]  Permission to leave phone message Yes  Some recent data might be hidden     Patient questions:  Do you have a fever, pain , or abdominal swelling? No. Pain Score  0 *  Have you tolerated food without any problems? Yes.    Have you been able to return to your normal activities? Yes.    Do you have any questions about your discharge instructions: Diet   No. Medications  No. Follow up visit  No.  Do you have questions or concerns about your Care? No.  Actions: * If pain score is 4 or above: No action needed, pain <4.

## 2017-10-13 ENCOUNTER — Encounter: Payer: Self-pay | Admitting: Internal Medicine

## 2017-10-25 ENCOUNTER — Other Ambulatory Visit: Payer: Self-pay | Admitting: Adult Health

## 2017-10-25 DIAGNOSIS — G4700 Insomnia, unspecified: Secondary | ICD-10-CM

## 2017-10-27 NOTE — Telephone Encounter (Signed)
Filled for 6 months 06/2017 to [facility].  This is a request from a different pharmacy.  Message sent to have the pt call if needed.

## 2017-10-28 ENCOUNTER — Telehealth: Payer: Self-pay | Admitting: Adult Health

## 2017-10-28 DIAGNOSIS — G4700 Insomnia, unspecified: Secondary | ICD-10-CM

## 2017-10-28 NOTE — Telephone Encounter (Signed)
Copied from [place] [id number]. Topic: Quick Communication - Rx Refill/Question >> Oct 28, 2017  9:48 AM Bea Graff, NT wrote: Medication: traZODone (DESYREL) 50 MG tablet   Has the patient contacted their pharmacy? Yes.   (Agent: If no, request that the patient contact the pharmacy for the refill.) (Agent: If yes, when and what did the pharmacy advise?)  Preferred Pharmacy (with phone number or street name): [id number] [place], [place] [phone] (Phone) [phone] (Fax)    Agent: Please be advised that RX refills may take up to 3 business days. We ask that you follow-up with your pharmacy.

## 2017-10-28 NOTE — Telephone Encounter (Signed)
TC to patient re his request to refill Trazodone. No answer Left VM he could call back. If he calls back,  he has refills at [facility] on [address] that were phoned in 06/23/17.

## 2017-10-28 NOTE — Telephone Encounter (Signed)
Pt called and stated that he would like RX sent to  [id number] [place], [place] [phone] (Phone) [phone] (Fax)   Please advise

## 2017-10-28 NOTE — Telephone Encounter (Signed)
trazodone refill Last Refill:06/23/17 # 90 Last OV: 01/02/17 PCP: Dorothyann Peng [facility] [place] [place]

## 2017-10-29 MED ORDER — TRAZODONE HCL 50 MG PO TABS: 50.0000 mg | ORAL_TABLET | Freq: Every evening | ORAL | 0 refills | Status: DC | PRN

## 2017-10-29 NOTE — Addendum Note (Signed)
Addended by: Miles Costain T on: 10/29/2017 12:10 PM   Modules accepted: Orders

## 2017-10-29 NOTE — Telephone Encounter (Addendum)
I spoke to [facility] where prescription was originally sent.  They do not have any refills on file.  Last filled by them on 03/18/17 and a refill was transferred out on 06/23/17.  Will send in new rx to new pharmacy.

## 2017-12-04 DIAGNOSIS — C61 Malignant neoplasm of prostate: Secondary | ICD-10-CM | POA: Diagnosis not present

## 2017-12-05 DIAGNOSIS — Z23 Encounter for immunization: Secondary | ICD-10-CM | POA: Diagnosis not present

## 2018-02-04 ENCOUNTER — Other Ambulatory Visit: Payer: Self-pay | Admitting: Adult Health

## 2018-02-04 ENCOUNTER — Encounter: Payer: Self-pay | Admitting: Family Medicine

## 2018-02-04 DIAGNOSIS — I10 Essential (primary) hypertension: Secondary | ICD-10-CM

## 2018-02-04 NOTE — Telephone Encounter (Signed)
Sent to the pharmacy for 30 days.  Pt is past due for cpx.  Letter mailed.

## 2018-02-17 DIAGNOSIS — Z8616 Personal history of COVID-19: Secondary | ICD-10-CM

## 2018-02-17 HISTORY — DX: Personal history of COVID-19: Z86.16

## 2018-02-18 ENCOUNTER — Other Ambulatory Visit: Payer: Self-pay | Admitting: Adult Health

## 2018-02-18 ENCOUNTER — Encounter: Payer: Self-pay | Admitting: Family Medicine

## 2018-02-18 DIAGNOSIS — G4700 Insomnia, unspecified: Secondary | ICD-10-CM

## 2018-02-18 NOTE — Telephone Encounter (Signed)
Sent to the pharmacy by e-scribe for 30 days.  Contact letter sent to the pt.  Past due for cpx and lab work.

## 2018-03-02 ENCOUNTER — Other Ambulatory Visit: Payer: Self-pay | Admitting: Adult Health

## 2018-03-02 DIAGNOSIS — I10 Essential (primary) hypertension: Secondary | ICD-10-CM

## 2018-03-03 NOTE — Telephone Encounter (Signed)
Sent to the pharmacy by e-scribe.  Pt has upcoming cpx on 03/05/2018

## 2018-03-05 ENCOUNTER — Ambulatory Visit (INDEPENDENT_AMBULATORY_CARE_PROVIDER_SITE_OTHER): Payer: Medicare Other | Admitting: Adult Health

## 2018-03-05 ENCOUNTER — Encounter: Payer: Self-pay | Admitting: Adult Health

## 2018-03-05 VITALS — BP 132/70 | Temp 98.3°F | Ht 69.0 in | Wt 185.0 lb

## 2018-03-05 DIAGNOSIS — Z87891 Personal history of nicotine dependence: Secondary | ICD-10-CM | POA: Diagnosis not present

## 2018-03-05 DIAGNOSIS — Z136 Encounter for screening for cardiovascular disorders: Secondary | ICD-10-CM | POA: Diagnosis not present

## 2018-03-05 DIAGNOSIS — E782 Mixed hyperlipidemia: Secondary | ICD-10-CM | POA: Diagnosis not present

## 2018-03-05 DIAGNOSIS — G4709 Other insomnia: Secondary | ICD-10-CM | POA: Diagnosis not present

## 2018-03-05 DIAGNOSIS — Z1159 Encounter for screening for other viral diseases: Secondary | ICD-10-CM | POA: Diagnosis not present

## 2018-03-05 DIAGNOSIS — E119 Type 2 diabetes mellitus without complications: Secondary | ICD-10-CM | POA: Diagnosis not present

## 2018-03-05 DIAGNOSIS — Z114 Encounter for screening for human immunodeficiency virus [HIV]: Secondary | ICD-10-CM

## 2018-03-05 DIAGNOSIS — Z122 Encounter for screening for malignant neoplasm of respiratory organs: Secondary | ICD-10-CM | POA: Diagnosis not present

## 2018-03-05 DIAGNOSIS — I10 Essential (primary) hypertension: Secondary | ICD-10-CM | POA: Diagnosis not present

## 2018-03-05 LAB — CBC WITH DIFFERENTIAL/PLATELET
BASOS PCT: 0.7 % (ref 0.0–3.0)
Basophils Absolute: 0 10*3/uL (ref 0.0–0.1)
Eosinophils Absolute: 0.2 10*3/uL (ref 0.0–0.7)
Eosinophils Relative: 3.5 % (ref 0.0–5.0)
HCT: 40.8 % (ref 39.0–52.0)
Hemoglobin: 14.3 g/dL (ref 13.0–17.0)
LYMPHS ABS: 1.4 10*3/uL (ref 0.7–4.0)
Lymphocytes Relative: 21 % (ref 12.0–46.0)
MCHC: 35 g/dL (ref 30.0–36.0)
MCV: 96.3 fl (ref 78.0–100.0)
MONOS PCT: 12 % (ref 3.0–12.0)
Monocytes Absolute: 0.8 10*3/uL (ref 0.1–1.0)
NEUTROS ABS: 4.3 10*3/uL (ref 1.4–7.7)
NEUTROS PCT: 62.8 % (ref 43.0–77.0)
Platelets: 286 10*3/uL (ref 150.0–400.0)
RBC: 4.24 Mil/uL (ref 4.22–5.81)
RDW: 13.1 % (ref 11.5–15.5)
WBC: 6.9 10*3/uL (ref 4.0–10.5)

## 2018-03-05 LAB — TSH: TSH: 0.74 u[IU]/mL (ref 0.35–4.50)

## 2018-03-05 LAB — COMPREHENSIVE METABOLIC PANEL
ALT: 13 U/L (ref 0–53)
AST: 17 U/L (ref 0–37)
Albumin: 4.3 g/dL (ref 3.5–5.2)
Alkaline Phosphatase: 26 U/L — ABNORMAL LOW (ref 39–117)
BILIRUBIN TOTAL: 0.7 mg/dL (ref 0.2–1.2)
BUN: 12 mg/dL (ref 6–23)
CALCIUM: 10.6 mg/dL — AB (ref 8.4–10.5)
CHLORIDE: 99 meq/L (ref 96–112)
CO2: 26 meq/L (ref 19–32)
Creatinine, Ser: 0.79 mg/dL (ref 0.40–1.50)
GFR: 97.41 mL/min (ref >60.00)
Glucose, Bld: 99 mg/dL (ref 70–99)
Potassium: 4.8 mEq/L (ref 3.5–5.1)
Sodium: 133 mEq/L — ABNORMAL LOW (ref 135–145)
Total Protein: 6.9 g/dL (ref 6.0–8.3)

## 2018-03-05 LAB — LIPID PANEL
Cholesterol: 204 mg/dL — ABNORMAL HIGH (ref 0–200)
HDL: 83.3 mg/dL (ref >39.00)
LDL Cholesterol: 106 mg/dL — ABNORMAL HIGH (ref 0–99)
NONHDL: 120.27
Total CHOL/HDL Ratio: 2
Triglycerides: 70 mg/dL (ref 0.0–149.0)
VLDL: 14 mg/dL (ref 0.0–40.0)

## 2018-03-05 LAB — HEMOGLOBIN A1C: Hgb A1c MFr Bld: 5.5 % (ref 4.6–6.5)

## 2018-03-05 NOTE — Progress Notes (Signed)
Subjective:    Patient ID: James Edwards, male    DOB: [birth year], 68 y.o.   MRN: [id number]  HPI Patient presents for yearly preventative medicine examination. He is a pleasant 68 year old male who  has a past medical history of Allergy, Arthritis, Diabetes mellitus without complication ([place]) (dx 70-01-74), GERD (gastroesophageal reflux disease), Hypertension, and Prostate cancer ([place]) (2013).  Patient reports that his young 54 year old daughter died about two weeks ago from unintentional drug overdose. He reports that he is doing about as well as he can at this time. He has been drinking more beer than normal.   Essential Hypertension - Takes Accupril 40 mg daily  BP Readings from Last 3 Encounters:  03/05/18 132/70  10/07/17 117/63  08/24/17 (!) 144/62   H/o Prostate Cancer - is followed by Dr. Alinda Money at [facility]. Is currently on Ditropan  10 mg for BPH symptoms. Currently getting up about 2 times a night to urinate   DM - diet controlled in the past Lab Results  Component Value Date   HGBA1C 5.9 01/02/2017   H/O Tobacco Use - quit in 2006 and smoked for about 13 years. He is due to AAA screen and lung cancer screen  Hyperlipidemia - diet controlled currently.   Insomnia - takes Trazodone 50 mg QHS - feels controlled.   All immunizations and health maintenance protocols were reviewed with the patient and needed orders were placed. utd   Appropriate screening laboratory values were ordered for the patient including screening of hyperlipidemia, renal function and hepatic function. If indicated by BPH, a PSA was ordered.  Medication reconciliation,  past medical history, social history, problem list and allergies were reviewed in detail with the patient  Goals were established with regard to weight loss, exercise, and  diet in compliance with medications  End of life planning was discussed.  He is up to date on routine screening colonoscopy. He is having his eye  exam next week    Review of Systems  Constitutional: Negative.   HENT: Negative.   Eyes: Negative.   Respiratory: Negative.   Cardiovascular: Negative.   Gastrointestinal: Negative.   Endocrine: Negative.   Genitourinary: Negative.   Musculoskeletal: Positive for arthralgias.  Skin: Negative.   Allergic/Immunologic: Negative.   Neurological: Negative.   Hematological: Negative.   Psychiatric/Behavioral: Positive for dysphoric mood.  All other systems reviewed and are negative.  Past Medical History:  Diagnosis Date  . Allergy   . Arthritis   . Diabetes mellitus without complication ([place]) dx 94-49-67   type 2 diet controlled   . GERD (gastroesophageal reflux disease)   . Hypertension   . Prostate cancer [facility]) 2013   prostate    Social History   Socioeconomic History  . Marital status: Married    Spouse name: Not on file  . Number of children: Not on file  . Years of education: Not on file  . Highest education level: Not on file  Occupational History  . Not on file  Social Needs  . Financial resource strain: Not on file  . Food insecurity:    Worry: Not on file    Inability: Not on file  . Transportation needs:    Medical: Not on file    Non-medical: Not on file  Tobacco Use  . Smoking status: Former Smoker    Packs/day: 2.00    Years: 25.00    Pack years: 50.00  . Smokeless tobacco: Never Used  .  Tobacco comment: per pt stopped in 2006   Substance and Sexual Activity  . Alcohol use: Yes    Alcohol/week: 0.0 standard drinks    Comment: 10 beers per day   . Drug use: No  . Sexual activity: Not on file  Lifestyle  . Physical activity:    Days per week: Not on file    Minutes per session: Not on file  . Stress: Not on file  Relationships  . Social connections:    Talks on phone: Not on file    Gets together: Not on file    Attends religious service: Not on file    Active member of club or organization: Not on file    Attends meetings of clubs or  organizations: Not on file    Relationship status: Not on file  . Intimate partner violence:    Fear of current or ex partner: Not on file    Emotionally abused: Not on file    Physically abused: Not on file    Forced sexual activity: Not on file  Other Topics Concern  . 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    Two Children who both live locally.       He likes to sleep.     Past Surgical History:  Procedure Laterality Date  . COLONOSCOPY    . colonscopy     x 2  . ELBOW SURGERY Right 2007  . POLYPECTOMY    . PROSTATE BIOPSY     x 3  . PROSTATE BIOPSY N/A 01/21/2016   Procedure: BIOPSY TRANSRECTAL ULTRASONIC PROSTATE (TUBP);  Surgeon: Raynelle Bring, MD;  Location: [facility];  Service: Urology;  Laterality: N/A;    Family History  Problem Relation Age of Onset  . Arthritis Father   . Heart disease Father   . Hypertension Father   . Bladder Cancer Father   . Colon polyps Father   . Heart disease Mother   . Hypertension Mother   . Bladder Cancer Mother   . Glaucoma Mother   . Colon cancer Neg Hx   . Esophageal cancer Neg Hx   . Rectal cancer Neg Hx   . Stomach cancer Neg Hx     Allergies  Allergen Reactions  . Codeine Nausea And Vomiting    Current Outpatient Medications on File Prior to Visit  Medication Sig Dispense Refill  . Multiple Vitamin (MULTIVITAMIN) tablet Take 1 tablet by mouth daily.    . NON FORMULARY     . OVER THE COUNTER MEDICATION     . oxybutynin (DITROPAN-XL) 10 MG 24 hr tablet     . quinapril (ACCUPRIL) 40 MG tablet TAKE 1 TABLET BY MOUTH EVERY DAY 30 tablet 0  . traZODone (DESYREL) 50 MG tablet Take 1 tablet (50 mg total) by mouth at bedtime as needed. **YEARLY APPOINTMENT DUE WITH Chelby Salata** 30 tablet 0  . TURMERIC PO Take by mouth.    Marland Kitchen UNABLE TO FIND Med Name: raisins soaked in Gin, 9 raisins every morning.     No current facility-administered medications on file  prior to visit.     BP 132/70   Temp 98.3 F (36.8 C)   Ht 5\' 9"  (1.753 m)   Wt 185 lb (83.9 kg)   BMI 27.32 kg/m       Objective:   Physical Exam Vitals signs and nursing note reviewed.  Constitutional:      General: He is not in acute distress.    Appearance: He is well-developed and normal weight. He is not diaphoretic.  HENT:     Head: Normocephalic and atraumatic.     Right Ear: Tympanic membrane, ear canal and external ear normal. There is no impacted cerumen.     Left Ear: Tympanic membrane, ear canal and external ear normal. There is no impacted cerumen.     Nose: Nose normal. No congestion or rhinorrhea.     Mouth/Throat:     Mouth: Mucous membranes are moist.     Pharynx: Oropharynx is clear. No oropharyngeal exudate.  Eyes:     General: No scleral icterus.       Right eye: No discharge.        Left eye: No discharge.     Conjunctiva/sclera: Conjunctivae normal.     Pupils: Pupils are equal, round, and reactive to light.  Neck:     Musculoskeletal: Normal range of motion and neck supple.     Thyroid: No thyromegaly.     Trachea: No tracheal deviation.  Cardiovascular:     Rate and Rhythm: Normal rate and regular rhythm.     Heart sounds: Normal heart sounds. No murmur. No friction rub. No gallop.   Pulmonary:     Effort: Pulmonary effort is normal. No respiratory distress.     Breath sounds: Normal breath sounds. No stridor. No wheezing, rhonchi or rales.  Chest:     Chest wall: No tenderness.  Abdominal:     General: Bowel sounds are normal. There is no distension.     Palpations: Abdomen is soft. There is no mass.     Tenderness: There is no abdominal tenderness. There is no right CVA tenderness, left CVA tenderness, guarding or rebound.     Hernia: No hernia is present.  Musculoskeletal: Normal range of motion.        General: No swelling, tenderness, deformity or signs of injury.     Right lower leg: No edema.     Left lower leg: No edema.    Lymphadenopathy:     Cervical: No cervical adenopathy.  Skin:    General: Skin is warm and dry.     Capillary Refill: Capillary refill takes less than 2 seconds.     Coloration: Skin is not jaundiced or pale.     Findings: No bruising, erythema, lesion or rash.  Neurological:     General: No focal deficit present.     Mental Status: He is alert and oriented to person, place, and time. Mental status is at baseline.     Cranial Nerves: No cranial nerve deficit.     Coordination: Coordination normal.  Psychiatric:        Mood and Affect: Mood normal.        Behavior: Behavior normal.        Thought Content: Thought content normal.        Judgment: Judgment normal.       Assessment & Plan:  1. Type 2 diabetes mellitus without complication, without long-term current use of insulin ([place]) - Consider adding metformin  - CBC with Differential/Platelet - Hemoglobin A1c - Comprehensive metabolic panel - Lipid panel - TSH  2. Essential hypertension - Well controlled. No change in medications  - CBC with Differential/Platelet - Hemoglobin A1c - Comprehensive metabolic panel - Lipid panel - TSH  3. Mixed hyperlipidemia - Consider statin  - Encouraged to cut back on  alcohol consumption - CBC with Differential/Platelet - Hemoglobin A1c - Comprehensive metabolic panel - Lipid panel - TSH  4. Need for hepatitis C screening test  - Hep C Antibody   5. Other insomnia - Continue with Trazadone. This will help with reactive depressions as well   6. Encounter for abdominal aortic aneurysm (AAA) screening  - VAS US AORTA MEDICARE SCREEN; Future  7. Encounter for screening for malignant neoplasm of respiratory organs  - CT CHEST LUNG CANCER SCREENING LOW DOSE WO CONTRAST; Future  8. History of tobacco use  - CT CHEST LUNG CANCER SCREENING LOW DOSE WO CONTRAST; Future  Dorothyann Peng, NP

## 2018-03-06 LAB — HEPATITIS C ANTIBODY
HEP C AB: NONREACTIVE
SIGNAL TO CUT-OFF: 0.08 (ref <1.00)

## 2018-03-09 ENCOUNTER — Other Ambulatory Visit: Payer: Self-pay | Admitting: Adult Health

## 2018-03-09 DIAGNOSIS — Z122 Encounter for screening for malignant neoplasm of respiratory organs: Secondary | ICD-10-CM

## 2018-03-10 ENCOUNTER — Telehealth: Payer: Self-pay | Admitting: Acute Care

## 2018-03-11 DIAGNOSIS — H2513 Age-related nuclear cataract, bilateral: Secondary | ICD-10-CM | POA: Diagnosis not present

## 2018-03-11 NOTE — Telephone Encounter (Signed)
Message routed to Denise to follow up. 

## 2018-03-11 NOTE — Telephone Encounter (Signed)
Spoke with pt regarding lung cancer screening.  PT advised that he will not qualify due to quit smoking 15 years ago and active dx of prostate cancer.  Letter sent to Dorothyann Peng, NP to make him aware.

## 2018-03-12 ENCOUNTER — Other Ambulatory Visit: Payer: Self-pay | Admitting: Adult Health

## 2018-03-12 DIAGNOSIS — G4700 Insomnia, unspecified: Secondary | ICD-10-CM

## 2018-03-12 DIAGNOSIS — C61 Malignant neoplasm of prostate: Secondary | ICD-10-CM | POA: Diagnosis not present

## 2018-03-12 NOTE — Telephone Encounter (Signed)
Last refill given on 02/18/18 for #30 with no ref

## 2018-03-19 ENCOUNTER — Encounter (HOSPITAL_COMMUNITY): Payer: Medicare Other

## 2018-03-19 DIAGNOSIS — N5201 Erectile dysfunction due to arterial insufficiency: Secondary | ICD-10-CM | POA: Diagnosis not present

## 2018-03-19 DIAGNOSIS — N401 Enlarged prostate with lower urinary tract symptoms: Secondary | ICD-10-CM | POA: Diagnosis not present

## 2018-03-19 DIAGNOSIS — R3915 Urgency of urination: Secondary | ICD-10-CM | POA: Diagnosis not present

## 2018-03-19 DIAGNOSIS — C61 Malignant neoplasm of prostate: Secondary | ICD-10-CM | POA: Diagnosis not present

## 2018-03-26 ENCOUNTER — Ambulatory Visit (HOSPITAL_COMMUNITY)
Admission: RE | Admit: 2018-03-26 | Discharge: 2018-03-26 | Disposition: A | Discharge status: Home / Self Care | Payer: Medicare Other | Source: Ambulatory Visit | Attending: Adult Health | Admitting: Adult Health

## 2018-03-26 DIAGNOSIS — Z136 Encounter for screening for cardiovascular disorders: Secondary | ICD-10-CM | POA: Insufficient documentation

## 2018-03-26 DIAGNOSIS — Z87891 Personal history of nicotine dependence: Secondary | ICD-10-CM | POA: Diagnosis not present

## 2018-04-02 ENCOUNTER — Other Ambulatory Visit: Payer: Self-pay | Admitting: Adult Health

## 2018-04-02 DIAGNOSIS — I10 Essential (primary) hypertension: Secondary | ICD-10-CM

## 2018-04-06 NOTE — Telephone Encounter (Signed)
Sent to the pharmacy by e-scribe. 

## 2018-04-09 DIAGNOSIS — N401 Enlarged prostate with lower urinary tract symptoms: Secondary | ICD-10-CM | POA: Diagnosis not present

## 2018-04-09 DIAGNOSIS — R351 Nocturia: Secondary | ICD-10-CM | POA: Diagnosis not present

## 2018-04-09 DIAGNOSIS — C61 Malignant neoplasm of prostate: Secondary | ICD-10-CM | POA: Diagnosis not present

## 2018-04-22 ENCOUNTER — Other Ambulatory Visit: Payer: Self-pay | Admitting: Urology

## 2018-05-03 ENCOUNTER — Other Ambulatory Visit: Payer: Self-pay | Admitting: Urology

## 2018-05-03 ENCOUNTER — Other Ambulatory Visit (HOSPITAL_COMMUNITY): Payer: Self-pay | Admitting: Urology

## 2018-05-03 DIAGNOSIS — C61 Malignant neoplasm of prostate: Secondary | ICD-10-CM

## 2018-05-31 ENCOUNTER — Encounter (HOSPITAL_COMMUNITY): Admission: RE | Admit: 2018-05-31 | Payer: Medicare Other | Source: Ambulatory Visit

## 2018-05-31 ENCOUNTER — Other Ambulatory Visit (HOSPITAL_COMMUNITY): Payer: Self-pay | Admitting: Urology

## 2018-05-31 DIAGNOSIS — C61 Malignant neoplasm of prostate: Secondary | ICD-10-CM

## 2018-06-10 ENCOUNTER — Other Ambulatory Visit (HOSPITAL_COMMUNITY): Payer: Medicare Other

## 2018-06-24 ENCOUNTER — Other Ambulatory Visit: Payer: Self-pay | Admitting: Urology

## 2018-07-08 NOTE — Patient Instructions (Addendum)
YOU ARE REQUIRED TO BE TESTED FOR COVID-19 PRIOR TO YOUR SURGERY . YOUR TEST MUST BE COMPLETED ON Tuesday Jul 13, 2018. TESTING IS LOCATED AT [facility] ENTRANCE FROM 9:00AM - 3:00PM. FAILURE TO COMPLETE TESTING MAY RESULT IN CANCELLATION OF YOUR SURGERY.                  James Edwards    Your procedure is scheduled on: 07-15-2018   Report to [facility] Main  Entrance     Report to admitting at 6:45AM    Call this number if you have problems the morning of surgery [phone]      Remember: Do not eat food or drink liquids :After Midnight. BRUSH YOUR TEETH MORNING OF SURGERY AND RINSE YOUR MOUTH OUT, NO CHEWING GUM CANDY OR MINTS.     Take these medicines the morning of surgery with A SIP OF WATER: NONE   DO NOT TAKE ANY DIABETES MEDICATION THE DAY OF SURGERY! PLEASE CHECK YOUR BLOOD SUGAR THE DAY OF SURGERY. REPORT TO YOUR NURSE ON ARRIVAL.  IF YOUR BLOOD SUGAR IS LESS THAN 70 THE MORNING SURGERY WHEN YOU CHECK IT AT HOME, YOU NEED TO CALL THE [phone] FOR FURTHER INSTRUCTIONS!                                 You may not have any metal on your body including hair pins and              piercings  Do not wear jewelry, make-up, lotions, powders or perfumes, deodorant                          Men may shave face and neck.   Do not bring valuables to the hospital. [place].  Contacts, dentures or bridgework may not be worn into surgery.      Patients discharged the day of surgery will not be allowed to drive home. IF YOU ARE HAVING SURGERY AND GOING HOME THE SAME DAY, YOU MUST HAVE AN ADULT TO DRIVE YOU HOME AND BE WITH YOU FOR 24 HOURS. YOU MAY GO HOME BY TAXI OR UBER OR ORTHERWISE, BUT AN ADULT MUST ACCOMPANY YOU HOME AND STAY WITH YOU FOR 24 HOURS.  Name and phone number of your driver:  Special Instructions: N/A              Please read over the following fact sheets  you were given: _____________________________________________________________________             [facility] - Preparing for Surgery Before surgery, you can play an important role.  Because skin is not sterile, your skin needs to be as free of germs as possible.  You can reduce the number of germs on your skin by washing with CHG (chlorahexidine gluconate) soap before surgery.  CHG is an antiseptic cleaner which kills germs and bonds with the skin to continue killing germs even after washing. Please DO NOT use if you have an allergy to CHG or antibacterial soaps.  If your skin becomes reddened/irritated stop using the CHG and inform your nurse when you arrive at Short Stay. Do not shave (including legs and underarms) for at least 48 hours prior to the first CHG  shower.  You may shave your face/neck. Please follow these instructions carefully:  1.  Shower with CHG Soap the night before surgery and the  morning of Surgery.  2.  If you choose to wash your hair, wash your hair first as usual with your  normal  shampoo.  3.  After you shampoo, rinse your hair and body thoroughly to remove the  shampoo.                           4.  Use CHG as you would any other liquid soap.  You can apply chg directly  to the skin and wash                       Gently with a scrungie or clean washcloth.  5.  Apply the CHG Soap to your body ONLY FROM THE NECK DOWN.   Do not use on face/ open                           Wound or open sores. Avoid contact with eyes, ears mouth and genitals (private parts).                       Wash face,  Genitals (private parts) with your normal soap.             6.  Wash thoroughly, paying special attention to the area where your surgery  will be performed.  7.  Thoroughly rinse your body with warm water from the neck down.  8.  DO NOT shower/wash with your normal soap after using and rinsing off  the CHG Soap.                9.  Pat yourself dry with a clean towel.            10.   Wear clean pajamas.            11.  Place clean sheets on your bed the night of your first shower and do not  sleep with pets. Day of Surgery : Do not apply any lotions/deodorants the morning of surgery.  Please wear clean clothes to the hospital/surgery center.  FAILURE TO FOLLOW THESE INSTRUCTIONS MAY RESULT IN THE CANCELLATION OF YOUR SURGERY PATIENT SIGNATURE_________________________________  NURSE SIGNATURE__________________________________  ________________________________________________________________________

## 2018-07-08 NOTE — Progress Notes (Signed)
Called pt to complete covid screening q'. No answer

## 2018-07-09 ENCOUNTER — Encounter (HOSPITAL_COMMUNITY): Payer: Self-pay

## 2018-07-09 ENCOUNTER — Other Ambulatory Visit: Payer: Self-pay

## 2018-07-09 ENCOUNTER — Encounter (HOSPITAL_COMMUNITY)
Admission: RE | Admit: 2018-07-09 | Discharge: 2018-07-09 | Disposition: A | Discharge status: Home / Self Care | Payer: Medicare Other | Source: Ambulatory Visit | Attending: Urology | Admitting: Urology

## 2018-07-09 DIAGNOSIS — Z01818 Encounter for other preprocedural examination: Secondary | ICD-10-CM | POA: Diagnosis not present

## 2018-07-09 DIAGNOSIS — C61 Malignant neoplasm of prostate: Secondary | ICD-10-CM | POA: Diagnosis not present

## 2018-07-09 DIAGNOSIS — I10 Essential (primary) hypertension: Secondary | ICD-10-CM | POA: Diagnosis not present

## 2018-07-09 DIAGNOSIS — R001 Bradycardia, unspecified: Secondary | ICD-10-CM | POA: Insufficient documentation

## 2018-07-09 DIAGNOSIS — E119 Type 2 diabetes mellitus without complications: Secondary | ICD-10-CM | POA: Insufficient documentation

## 2018-07-09 LAB — HEMOGLOBIN A1C
Hgb A1c MFr Bld: 5.4 % (ref 4.8–5.6)
Mean Plasma Glucose: 108.28 mg/dL

## 2018-07-09 LAB — CBC
HCT: 42.6 % (ref 39.0–52.0)
Hemoglobin: 14.3 g/dL (ref 13.0–17.0)
MCH: 33 pg (ref 26.0–34.0)
MCHC: 33.6 g/dL (ref 30.0–36.0)
MCV: 98.4 fL (ref 80.0–100.0)
Platelets: 255 10*3/uL (ref 150–400)
RBC: 4.33 MIL/uL (ref 4.22–5.81)
RDW: 12.4 % (ref 11.5–15.5)
WBC: 7.6 10*3/uL (ref 4.0–10.5)
nRBC: 0 % (ref 0.0–0.2)

## 2018-07-09 LAB — BASIC METABOLIC PANEL
Anion gap: 8 (ref 5–15)
BUN: 12 mg/dL (ref 8–23)
CO2: 25 mmol/L (ref 22–32)
Calcium: 10.1 mg/dL (ref 8.9–10.3)
Chloride: 105 mmol/L (ref 98–111)
Creatinine, Ser: 0.89 mg/dL (ref 0.61–1.24)
GFR calc Af Amer: >60 mL/min (ref >60)
GFR calc non Af Amer: >60 mL/min (ref >60)
Glucose, Bld: 122 mg/dL — ABNORMAL HIGH (ref 70–99)
Potassium: 4.6 mmol/L (ref 3.5–5.1)
Sodium: 138 mmol/L (ref 135–145)

## 2018-07-13 ENCOUNTER — Other Ambulatory Visit (HOSPITAL_COMMUNITY)
Admission: RE | Admit: 2018-07-13 | Discharge: 2018-07-13 | Disposition: A | Discharge status: Home / Self Care | Payer: Medicare Other | Source: Ambulatory Visit | Attending: Urology | Admitting: Urology

## 2018-07-13 ENCOUNTER — Other Ambulatory Visit: Payer: Self-pay

## 2018-07-13 DIAGNOSIS — Z1159 Encounter for screening for other viral diseases: Secondary | ICD-10-CM | POA: Insufficient documentation

## 2018-07-13 LAB — SARS CORONAVIRUS 2 BY RT PCR (HOSPITAL ORDER, PERFORMED IN ~~LOC~~ HOSPITAL LAB): SARS Coronavirus 2: NEGATIVE

## 2018-07-14 NOTE — H&P (Signed)
CC/HPI: 1. Prostate cancer  2. BPH/LUTS  3. Erectile dysfunction   He returns today for further evaluation of his lower urinary tract symptoms. After increasing his dose of oxybutynin ER to 15 mg, he noted significant improvement of his nocturia from 3-4 times per night to 1 time per night. However, his daytime urinary frequency and urgency became more bothersome. It was noted that he had a very elevated PVR at 424 cc at his appointment a couple of weeks ago. He returns today after having decreased his dose of oxybutynin ER to 10 mg again. He has found that his urgency and frequency is significantly improved. His nocturia remains acceptable at 2 times per night. IPSS is 14. He follows up today with a PVR.     ALLERGIES: Codeine Derivatives    MEDICATIONS: Cialis 20 mg tablet 1 tablet PO PRN  Oxybutynin Chloride Er 10 mg tablet, extended release 24 hr 1 tablet PO Daily  Meloxicam  Multiple Vitamin  Quinapril Hcl 40 mg tablet Oral  Trazodone Hcl  Turmeric     GU PSH: Prostate Needle Biopsy - 01/21/2016      PSH Notes: Elbow Arthroscopy   NON-GU PSH: Elbow Arthroscopy/surgery - 2011    GU PMH: BPH w/LUTS - 07/29/2016 Nocturia - 07/29/2016 Urinary Urgency - 07/29/2016 ED due to arterial insufficiency, Erectile dysfunction due to arterial insufficiency - 2017 Prostate Cancer, Prostate cancer - 2017      PMH Notes:   ** Cannot tolerate biopsies in the office even despite oral sedation (requires IV sedation in OR)   1) Prostate cancer: He was noted to have an increase in his PSA from 1.3 in November 2008 up to 3.04 in May 2011. His PSA was rechecked when I first evaluated him in June 2011 and remained elevated at 3.53. 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 He has no family history of prostate cancer. He has been thoroughly counseled by myself and Dr. Tammi Klippel about his treatment options and  elected active surveillance.   Initial diagnosis: July 2011  TNM stage: cT1c Nx Mx  PSA at diagnosis: 3.53  Gleason score: 3+3=6  Biopsy ( 09/04/09): 2/12 cores -- L mid (10%), R lateral mid (5%)  Prostate volume: 37.7 cc  PSAD: 0.09   Surveillance:  Jan 2012: MRI -- L medial apical nodule most suspicious area, No EPE, SVI, or LAD  Jan 2012: 4/22 cores positive -- L lateral apex (<5%, 3+3=6), L mid (2/2 cores, 25% and 5%, 3+3=6, PNI) L lateral mid (<5%, 3+3=6), Vol 38.0 cc  Aug 2013: 1/12 cores positive -- L mid (< 5%), multifocal HGPIN, Vol 39.5 cc  Sep 2015: 2/12 cores -- L mid (10%, 3+3=6, PNI), R lateral base (< 5%, 3+3=6), Vol 33.1 cc  Dec 2017: 1/12 core positive -- L apex (20%, 3+3=6), Vol 31 cc   Baseline urinary function: IPSS: 15. He has very little bother from his symptoms which include a weak stream, urgency, and nocturia.  Baseline erectile function: SHIM score 16. He was prescribed Cialis 10 mg prn.   2) BPH/LUTS: His baseline symptoms include frequency, urgency, and nocturia.   Current treatment:  Prior treatment: Vesicare 10 mg (helpful but not covered), Myrbetriq 50 mg   NON-GU PMH: Diabetes Type 2 Hypercholesterolemia Hypertension    FAMILY HISTORY: nephrolithiasis - Father Prostate Cancer - No Family History   SOCIAL HISTORY: Marital Status: Married Preferred Language:  English; Ethnicity: Not Hispanic Or Latino; Race: White Current Smoking Status: Patient does not smoke anymore.   Tobacco Use Assessment Completed: Used Tobacco in last 30 days?     Notes: Former smoker, Marital History - Currently Married, Alcohol Use   REVIEW OF SYSTEMS:    GU Review Male:   Patient denies frequent urination, hard to postpone urination, burning/ pain with urination, get up at night to urinate, leakage of urine, stream starts and stops, trouble starting your streams, and have to strain to urinate .  Gastrointestinal (Lower):   Patient denies diarrhea and constipation.   Gastrointestinal (Upper):   Patient denies nausea and vomiting.  Constitutional:   Patient denies fever, night sweats, weight loss, and fatigue.  Skin:   Patient denies skin rash/ lesion and itching.  Eyes:   Patient denies blurred vision and double vision.  Ears/ Nose/ Throat:   Patient denies sore throat and sinus problems.  Hematologic/Lymphatic:   Patient denies swollen glands and easy bruising.  Cardiovascular:   Patient denies leg swelling and chest pains.  Respiratory:   Patient denies cough and shortness of breath.  Endocrine:   Patient denies excessive thirst.  Musculoskeletal:   Patient denies back pain and joint pain.  Neurological:   Patient denies headaches and dizziness.  Psychologic:   Patient denies depression and anxiety.   VITAL SIGNS:     Weight 185 lb / 83.91 kg  Height 69 in / 175.26 cm  BMI 27.3 kg/m   MULTI-SYSTEM PHYSICAL EXAMINATION:    Constitutional: Well-nourished. No physical deformities. Normally developed. Good grooming.  Respiratory: No labored breathing, no use of accessory muscles.   Cardiovascular: Normal temperature, normal extremity pulses, no swelling, no varicosities.     PAST DATA REVIEWED:  Source Of History:  Patient  Urine Test Review:   Urinalysis  Urodynamics Review:   Review Bladder Scan   03/12/18 12/04/17 08/28/17 02/20/17 07/17/16 12/13/15 05/05/15 10/25/14  PSA  Total PSA 3.25 ng/mL 3.88 ng/mL 6.88 ng/mL 5.09 ng/mL 3.52 ng/dl 4.51 ng/dl 5.28  6.32   Free PSA   1.05 ng/mL       % Free PSA   15 % PSA            ASSESSMENT:      ICD-10 Details  1 GU:   Prostate Cancer - C61   2   Nocturia - R35.1   3   BPH w/LUTS - N40.1    PLAN:        1. BPH/LUTS: His symptoms are significantly improved and his PVR is significantly decreased. He will therefore continue oxybutynin ER 10 mg daily. We will continue to monitor his prn symptoms. Currently, he feels that his symptoms are quite tolerable.   2. Prostate cancer: He will be  scheduled for a prostate biopsy in the operating room. He has been unable to tolerate biopsies in the office even with oral sedation. We have reviewed the potential risks and complications of this procedure.   3. Erectile dysfunction: He will continue a trial of Cialis 20 mg prn.   Cc: Dr. Leighton Ruff

## 2018-07-14 NOTE — Progress Notes (Signed)
SPOKE W/  _patient     SCREENING SYMPTOMS OF COVID 19:   COUGH--denies  RUNNY NOSE---denies   SORE THROAT---denies  NASAL CONGESTION----denies  SNEEZING----denies  SHORTNESS OF BREATH---denies  DIFFICULTY BREATHING---denies  TEMP >100.0 -----denies  UNEXPLAINED BODY ACHES------denies  CHILLS -------denies   HEADACHES ------denies  LOSS OF SMELL/ TASTE ------denies   HAVE YOU OR ANY FAMILY MEMBER TRAVELLED PAST 14 DAYS OUT OF THE -  no  COUNTY---no  STATE----no COUNTRY---no  HAVE YOU OR ANY FAMILY MEMBER BEEN EXPOSED TO ANYONE WITH COVID 19? denies

## 2018-07-15 ENCOUNTER — Other Ambulatory Visit (HOSPITAL_COMMUNITY): Payer: Medicare Other

## 2018-07-15 ENCOUNTER — Ambulatory Visit (HOSPITAL_COMMUNITY): Admission: RE | Admit: 2018-07-15 | Payer: Medicare Other | Source: Ambulatory Visit

## 2018-07-15 ENCOUNTER — Ambulatory Visit (HOSPITAL_COMMUNITY): Payer: Medicare Other | Admitting: Anesthesiology

## 2018-07-15 ENCOUNTER — Encounter (HOSPITAL_COMMUNITY): Payer: Self-pay | Admitting: Anesthesiology

## 2018-07-15 ENCOUNTER — Ambulatory Visit (HOSPITAL_COMMUNITY): Payer: Medicare Other | Admitting: Physician Assistant

## 2018-07-15 ENCOUNTER — Ambulatory Visit (HOSPITAL_COMMUNITY)
Admission: RE | Admit: 2018-07-15 | Discharge: 2018-07-15 | Disposition: A | Discharge status: Home / Self Care | Payer: Medicare Other | Source: Other Acute Inpatient Hospital | Attending: Urology | Admitting: Urology

## 2018-07-15 ENCOUNTER — Encounter (HOSPITAL_COMMUNITY)
Admission: RE | Disposition: A | Discharge status: Home / Self Care | Payer: Self-pay | Source: Other Acute Inpatient Hospital | Attending: Urology

## 2018-07-15 ENCOUNTER — Ambulatory Visit (HOSPITAL_COMMUNITY): Payer: Medicare Other

## 2018-07-15 DIAGNOSIS — Z885 Allergy status to narcotic agent status: Secondary | ICD-10-CM | POA: Diagnosis not present

## 2018-07-15 DIAGNOSIS — E785 Hyperlipidemia, unspecified: Secondary | ICD-10-CM | POA: Diagnosis not present

## 2018-07-15 DIAGNOSIS — N401 Enlarged prostate with lower urinary tract symptoms: Secondary | ICD-10-CM | POA: Diagnosis not present

## 2018-07-15 DIAGNOSIS — C61 Malignant neoplasm of prostate: Secondary | ICD-10-CM | POA: Diagnosis not present

## 2018-07-15 DIAGNOSIS — R351 Nocturia: Secondary | ICD-10-CM | POA: Diagnosis not present

## 2018-07-15 DIAGNOSIS — E119 Type 2 diabetes mellitus without complications: Secondary | ICD-10-CM | POA: Diagnosis not present

## 2018-07-15 DIAGNOSIS — Z791 Long term (current) use of non-steroidal anti-inflammatories (NSAID): Secondary | ICD-10-CM | POA: Insufficient documentation

## 2018-07-15 DIAGNOSIS — I10 Essential (primary) hypertension: Secondary | ICD-10-CM | POA: Insufficient documentation

## 2018-07-15 DIAGNOSIS — Z79899 Other long term (current) drug therapy: Secondary | ICD-10-CM | POA: Diagnosis not present

## 2018-07-15 DIAGNOSIS — M1990 Unspecified osteoarthritis, unspecified site: Secondary | ICD-10-CM | POA: Diagnosis not present

## 2018-07-15 DIAGNOSIS — Z87891 Personal history of nicotine dependence: Secondary | ICD-10-CM | POA: Diagnosis not present

## 2018-07-15 DIAGNOSIS — N529 Male erectile dysfunction, unspecified: Secondary | ICD-10-CM | POA: Diagnosis not present

## 2018-07-15 HISTORY — PX: PROSTATE BIOPSY: SHX241

## 2018-07-15 SURGERY — BIOPSY, PROSTATE, RECTAL APPROACH, WITH US GUIDANCE
Anesthesia: Monitor Anesthesia Care | Site: Rectum

## 2018-07-15 MED ORDER — PROPOFOL 10 MG/ML IV BOLUS
INTRAVENOUS | Status: AC
  Filled 2018-07-15: qty 40

## 2018-07-15 MED ORDER — EPHEDRINE 5 MG/ML INJ
INTRAVENOUS | Status: AC
  Filled 2018-07-15: qty 10

## 2018-07-15 MED ORDER — FENTANYL CITRATE (PF) 100 MCG/2ML IJ SOLN: 25.0000 ug | INTRAMUSCULAR | Status: DC | PRN

## 2018-07-15 MED ORDER — LIDOCAINE HCL 2 % IJ SOLN
INTRAMUSCULAR | Status: AC
  Filled 2018-07-15: qty 20

## 2018-07-15 MED ORDER — FLEET ENEMA 7-19 GM/118ML RE ENEM: 1.0000 | ENEMA | Freq: Once | RECTAL | Status: DC

## 2018-07-15 MED ORDER — PROPOFOL 500 MG/50ML IV EMUL
INTRAVENOUS | Status: DC | PRN
  Administered 2018-07-15: 100 ug/kg/min via INTRAVENOUS

## 2018-07-15 MED ORDER — ONDANSETRON HCL 4 MG/2ML IJ SOLN: 4.0000 mg | Freq: Once | INTRAMUSCULAR | Status: DC | PRN

## 2018-07-15 MED ORDER — EPHEDRINE SULFATE 50 MG/ML IJ SOLN
INTRAMUSCULAR | Status: DC | PRN
  Administered 2018-07-15: 10 mg via INTRAVENOUS
  Administered 2018-07-15: 5 mg via INTRAVENOUS

## 2018-07-15 MED ORDER — LACTATED RINGERS IV SOLN
INTRAVENOUS | Status: DC
  Administered 2018-07-15: 07:00:00 via INTRAVENOUS

## 2018-07-15 MED ORDER — FENTANYL CITRATE (PF) 100 MCG/2ML IJ SOLN
INTRAMUSCULAR | Status: AC
  Filled 2018-07-15: qty 2

## 2018-07-15 MED ORDER — SODIUM CHLORIDE 0.9 % IV SOLN
2.0000 g | Freq: Once | INTRAVENOUS | Status: AC
  Administered 2018-07-15: 2 g via INTRAVENOUS
  Filled 2018-07-15: qty 20

## 2018-07-15 MED ORDER — MIDAZOLAM HCL 5 MG/5ML IJ SOLN
INTRAMUSCULAR | Status: DC | PRN
  Administered 2018-07-15: 2 mg via INTRAVENOUS

## 2018-07-15 MED ORDER — MEPERIDINE HCL 50 MG/ML IJ SOLN: 6.2500 mg | INTRAMUSCULAR | Status: DC | PRN

## 2018-07-15 MED ORDER — MIDAZOLAM HCL 2 MG/2ML IJ SOLN
INTRAMUSCULAR | Status: AC
  Filled 2018-07-15: qty 2

## 2018-07-15 MED ORDER — FENTANYL CITRATE (PF) 100 MCG/2ML IJ SOLN
INTRAMUSCULAR | Status: DC | PRN
  Administered 2018-07-15 (×2): 50 ug via INTRAVENOUS

## 2018-07-15 SURGICAL SUPPLY — 7 items
COVER SURGICAL LIGHT HANDLE (MISCELLANEOUS) ×3 IMPLANT
INST BIOPSY MAXCORE 18GX25 (NEEDLE) IMPLANT
INSTR BIOPSY MAXCORE 18GX20 (NEEDLE) IMPLANT
KIT TURNOVER KIT A (KITS) IMPLANT
NEEDLE SPNL 22GX7 QUINCKE BK (NEEDLE) ×3 IMPLANT
SYR CONTROL 10ML LL (SYRINGE) IMPLANT
UNDERPAD 30X30 (UNDERPADS AND DIAPERS) ×3 IMPLANT

## 2018-07-15 NOTE — Op Note (Signed)
  Preoperative diagnosis:  1. Prostate cancer  Postoperative diagnosis: 1. Prostate cancer  Procedure(s): 1. Transrectal ultrasound-guided prostate needle biopsy 2. Transrectal ultrasound of the prostate  Surgeon: Dr. Roxy Horseman, Jr  Anesthesia: Sedation  Complications: None  EBL: Minimal  Specimens: 1. Right lateral base 2.  Right base 3.  Right lateral mid 4.  Right mid  5.  Right lateral apex 6.  Right apex 7.  Left lateral base 8.  Left base 9.  Left lateral mid 10.  Left mid 11. Left lateral apex 12. Left apex  Disposition of specimens: Pathology  Indication: Mr. Donterius Filley is a 68 year old gentleman with low risk prostate cancer.  He presents today for a surveillance protocol biopsy.  He was unable to tolerate a prior attempts at a biopsy in the office due to a tight anal sphincter and significant pain with probe insertion.  We have reviewed the potential risks and complications of a prostate needle biopsy in detail.  He gives informed consent to proceed.  Description of procedure:  The patient was taken to the operating room and IV sedation was administered.  He was placed in the left lateral decubitus position and administered preoperative ceftriaxone.  Preoperative timeout was performed.  The transrectal ultrasound probe was then placed into the rectum and the prostate was visualized.  The prostate measured 41 cc. There was no evidence of any abnormalities that raise concern for obvious malignancy on ultrasound.  The prostate was homogeneous.  A 12 core biopsy was obtained in a standard sextant fashion on the lateral and parasagittal regions of the base, mid, and apex areas of the right and left prostate gland.  All biopsies were taken under direct ultrasound guidance.  All of the biopsy specimens were placed in formalin and sent for permanent pathologic analysis.  Following the procedure, a rectal exam was performed and demonstrated no evidence of  significant bleeding.  The patient was able to be safely transferred to the recovery unit in satisfactory condition.  No complications were noted.

## 2018-07-15 NOTE — Discharge Instructions (Signed)
Please call if fever > 101 or excessive bleeding.

## 2018-07-15 NOTE — Anesthesia Preprocedure Evaluation (Signed)
Anesthesia Evaluation  Patient identified by MRN, date of birth, ID band Patient awake    Reviewed: Allergy & Precautions, H&P , NPO status , Patient's Chart, lab work & pertinent test results  Airway Mallampati: II   Neck ROM: full    Dental  (+) Dental Advisory Given, Teeth Intact   Pulmonary former smoker,    breath sounds clear to auscultation       Cardiovascular hypertension,  Rhythm:regular Rate:Normal     Neuro/Psych    GI/Hepatic GERD  ,  Endo/Other  diabetes, Type 2  Renal/GU      Musculoskeletal  (+) Arthritis ,   Abdominal   Peds  Hematology   Anesthesia Other Findings   Reproductive/Obstetrics                             Anesthesia Physical Anesthesia Plan  ASA: II  Anesthesia Plan: MAC   Post-op Pain Management:    Induction: Intravenous  PONV Risk Score and Plan: 2 and Ondansetron, Propofol infusion and Treatment may vary due to age or medical condition  Airway Management Planned: Simple Face Mask  Additional Equipment: None  Intra-op Plan:   Post-operative Plan:   Informed Consent: I have reviewed the patients History and Physical, chart, labs and discussed the procedure including the risks, benefits and alternatives for the proposed anesthesia with the patient or authorized representative who has indicated his/her understanding and acceptance.     Dental advisory given  Plan Discussed with: CRNA  Anesthesia Plan Comments:         Anesthesia Quick Evaluation

## 2018-07-15 NOTE — Anesthesia Postprocedure Evaluation (Signed)
Anesthesia Post Note  Patient: James Edwards  Procedure(s) Performed: NEEDLE BIOPSY TRANSRECTAL ULTRASONIC PROSTATE (TUBP) (N/A Rectum)     Patient location during evaluation: PACU Anesthesia Type: MAC Level of consciousness: patient cooperative and awake and alert Pain management: pain level controlled Vital Signs Assessment: post-procedure vital signs reviewed and stable Respiratory status: spontaneous breathing Cardiovascular status: stable Anesthetic complications: no    Last Vitals:  Vitals:   07/15/18 0925 07/15/18 0930  BP: 125/83 125/83  Pulse:  62  Resp:  15  Temp:  36.6 C  SpO2:  94%    Last Pain:  Vitals:   07/15/18 0925  TempSrc:   PainSc: 0-No pain                 Nolon Nations

## 2018-07-15 NOTE — Transfer of Care (Signed)
Immediate Anesthesia Transfer of Care Note  Patient: James Edwards  Procedure(s) Performed: Procedure(s): NEEDLE BIOPSY TRANSRECTAL ULTRASONIC PROSTATE (TUBP) (N/A)  Patient Location: PACU  Anesthesia Type:MAC  Level of Consciousness:  sedated, patient cooperative and responds to stimulation  Airway & Oxygen Therapy:Patient Spontanous Breathing and Patient connected to face mask oxgen  Post-op Assessment:  Report given to PACU RN and Post -op Vital signs reviewed and stable  Post vital signs:  Reviewed and stable  Last Vitals:  Vitals:   07/15/18 0646  BP: (!) 160/71  Pulse: 65  Resp: 18  Temp: 36.7 C  SpO2: 081%    Complications: No apparent anesthesia complications

## 2018-07-16 ENCOUNTER — Encounter (HOSPITAL_COMMUNITY): Payer: Self-pay | Admitting: Urology

## 2018-08-10 DIAGNOSIS — C61 Malignant neoplasm of prostate: Secondary | ICD-10-CM | POA: Diagnosis not present

## 2018-08-12 ENCOUNTER — Telehealth: Payer: Self-pay | Admitting: *Deleted

## 2018-08-12 NOTE — Telephone Encounter (Signed)
Offered Patient consultation by Jackquline Denmark or Telephone. Patient chose to have consultation by telephone.

## 2018-08-16 ENCOUNTER — Telehealth: Payer: Self-pay | Admitting: Radiation Oncology

## 2018-08-16 ENCOUNTER — Encounter: Payer: Self-pay | Admitting: Radiation Oncology

## 2018-08-16 NOTE — Telephone Encounter (Signed)
Left voicemail to comfirm appt and verify info.

## 2018-08-16 NOTE — Progress Notes (Signed)
GU Location of Tumor / Histology: prostatic adenocarcinoma  If Prostate Cancer, Gleason Score is (3 + 4) and PSA is (3.25). Prostate volume: 41 cc. 3 of 12 cores involved.  LEVELLE EDELEN [name] was originally diagnosed with prostate ca in 2011. He consulted with Dr. Tammi Klippel in 2011 but opted for active surveillance. He has had five biopsies since. The most recent biopsy done 07/15/18 revealed progression.  Biopsies of prostate (if applicable) revealed:   FINAL DIAGNOSIS Diagnosis 1. Prostate, needle biopsy(ies), right base lateral BENIGN PROSTATIC TISSUE 2. Prostate, needle biopsy(ies), right base medial BENIGN PROSTATIC TISSUE 3. Prostate, needle biopsy(ies), right mid lateral BENIGN PROSTATIC TISSUE 4. Prostate, needle biopsy(ies), right mid medial PROSTATIC ADENOCARCINOMA, GLEASON SCORE 3+4=7 (GRADE GROUP 2) INVOLVES 50% OF ONE CORE (PATTERN 4= 5%). 5. Prostate, needle biopsy(ies), right apex lateral BENIGN PROSTATIC TISSUE 6. Prostate, needle biopsy(ies), right apex medial PROSTATE TISSUE WITH SMALL FOCUS OF ATYPICAL GLANDS. FINDINGS SUSPICIOUS FOR CARCINOMA. 7. Prostate, needle biopsy(ies), left base lateral BENIGN PROSTATIC TISSUE 8. Prostate, needle biopsy(ies), left base medial BENIGN PROSTATIC TISSUE 9. Prostate, needle biopsy(ies), left mid lateral BENIGN PROSTATIC TISSUE 10. Prostate, needle biopsy(ies), left mid medial BENIGN PROSTATIC TISSUE 11. Prostate, needle biopsy(ies), left apex lateral SMALL FOCUS OF PROSTATIC ADENOCARCINOMA, GLEASON SCORE 3+3=6 INVOLVES 5% OF ONE CORE. 12. Prostate, needle biopsy(ies), left apex medial PROSTATIC ADENOCARCINOMA, GLEASON SCORE 3+3=6 INVOLVES 20% OF ONE CORE Past/Anticipated interventions by urology, if any: biopsy, referral to Dr. Tammi Klippel, active surveillance, refer back to Dr. Tammi Klippel  Past/Anticipated interventions by medical oncology, if any: no  Weight changes, if any: no  Bowel/Bladder complaints, if any: IPSS 7. SHIM 4.  Denies dysuria, hematuria, urinary leakage or incontinence. Denies diarrhea or constipation. Denies rectal bleeding.    Nausea/Vomiting, if any: no  Pain issues, if any:  no  SAFETY ISSUES:  Prior radiation? no  Pacemaker/ICD? no  Possible current pregnancy? no, male patient  Is the patient on methotrexate? no  Current Complaints / other details:  68 year old male. Married. 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 Denies a family hx of prostate ca. Again, leaning toward active surveillance.

## 2018-08-17 ENCOUNTER — Ambulatory Visit
Admission: RE | Admit: 2018-08-17 | Discharge: 2018-08-17 | Disposition: A | Discharge status: Home / Self Care | Payer: Medicare Other | Source: Ambulatory Visit | Attending: Radiation Oncology | Admitting: Radiation Oncology

## 2018-08-17 ENCOUNTER — Encounter: Payer: Self-pay | Admitting: Radiation Oncology

## 2018-08-17 ENCOUNTER — Other Ambulatory Visit: Payer: Self-pay

## 2018-08-17 VITALS — Ht 70.0 in | Wt 185.0 lb

## 2018-08-17 DIAGNOSIS — R972 Elevated prostate specific antigen [PSA]: Secondary | ICD-10-CM | POA: Diagnosis not present

## 2018-08-17 DIAGNOSIS — Z808 Family history of malignant neoplasm of other organs or systems: Secondary | ICD-10-CM | POA: Diagnosis not present

## 2018-08-17 DIAGNOSIS — C61 Malignant neoplasm of prostate: Secondary | ICD-10-CM

## 2018-08-17 NOTE — Progress Notes (Signed)
Radiation Oncology         [phone] ________________________________  Initial Outpatient Consultation - Conducted via telephone due to current COVID-19 concerns for limiting James Edwards exposure  Name: James Edwards MRN: [id number]  Date: 08/17/2018  DOB: February 15, 1951  NW:[id number], Tommi Rumps, NP  Raynelle Bring, MD   REFERRING PHYSICIAN: Raynelle Bring, MD  DIAGNOSIS: 68 y.o. gentleman with Stage T1c adenocarcinoma of the prostate with Gleason score of 3+4, and PSA of 3.25.    ICD-10-CM   1. Malignant neoplasm of prostate ([place])  C61   2. Prostate cancer [facility])  C61     HISTORY OF PRESENT ILLNESS: James Edwards is a 68 y.o. male with a history of prostate cancer. He was initially diagnosed with very low-risk prostate cancer in July 2011 when he was found to have a PSA of 3.53. This prompted a TRUS biopsy of the prostate which demonstrated 2/12 biopsy cores positive for Gleason 3+3 adenocarcinoma of the prostate. He met Dr. Tammi Klippel at that time, to discuss treatment options but ultimately elected active surveillance management. He has diligently continued in active surveillance with Dr. Alinda Money over the following years with close monitoring of his PSA as well as a prostate MRI in 2012 and 4 subsequent prostate biopsies that were all consistent with stable, low-risk disease. However, on his most recent surveillance transrectal ultrasound with 12 biopsies of the prostate on 07/15/2018 his disease was upgraded to 3+4.  The prostate volume measured 41 cc.  Out of 12 core biopsies, 3 were positive and showed progression with a maximum Gleason score of 3+4 seen in the right mid gland. Additionally, Gleason 3+3 disease was seen in the left apex and left apex lateral. His most recent PSA from January 2020 was 3.25.  The James Edwards reviewed the biopsy results with his urologist and he has kindly been referred back today for discussion of potential radiation treatment options.   PREVIOUS RADIATION THERAPY:  No  PAST MEDICAL HISTORY:  Past Medical History:  Diagnosis Date  . Allergy   . Arthritis   . Diabetes mellitus without complication ([place]) dx 86-57-84   type 2 diet controlled   . GERD (gastroesophageal reflux disease)   . Hypertension   . Prostate cancer [facility]) 2013   prostate      PAST SURGICAL HISTORY: Past Surgical History:  Procedure Laterality Date  . COLONOSCOPY    . colonscopy     x 2  . ELBOW SURGERY Right 2007  . POLYPECTOMY    . PROSTATE BIOPSY     x 3  . PROSTATE BIOPSY N/A 01/21/2016   Procedure: BIOPSY TRANSRECTAL ULTRASONIC PROSTATE (TUBP);  Surgeon: Raynelle Bring, MD;  Location: [facility];  Service: Urology;  Laterality: N/A;  . PROSTATE BIOPSY N/A 07/15/2018   Procedure: NEEDLE BIOPSY TRANSRECTAL ULTRASONIC PROSTATE (TUBP);  Surgeon: Raynelle Bring, MD;  Location: [facility];  Service: Urology;  Laterality: N/A;    FAMILY HISTORY:  Family History  Problem Relation Age of Onset  . Arthritis Father   . Heart disease Father   . Hypertension Father   . Bladder Cancer Father   . Colon polyps Father   . Cancer Father        bladder  . Heart disease Mother   . Hypertension Mother   . Bladder Cancer Mother   . Glaucoma Mother   . Colon cancer Neg Hx   . Esophageal cancer Neg Hx   . Rectal cancer Neg Hx   . Stomach cancer  Neg Hx   . Breast cancer Neg Hx   . Prostate cancer Neg Hx     SOCIAL HISTORY:  Social History   Socioeconomic History  . Marital status: Married    Spouse name: Not on file  . Number of children: 1  . Years of education: Not on file  . Highest education level: Not on file  Occupational History  . Not on file  Social Needs  . Financial resource strain: Not on file  . Food insecurity    Worry: Not on file    Inability: Not on file  . Transportation needs    Medical: Not on file    Non-medical: Not on file  Tobacco Use  . Smoking status: Former Smoker    Packs/day: 2.00    Years: 25.00    Pack years: 50.00    Types: Cigarettes     Quit date: 02/18/2004    Years since quitting: 14.5  . Smokeless tobacco: Never Used  . Tobacco comment: per pt stopped in 2006   Substance and Sexual Activity  . Alcohol use: Yes    Alcohol/week: 0.0 standard drinks    Comment: 10 beers per day   . Drug use: No  . Sexual activity: Not Currently  Lifestyle  . Physical activity    Days per week: Not on file    Minutes per session: Not on file  . Stress: Not on file  Relationships  . Social Herbalist on phone: Not on file    Gets together: Not on file    Attends religious service: Not on file    Active member of club or organization: Not on file    Attends meetings of clubs or organizations: Not on file    Relationship status: Not on file  . Intimate partner violence    Fear of current or ex partner: Not on file    Emotionally abused: Not on file    Physically abused: Not on file    Forced sexual activity: Not on file  Other Topics Concern  . 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    Two Children who both live locally.       He likes to sleep.     ALLERGIES: Codeine  MEDICATIONS:  Current Outpatient Medications  Medication Sig Dispense Refill  . oxybutynin (DITROPAN-XL) 10 MG 24 hr tablet Take 10 mg by mouth every evening.     . quinapril (ACCUPRIL) 40 MG tablet TAKE 1 TABLET BY MOUTH EVERY DAY (James Edwards taking differently: Take 40 mg by mouth daily. ) 90 tablet 3  . traZODone (DESYREL) 50 MG tablet Take 1 tablet (50 mg total) by mouth at bedtime as needed. (James Edwards taking differently: Take 50 mg by mouth at bedtime. ) 90 tablet 1   No current facility-administered medications for this encounter.     REVIEW OF SYSTEMS:  On review of systems, the James Edwards reports that he is doing well overall. He denies any chest pain, shortness of breath, cough, fevers, chills, night sweats, or unintended weight changes. He denies any bowel  disturbances, and denies abdominal pain, nausea or vomiting. He denies any new musculoskeletal or joint aches or pains. His IPSS was 7, indicating mild urinary symptoms. He denies dysuria, hematuria, leakage or incontinence. His SHIM was 4, indicating he does have severe erectile dysfunction. A complete review of systems  is obtained and is otherwise negative.    PHYSICAL EXAM: Not performed in light of telephone consultation format.  Wt Readings from Last 3 Encounters:  08/17/18 185 lb (83.9 kg)  07/15/18 182 lb (82.6 kg)  07/09/18 182 lb (82.6 kg)   Temp Readings from Last 3 Encounters:  07/15/18 97.8 F (36.6 C)  07/09/18 98.6 F (37 C) (Oral)  03/05/18 98.3 F (36.8 C)   BP Readings from Last 3 Encounters:  07/15/18 125/83  07/09/18 (!) 160/73  03/05/18 132/70   Pulse Readings from Last 3 Encounters:  07/15/18 62  10/07/17 (!) 54  08/24/17 70   Pain Assessment Pain Score: 0-No pain/10   LABORATORY DATA:  Lab Results  Component Value Date   WBC 7.6 07/09/2018   HGB 14.3 07/09/2018   HCT 42.6 07/09/2018   MCV 98.4 07/09/2018   PLT 255 07/09/2018   Lab Results  Component Value Date   NA 138 07/09/2018   K 4.6 07/09/2018   CL 105 07/09/2018   CO2 25 07/09/2018   Lab Results  Component Value Date   ALT 13 03/05/2018   AST 17 03/05/2018   ALKPHOS 26 (L) 03/05/2018   BILITOT 0.7 03/05/2018     RADIOGRAPHY: No results found.    IMPRESSION/PLAN: 1. 68 y.o. gentleman with Stage T1c adenocarcinoma of the prostate with Gleason Score of 3+4, and PSA of 3.25. We discussed the James Edwards's workup and outlined the nature of prostate cancer in this setting. The James Edwards's T stage, Gleason's score, and PSA put him into the favorable-intermediate risk group with low volume disease. Accordingly, he is eligible for a variety of potential treatment options including continued active surveillance, brachytherapy, 5.5 weeks of external radiation, or prostatectomy. We discussed the  available radiation techniques, and focused on the details and logistics of delivery. We discussed and outlined the risks, benefits, short and long-term effects associated with radiotherapy and compared and contrasted these with prostatectomy.  At the conclusion of our conversation the James Edwards is interested in moving forward with continued active surveillance for now. We will share this discussion with Dr. Alinda Money and look forward to following along in his progress. He will be scheduled for a repeat PSA in 3 months and a follow-up visit with Dr. Alinda Money in 6 months for repeat PSA and DRE. He is leaning towards proceeding with a seed implant in the future should his PSA continue to rise and/or any evidence of disease progression on future imaging or biopsies.  He appears to have a good understanding of his disease and is comfortable and in agreement with the stated plan.  2. ETOH abuse. The James Edwards has been educated about risk factors associated with alcohol abuse. He understands that avoidance of alcohol is important to prevent liver disease as well as other cancers and would need to be limited prior to any procedures in the future.  Given current concerns for James Edwards exposure during the COVID-19 pandemic, this encounter was conducted via telephone. The James Edwards was notified in advance and was offered a [place] meeting to allow for face to face communication but unfortunately reported that he/she did not have the appropriate resources/technology to support such a visit and instead preferred to proceed with telephone consult. The James Edwards has given verbal consent for this type of encounter. The time spent during this encounter was 45 minutes. The attendants for this meeting include Tyler Pita MD, Ashlyn Bruning PA-C, Rae Lips- scribe, and James Edwards, James Edwards. During the encounter, Tyler Pita MD,  Ashlyn Bruning PA-C, and scribe, Rae Lips were located at [facility]  Radiation Oncology Department.  James Edwards, James Edwards was located at home.    Nicholos Johns, PA-C    Tyler Pita, MD  [facility] Direct Dial: [phone]  Fax: [phone] [email or web address]  Skype  [email or web address]  This document serves as a record of services personally performed by Tyler Pita, MD and Freeman Caldron, PA-C. It was created on their behalf by Rae Lips, a trained medical scribe. The creation of this record is based on the scribe's personal observations and the providers' statements to them. This document has been checked and approved by the attending providers.

## 2018-08-17 NOTE — Progress Notes (Signed)
See progress note under physician encounter. 

## 2018-08-26 ENCOUNTER — Ambulatory Visit: Payer: Medicare Other

## 2018-08-27 ENCOUNTER — Telehealth: Payer: Self-pay | Admitting: Medical Oncology

## 2018-08-27 NOTE — Telephone Encounter (Signed)
Left message to introduce myself as the prostate nurse navigator and my role. I was unable to meet him during his consult with Dr. Tammi Klippel 6/30. He has chosen to continue active surveillance with Dr. Alinda Money. I asked him to reach out to me if I can answer questions or assist him in his care.

## 2018-09-06 ENCOUNTER — Other Ambulatory Visit: Payer: Self-pay | Admitting: Adult Health

## 2018-09-06 DIAGNOSIS — G4700 Insomnia, unspecified: Secondary | ICD-10-CM

## 2018-09-08 NOTE — Telephone Encounter (Signed)
Sent to the pharmacy by e-scribe for 6 months.  Last CPX 02/2018.  Pt doing well on med.

## 2018-09-09 DIAGNOSIS — H2513 Age-related nuclear cataract, bilateral: Secondary | ICD-10-CM | POA: Diagnosis not present

## 2018-11-17 ENCOUNTER — Telehealth: Payer: Self-pay | Admitting: Adult Health

## 2018-11-17 NOTE — Telephone Encounter (Signed)
Copied from [place] [phone]. Topic: Appointment Scheduling - Scheduling Inquiry for Clinic >> Nov 08, 2018 11:16 AM Lennox Solders wrote: Reason for CRM: pt is calling to schedule AWV   Can we schedule AWV with Tommi Rumps or are we waiting till we get a health coach?

## 2018-11-18 NOTE — Telephone Encounter (Signed)
Pt due for physical with Tommi Rumps on/after 03/07/2019.  Please schedule.  Nothing else needed to health coach comes.

## 2018-11-26 NOTE — Telephone Encounter (Signed)
Patient scheduled for 03/08/2019

## 2019-01-12 ENCOUNTER — Other Ambulatory Visit: Payer: Self-pay

## 2019-02-02 ENCOUNTER — Telehealth: Payer: Self-pay | Admitting: Adult Health

## 2019-02-02 NOTE — Telephone Encounter (Signed)
Copied from [place] [phone]. Topic: Medicare AWV >> Feb 02, 2019 10:48 AM Gerre Pebbles R wrote: Reason for CRM: Left message for patient to call back and schedule Medicare Annual Wellness Visit (AWV) either virtually,audio only or in person (whichever the patient prefers--60 MINUTES).  Last AWV 08/24/17; please schedule at anytime with [facility] Health Coach at [facility] at [place].  Ok for [facility] to schedule

## 2019-02-17 DIAGNOSIS — C61 Malignant neoplasm of prostate: Secondary | ICD-10-CM | POA: Diagnosis not present

## 2019-02-25 DIAGNOSIS — N401 Enlarged prostate with lower urinary tract symptoms: Secondary | ICD-10-CM | POA: Diagnosis not present

## 2019-02-25 DIAGNOSIS — R351 Nocturia: Secondary | ICD-10-CM | POA: Diagnosis not present

## 2019-02-25 DIAGNOSIS — C61 Malignant neoplasm of prostate: Secondary | ICD-10-CM | POA: Diagnosis not present

## 2019-03-08 ENCOUNTER — Encounter: Payer: Medicare Other | Admitting: Adult Health

## 2019-03-21 ENCOUNTER — Other Ambulatory Visit: Payer: Self-pay | Admitting: Adult Health

## 2019-03-21 DIAGNOSIS — G4700 Insomnia, unspecified: Secondary | ICD-10-CM

## 2019-04-03 ENCOUNTER — Other Ambulatory Visit: Payer: Self-pay | Admitting: Adult Health

## 2019-04-03 DIAGNOSIS — I10 Essential (primary) hypertension: Secondary | ICD-10-CM

## 2019-05-23 ENCOUNTER — Other Ambulatory Visit: Payer: Self-pay

## 2019-05-24 ENCOUNTER — Ambulatory Visit (INDEPENDENT_AMBULATORY_CARE_PROVIDER_SITE_OTHER): Payer: Medicare Other | Admitting: Adult Health

## 2019-05-24 ENCOUNTER — Other Ambulatory Visit: Payer: Self-pay | Admitting: Family Medicine

## 2019-05-24 ENCOUNTER — Encounter: Payer: Self-pay | Admitting: Adult Health

## 2019-05-24 VITALS — BP 126/80 | Temp 97.6°F | Ht 69.0 in | Wt 195.0 lb

## 2019-05-24 DIAGNOSIS — E119 Type 2 diabetes mellitus without complications: Secondary | ICD-10-CM | POA: Diagnosis not present

## 2019-05-24 DIAGNOSIS — I10 Essential (primary) hypertension: Secondary | ICD-10-CM

## 2019-05-24 DIAGNOSIS — G4709 Other insomnia: Secondary | ICD-10-CM | POA: Diagnosis not present

## 2019-05-24 DIAGNOSIS — E782 Mixed hyperlipidemia: Secondary | ICD-10-CM

## 2019-05-24 DIAGNOSIS — C61 Malignant neoplasm of prostate: Secondary | ICD-10-CM | POA: Diagnosis not present

## 2019-05-24 LAB — CBC WITH DIFFERENTIAL/PLATELET
Basophils Absolute: 0 10*3/uL (ref 0.0–0.1)
Basophils Relative: 0.7 % (ref 0.0–3.0)
Eosinophils Absolute: 0.5 10*3/uL (ref 0.0–0.7)
Eosinophils Relative: 7.7 % — ABNORMAL HIGH (ref 0.0–5.0)
HCT: 40.2 % (ref 39.0–52.0)
Hemoglobin: 13.7 g/dL (ref 13.0–17.0)
Lymphocytes Relative: 32.4 % (ref 12.0–46.0)
Lymphs Abs: 2.2 10*3/uL (ref 0.7–4.0)
MCHC: 34 g/dL (ref 30.0–36.0)
MCV: 96.9 fl (ref 78.0–100.0)
Monocytes Absolute: 0.8 10*3/uL (ref 0.1–1.0)
Monocytes Relative: 11.4 % (ref 3.0–12.0)
Neutro Abs: 3.2 10*3/uL (ref 1.4–7.7)
Neutrophils Relative %: 47.8 % (ref 43.0–77.0)
Platelets: 238 10*3/uL (ref 150.0–400.0)
RBC: 4.15 Mil/uL — ABNORMAL LOW (ref 4.22–5.81)
RDW: 13.7 % (ref 11.5–15.5)
WBC: 6.7 10*3/uL (ref 4.0–10.5)

## 2019-05-24 LAB — COMPREHENSIVE METABOLIC PANEL
ALT: 14 U/L (ref 0–53)
AST: 17 U/L (ref 0–37)
Albumin: 4.1 g/dL (ref 3.5–5.2)
Alkaline Phosphatase: 26 U/L — ABNORMAL LOW (ref 39–117)
BUN: 11 mg/dL (ref 6–23)
CO2: 28 mEq/L (ref 19–32)
Calcium: 10.4 mg/dL (ref 8.4–10.5)
Chloride: 102 mEq/L (ref 96–112)
Creatinine, Ser: 0.85 mg/dL (ref 0.40–1.50)
GFR: 89.2 mL/min (ref >60.00)
Glucose, Bld: 123 mg/dL — ABNORMAL HIGH (ref 70–99)
Potassium: 4.9 mEq/L (ref 3.5–5.1)
Sodium: 137 mEq/L (ref 135–145)
Total Bilirubin: 0.6 mg/dL (ref 0.2–1.2)
Total Protein: 6.6 g/dL (ref 6.0–8.3)

## 2019-05-24 LAB — LIPID PANEL
Cholesterol: 211 mg/dL — ABNORMAL HIGH (ref 0–200)
HDL: 66.3 mg/dL (ref >39.00)
LDL Cholesterol: 125 mg/dL — ABNORMAL HIGH (ref 0–99)
NonHDL: 144.31
Total CHOL/HDL Ratio: 3
Triglycerides: 97 mg/dL (ref 0.0–149.0)
VLDL: 19.4 mg/dL (ref 0.0–40.0)

## 2019-05-24 LAB — TSH: TSH: 1.12 u[IU]/mL (ref 0.35–4.50)

## 2019-05-24 LAB — HEMOGLOBIN A1C: Hgb A1c MFr Bld: 5.8 % (ref 4.6–6.5)

## 2019-05-24 MED ORDER — QUINAPRIL HCL 40 MG PO TABS: 40.0000 mg | ORAL_TABLET | Freq: Every day | ORAL | 3 refills | Status: DC

## 2019-05-24 MED ORDER — ATORVASTATIN CALCIUM 10 MG PO TABS: 10.0000 mg | ORAL_TABLET | Freq: Every day | ORAL | 3 refills | Status: DC

## 2019-05-24 NOTE — Patient Instructions (Signed)
It was great seeing you today   We will follow up with you regarding your blood work   Please let me know if you need anything over the next year   Work on diet and exercise to help lose weight

## 2019-05-24 NOTE — Progress Notes (Signed)
Subjective:    Patient ID: James Edwards, male    DOB: 22-Feb-1950, 69 y.o.   MRN: [id number]  HPI  Patient presents for yearly preventative medicine examination. He is a pleasant 69 year old male who  has a past medical history of Allergy, Arthritis, Diabetes mellitus without complication ([place]) (dx 123456), GERD (gastroesophageal reflux disease), Hypertension, and Prostate cancer ([place]) (2013).  Essential Hypertension -takes Accupril 40 mg daily.  He denies dizziness, lightheadedness, chest pain, or shortness of breath BP Readings from Last 3 Encounters:  05/24/19 126/80  07/15/18 125/83  07/09/18 (!) 160/73   DM - Diet controlled in the past  Lab Results  Component Value Date   HGBA1C 5.4 07/09/2018   H/o prostate cancer -is followed by Dr. Alinda Money at [facility]. He is on active observation.  He is currently prescribed Ditropan 10 mg for BPH symptoms.  Insomnia - transient - takes Trazodone 50 mg nightly.   All immunizations and health maintenance protocols were reviewed with the patient and needed orders were placed.  Appropriate screening laboratory values were ordered for the patient including screening of hyperlipidemia, renal function and hepatic function.  Medication reconciliation,  past medical history, social history, problem list and allergies were reviewed in detail with the patient  Goals were established with regard to weight loss, exercise, and  diet in compliance with medications Wt Readings from Last 3 Encounters:  05/24/19 195 lb (88.5 kg)  08/17/18 185 lb (83.9 kg)  07/15/18 182 lb (82.6 kg)     Review of Systems  Constitutional: Negative.   HENT: Negative.   Eyes: Negative.   Respiratory: Negative.   Cardiovascular: Negative.   Gastrointestinal: Negative.   Endocrine: Negative.   Genitourinary: Negative.   Musculoskeletal: Negative.   Skin: Negative.   Allergic/Immunologic: Negative.   Neurological: Negative.   Hematological: Negative.    Psychiatric/Behavioral: Negative.   All other systems reviewed and are negative.    Past Medical History:  Diagnosis Date  . Allergy   . Arthritis   . Diabetes mellitus without complication ([place]) dx 123456   type 2 diet controlled   . GERD (gastroesophageal reflux disease)   . Hypertension   . Prostate cancer [facility]) 2013   prostate    Social History   Socioeconomic History  . Marital status: Married    Spouse name: Not on file  . Number of children: 1  . Years of education: Not on file  . Highest education level: Not on file  Occupational History  . Not on file  Tobacco Use  . Smoking status: Former Smoker    Packs/day: 2.00    Years: 25.00    Pack years: 50.00    Types: Cigarettes    Quit date: 02/18/2004    Years since quitting: 15.2  . Smokeless tobacco: Never Used  . Tobacco comment: per pt stopped in 2006   Substance and Sexual Activity  . Alcohol use: Yes    Alcohol/week: 0.0 standard drinks    Comment: 10 beers per day   . Drug use: No  . Sexual activity: Not Currently  Other Topics Concern  . 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    Two Children who both live locally.       He likes to sleep.    Social Determinants of Health   Financial Resource Strain:   .  Difficulty of Paying Living Expenses:   Food Insecurity:   . Worried About Charity fundraiser in the Last Year:   . Arboriculturist in the Last Year:   Transportation Needs:   . Film/video editor (Medical):   Marland Kitchen Lack of Transportation (Non-Medical):   Physical Activity:   . Days of Exercise per Week:   . Minutes of Exercise per Session:   Stress:   . Feeling of Stress :   Social Connections:   . Frequency of Communication with Friends and Family:   . Frequency of Social Gatherings with Friends and Family:   . Attends Religious Services:   . Active Member of Clubs or Organizations:   . Attends Theatre manager Meetings:   Marland Kitchen Marital Status:   Intimate Partner Violence:   . Fear of Current or Ex-Partner:   . Emotionally Abused:   Marland Kitchen Physically Abused:   . Sexually Abused:     Past Surgical History:  Procedure Laterality Date  . COLONOSCOPY    . colonscopy     x 2  . ELBOW SURGERY Right 2007  . POLYPECTOMY    . PROSTATE BIOPSY     x 3  . PROSTATE BIOPSY N/A 01/21/2016   Procedure: BIOPSY TRANSRECTAL ULTRASONIC PROSTATE (TUBP);  Surgeon: Raynelle Bring, MD;  Location: [facility];  Service: Urology;  Laterality: N/A;  . PROSTATE BIOPSY N/A 07/15/2018   Procedure: NEEDLE BIOPSY TRANSRECTAL ULTRASONIC PROSTATE (TUBP);  Surgeon: Raynelle Bring, MD;  Location: [facility];  Service: Urology;  Laterality: N/A;    Family History  Problem Relation Age of Onset  . Arthritis Father   . Heart disease Father   . Hypertension Father   . Bladder Cancer Father   . Colon polyps Father   . Cancer Father        bladder  . Heart disease Mother   . Hypertension Mother   . Bladder Cancer Mother   . Glaucoma Mother   . Colon cancer Neg Hx   . Esophageal cancer Neg Hx   . Rectal cancer Neg Hx   . Stomach cancer Neg Hx   . Breast cancer Neg Hx   . Prostate cancer Neg Hx     Allergies  Allergen Reactions  . Codeine Nausea And Vomiting    Current Outpatient Medications on File Prior to Visit  Medication Sig Dispense Refill  . Multiple Vitamin (MULTIVITAMIN) tablet Take 1 tablet by mouth daily.    Marland Kitchen OVER THE COUNTER MEDICATION Gin Raisin for joint health    . oxybutynin (DITROPAN-XL) 10 MG 24 hr tablet Take 10 mg by mouth every evening.     . traZODone (DESYREL) 50 MG tablet TAKE 1 TABLET (50 MG TOTAL) BY MOUTH AT BEDTIME AS NEEDED. 90 tablet 1  . TURMERIC PO Take by mouth.     No current facility-administered medications on file prior to visit.    BP 126/80   Temp 97.6 F (36.4 C) (Temporal)   Ht 5\' 9"  (1.753 m)   Wt 195 lb (88.5 kg)   BMI 28.80 kg/m       Objective:   Physical  Exam Vitals and nursing note reviewed.  Constitutional:      General: He is not in acute distress.    Appearance: Normal appearance. He is well-developed. He is morbidly obese.  HENT:     Head: Normocephalic and atraumatic.     Right Ear: Tympanic membrane, ear canal and external ear normal.  There is no impacted cerumen.     Left Ear: Tympanic membrane, ear canal and external ear normal. There is no impacted cerumen.     Nose: Nose normal. No congestion or rhinorrhea.     Mouth/Throat:     Mouth: Mucous membranes are moist.     Pharynx: Oropharynx is clear. No oropharyngeal exudate or posterior oropharyngeal erythema.  Eyes:     General:        Right eye: No discharge.        Left eye: No discharge.     Extraocular Movements: Extraocular movements intact.     Conjunctiva/sclera: Conjunctivae normal.     Pupils: Pupils are equal, round, and reactive to light.  Neck:     Vascular: No carotid bruit.     Trachea: No tracheal deviation.  Cardiovascular:     Rate and Rhythm: Normal rate and regular rhythm.     Pulses: Normal pulses.     Heart sounds: Normal heart sounds. No murmur. No friction rub. No gallop.   Pulmonary:     Effort: Pulmonary effort is normal. No respiratory distress.     Breath sounds: Normal breath sounds. No stridor. No wheezing, rhonchi or rales.  Chest:     Chest wall: No tenderness.  Abdominal:     General: Bowel sounds are normal. There is no distension.     Palpations: Abdomen is soft. There is no mass.     Tenderness: There is no abdominal tenderness. There is no right CVA tenderness, left CVA tenderness, guarding or rebound.     Hernia: No hernia is present.  Musculoskeletal:        General: No swelling, tenderness, deformity or signs of injury. Normal range of motion.     Right lower leg: No edema.     Left lower leg: No edema.  Lymphadenopathy:     Cervical: No cervical adenopathy.  Skin:    General: Skin is warm and dry.     Capillary Refill:  Capillary refill takes less than 2 seconds.     Coloration: Skin is not jaundiced or pale.     Findings: No bruising, erythema, lesion or rash.  Neurological:     General: No focal deficit present.     Mental Status: He is alert and oriented to person, place, and time.     Cranial Nerves: No cranial nerve deficit.     Sensory: No sensory deficit.     Motor: No weakness.     Coordination: Coordination normal.     Gait: Gait normal.     Deep Tendon Reflexes: Reflexes normal.  Psychiatric:        Mood and Affect: Mood normal.        Behavior: Behavior normal.        Thought Content: Thought content normal.        Judgment: Judgment normal.       Assessment & Plan:  1. Type 2 diabetes mellitus without complication, without long-term current use of insulin ([place]) - Consider adding Metformin  - CBC with Differential/Platelet - Comprehensive metabolic panel - Hemoglobin A1c - Lipid panel - TSH  2. Essential hypertension - No change in medications  - At goal  - CBC with Differential/Platelet - Comprehensive metabolic panel - Hemoglobin A1c - Lipid panel - TSH - quinapril (ACCUPRIL) 40 MG tablet; Take 1 tablet (40 mg total) by mouth daily.  Dispense: 90 tablet; Refill: 3  3. Mixed hyperlipidemia - Consider statin  - CBC  with Differential/Platelet - Comprehensive metabolic panel - Hemoglobin A1c - Lipid panel - TSH  4. Other insomnia - Continue Trazodone   5. Prostate cancer [facility]) - follow up with Urology as directed  Dorothyann Peng, NP

## 2019-06-06 ENCOUNTER — Encounter: Payer: Self-pay | Admitting: Adult Health

## 2019-06-07 NOTE — Telephone Encounter (Signed)
Please schedule for app.   Thank You

## 2019-06-08 ENCOUNTER — Other Ambulatory Visit: Payer: Self-pay

## 2019-06-09 ENCOUNTER — Ambulatory Visit (INDEPENDENT_AMBULATORY_CARE_PROVIDER_SITE_OTHER): Payer: Medicare Other | Admitting: Adult Health

## 2019-06-09 ENCOUNTER — Encounter: Payer: Self-pay | Admitting: Adult Health

## 2019-06-09 VITALS — BP 140/64 | HR 57 | Temp 98.4°F | Wt 199.6 lb

## 2019-06-09 DIAGNOSIS — K122 Cellulitis and abscess of mouth: Secondary | ICD-10-CM

## 2019-06-09 MED ORDER — AMOXICILLIN-POT CLAVULANATE 875-125 MG PO TABS: 1.0000 | ORAL_TABLET | Freq: Two times a day (BID) | ORAL | 0 refills | Status: DC

## 2019-06-09 MED ORDER — HYDROCODONE-ACETAMINOPHEN 10-325 MG PO TABS: 1.0000 | ORAL_TABLET | Freq: Three times a day (TID) | ORAL | 0 refills | Status: AC | PRN

## 2019-06-09 NOTE — Progress Notes (Signed)
Subjective:    Patient ID: James Edwards, male    DOB: March 10, 1950, 69 y.o.   MRN: [id number]  HPI  69 year old male who  has a past medical history of Allergy, Arthritis, Diabetes mellitus without complication ([place]) (dx 123456), GERD (gastroesophageal reflux disease), Hypertension, and Prostate cancer ([place]) (2013).  He presents to the office today for left sided facial swelling that started 6 days ago. He does report pain on the inside of his mouth and foul taste. Denies fevers or chills  Review of Systems See HPI   Past Medical History:  Diagnosis Date  . Allergy   . Arthritis   . Diabetes mellitus without complication ([place]) dx 123456   type 2 diet controlled   . GERD (gastroesophageal reflux disease)   . Hypertension   . Prostate cancer [facility]) 2013   prostate    Social History   Socioeconomic History  . Marital status: Married    Spouse name: Not on file  . Number of children: 1  . Years of education: Not on file  . Highest education level: Not on file  Occupational History  . Not on file  Tobacco Use  . Smoking status: Former Smoker    Packs/day: 2.00    Years: 25.00    Pack years: 50.00    Types: Cigarettes    Quit date: 02/18/2004    Years since quitting: 15.3  . Smokeless tobacco: Never Used  . Tobacco comment: per pt stopped in 2006   Substance and Sexual Activity  . Alcohol use: Yes    Alcohol/week: 0.0 standard drinks    Comment: 10 beers per day   . Drug use: No  . Sexual activity: Not Currently  Other Topics Concern  . 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    Two Children who both live locally.       He likes to sleep.    Social Determinants of Health   Financial Resource Strain:   . Difficulty of Paying Living Expenses:   Food Insecurity:   . Worried About Charity fundraiser in the Last Year:   . Arboriculturist in the Last Year:   Transportation Needs:    . Film/video editor (Medical):   Marland Kitchen Lack of Transportation (Non-Medical):   Physical Activity:   . Days of Exercise per Week:   . Minutes of Exercise per Session:   Stress:   . Feeling of Stress :   Social Connections:   . Frequency of Communication with Friends and Family:   . Frequency of Social Gatherings with Friends and Family:   . Attends Religious Services:   . Active Member of Clubs or Organizations:   . Attends Archivist Meetings:   Marland Kitchen Marital Status:   Intimate Partner Violence:   . Fear of Current or Ex-Partner:   . Emotionally Abused:   Marland Kitchen Physically Abused:   . Sexually Abused:     Past Surgical History:  Procedure Laterality Date  . COLONOSCOPY    . colonscopy     x 2  . ELBOW SURGERY Right 2007  . POLYPECTOMY    . PROSTATE BIOPSY     x 3  . PROSTATE BIOPSY N/A 01/21/2016   Procedure: BIOPSY TRANSRECTAL ULTRASONIC PROSTATE (TUBP);  Surgeon: Raynelle Bring, MD;  [facility];  Service: Urology;  Laterality:  N/A;  . PROSTATE BIOPSY N/A 07/15/2018   Procedure: NEEDLE BIOPSY TRANSRECTAL ULTRASONIC PROSTATE (TUBP);  Surgeon: Raynelle Bring, MD;  [facility];  Service: Urology;  Laterality: N/A;    Family History  Problem Relation Age of Onset  . Arthritis Father   . Heart disease Father   . Hypertension Father   . Bladder Cancer Father   . Colon polyps Father   . Cancer Father        bladder  . Heart disease Mother   . Hypertension Mother   . Bladder Cancer Mother   . Glaucoma Mother   . Colon cancer Neg Hx   . Esophageal cancer Neg Hx   . Rectal cancer Neg Hx   . Stomach cancer Neg Hx   . Breast cancer Neg Hx   . Prostate cancer Neg Hx     Allergies  Allergen Reactions  . Codeine Nausea And Vomiting    Current Outpatient Medications on File Prior to Visit  Medication Sig Dispense Refill  . atorvastatin (LIPITOR) 10 MG tablet Take 1 tablet (10 mg total) by mouth daily. 90 tablet 3  . Multiple Vitamin (MULTIVITAMIN) tablet  Take 1 tablet by mouth daily.    Marland Kitchen OVER THE COUNTER MEDICATION Gin Raisin for joint health    . oxybutynin (DITROPAN-XL) 10 MG 24 hr tablet Take 10 mg by mouth every evening.     . quinapril (ACCUPRIL) 40 MG tablet Take 1 tablet (40 mg total) by mouth daily. 90 tablet 3  . traZODone (DESYREL) 50 MG tablet TAKE 1 TABLET (50 MG TOTAL) BY MOUTH AT BEDTIME AS NEEDED. 90 tablet 1  . TURMERIC PO Take by mouth.     No current facility-administered medications on file prior to visit.    BP 140/64 (BP Location: Left Arm, Patient Position: Sitting, Cuff Size: Normal)   Pulse (!) 57   Temp 98.4 F (36.9 C) (Temporal)   Wt 199 lb 9.6 oz (90.5 kg)   SpO2 98%   BMI 29.48 kg/m       Objective:   Physical Exam Vitals and nursing note reviewed.  Constitutional:      Appearance: Normal appearance.  HENT:     Head:      Comments: Swelling, tenderness, and warmth noted.     Mouth/Throat:     Dentition: Abnormal dentition. Dental caries and dental abscesses present.      Comments: + purulent drainage   Musculoskeletal:        General: No swelling. Normal range of motion.  Skin:    General: Skin is warm and dry.     Capillary Refill: Capillary refill takes less than 2 seconds.  Neurological:     General: No focal deficit present.     Mental Status: He is alert and oriented to person, place, and time.       Assessment & Plan:  1. Dental abscess -Sure was applied to the dental abscess and purulent drainage was able to be removed with pressure.  Patient did feel relief after abscess was drained.  Phone number was given for local dentists follow-up with.  Start him on Augmentin and prescribe short course of Norco for pain relief. - amoxicillin-clavulanate (AUGMENTIN) 875-125 MG tablet; Take 1 tablet by mouth 2 (two) times daily.  Dispense: 20 tablet; Refill: 0 - HYDROcodone-acetaminophen (NORCO) 10-325 MG tablet; Take 1 tablet by mouth every 8 (eight) hours as needed for up to 5 days.   Dispense: 15 tablet; Refill: 0 -  Follow up PRN    Time spent  with patient; discussing dental abscess treatment, drainage of abscess for symptom relief, and documentation 40 minutes

## 2019-06-09 NOTE — Patient Instructions (Addendum)
Dr. Boykin Nearing - [facility]  Address: [address], [place], [place] [address] Phone: [phone]  James Edwards  Address: [address] [place], [place] [address] Phone: [phone]

## 2019-06-13 DIAGNOSIS — C61 Malignant neoplasm of prostate: Secondary | ICD-10-CM | POA: Diagnosis not present

## 2019-08-26 DIAGNOSIS — C61 Malignant neoplasm of prostate: Secondary | ICD-10-CM | POA: Diagnosis not present

## 2019-08-26 LAB — PSA: PSA: 5.27

## 2019-09-02 DIAGNOSIS — N401 Enlarged prostate with lower urinary tract symptoms: Secondary | ICD-10-CM | POA: Diagnosis not present

## 2019-09-02 DIAGNOSIS — C61 Malignant neoplasm of prostate: Secondary | ICD-10-CM | POA: Diagnosis not present

## 2019-09-02 DIAGNOSIS — R3915 Urgency of urination: Secondary | ICD-10-CM | POA: Diagnosis not present

## 2019-09-11 ENCOUNTER — Other Ambulatory Visit: Payer: Self-pay | Admitting: Adult Health

## 2019-09-11 DIAGNOSIS — G4700 Insomnia, unspecified: Secondary | ICD-10-CM

## 2019-09-13 NOTE — Telephone Encounter (Signed)
SENT TO THE PHARMACY BY E-SCRIBE. 

## 2019-09-21 ENCOUNTER — Encounter: Payer: Self-pay | Admitting: Family Medicine

## 2019-10-05 ENCOUNTER — Other Ambulatory Visit: Payer: Self-pay | Admitting: Adult Health

## 2019-10-05 DIAGNOSIS — I10 Essential (primary) hypertension: Secondary | ICD-10-CM

## 2019-10-25 ENCOUNTER — Telehealth: Payer: Self-pay | Admitting: Adult Health

## 2019-10-25 ENCOUNTER — Other Ambulatory Visit: Payer: Self-pay

## 2019-10-25 NOTE — Progress Notes (Signed)
Erroneous encounter

## 2019-10-25 NOTE — Telephone Encounter (Signed)
Attempted to reach patient x3 and left voicemails x3 for medicare wellness visit on today 10/25/2019 @ 9:00 am. On third message I advised patient to call the front desk at the office and reschedule his medicare wellness visit

## 2019-10-26 ENCOUNTER — Telehealth: Payer: Self-pay | Admitting: Adult Health

## 2019-10-26 ENCOUNTER — Other Ambulatory Visit: Payer: Self-pay

## 2019-10-26 ENCOUNTER — Ambulatory Visit (INDEPENDENT_AMBULATORY_CARE_PROVIDER_SITE_OTHER): Payer: Medicare Other

## 2019-10-26 DIAGNOSIS — Z0000 Encounter for general adult medical examination without abnormal findings: Secondary | ICD-10-CM

## 2019-10-26 NOTE — Progress Notes (Signed)
  Chronic Care Management   Note  10/26/2019 Name: Precious Gilchrest MRN: [id number] DOB: 10-25-1950  Duane Lope Peyton Spengler is a 69 y.o. year old male who is a primary care patient of Dorothyann Peng, NP. I reached out to [place] by phone today in response to a referral sent by Mr. Lisette Grinder [name]'s PCP, Dorothyann Peng, NP.   Mr. Vernis Eid was given information about Chronic Care Management services today including:  1. CCM service includes personalized support from designated clinical staff supervised by his physician, including individualized plan of care and coordination with other care providers 2. 24/7 contact phone numbers for assistance for urgent and routine care needs. 3. Service will only be billed when office clinical staff spend 20 minutes or more in a month to coordinate care. 4. Only one practitioner may furnish and bill the service in a calendar month. 5. The patient may stop CCM services at any time (effective at the end of the month) by phone call to the office staff.   Patient agreed to services and verbal consent obtained.   Follow up plan:   Carley Perdue [employer] Scheduler

## 2019-10-26 NOTE — Patient Instructions (Signed)
James Edwards , Thank you for taking time to come for your Medicare Wellness Visit. I appreciate your ongoing commitment to your health goals. Please review the following plan we discussed and let me know if I can assist you in the future.   Screening recommendations/referrals: Colonoscopy: Up to date, next due 10/08/2022 Recommended yearly ophthalmology/optometry visit for glaucoma screening and checkup Recommended yearly dental visit for hygiene and checkup  Vaccinations: Influenza vaccine: Currently due, you may receive at our office or at your local pharmacy. Pneumococcal vaccine: Completed series Tdap vaccine: Currently due, you may contact your insurance company to discuss cost or you may await and injury to receive  Shingles vaccine: Currently due, you may contact your pharmacy to discuss cost and to receive vaccine series.    Advanced directives: Please bring a copy of your medical advanced directives into the office so that we may scan them into your chart.  Conditions/risks identified: None   Next appointment: None   Preventive Care 65 Years and Older, Male Preventive care refers to lifestyle choices and visits with your health care provider that can promote health and wellness. What does preventive care include?  A yearly physical exam. This is also called an annual well check.  Dental exams once or twice a year.  Routine eye exams. Ask your health care provider how often you should have your eyes checked.  Personal lifestyle choices, including:  Daily care of your teeth and gums.  Regular physical activity.  Eating a healthy diet.  Avoiding tobacco and drug use.  Limiting alcohol use.  Practicing safe sex.  Taking low doses of aspirin every day.  Taking vitamin and mineral supplements as recommended by your health care provider. What happens during an annual well check? The services and screenings done by your health care provider during your annual well  check will depend on your age, overall health, lifestyle risk factors, and family history of disease. Counseling  Your health care provider may ask you questions about your:  Alcohol use.  Tobacco use.  Drug use.  Emotional well-being.  Home and relationship well-being.  Sexual activity.  Eating habits.  History of falls.  Memory and ability to understand (cognition).  Work and work Statistician. Screening  You may have the following tests or measurements:  Height, weight, and BMI.  Blood pressure.  Lipid and cholesterol levels. These may be checked every 5 years, or more frequently if you are over 62 years old.  Skin check.  Lung cancer screening. You may have this screening every year starting at age 45 if you have a 30-pack-year history of smoking and currently smoke or have quit within the past 15 years.  Fecal occult blood test (FOBT) of the stool. You may have this test every year starting at age 29.  Flexible sigmoidoscopy or colonoscopy. You may have a sigmoidoscopy every 5 years or a colonoscopy every 10 years starting at age 69.  Prostate cancer screening. Recommendations will vary depending on your family history and other risks.  Hepatitis C blood test.  Hepatitis B blood test.  Sexually transmitted disease (STD) testing.  Diabetes screening. This is done by checking your blood sugar (glucose) after you have not eaten for a while (fasting). You may have this done every 1-3 years.  Abdominal aortic aneurysm (AAA) screening. You may need this if you are a current or former smoker.  Osteoporosis. You may be screened starting at age 54 if you are at high risk. Talk with  your health care provider about your test results, treatment options, and if necessary, the need for more tests. Vaccines  Your health care provider may recommend certain vaccines, such as:  Influenza vaccine. This is recommended every year.  Tetanus, diphtheria, and acellular  pertussis (Tdap, Td) vaccine. You may need a Td booster every 10 years.  Zoster vaccine. You may need this after age 54.  Pneumococcal 13-valent conjugate (PCV13) vaccine. One dose is recommended after age 7.  Pneumococcal polysaccharide (PPSV23) vaccine. One dose is recommended after age 34. Talk to your health care provider about which screenings and vaccines you need and how often you need them. This information is not intended to replace advice given to you by your health care provider. Make sure you discuss any questions you have with your health care provider. Document Released: 03/02/2015 Document Revised: 10/24/2015 Document Reviewed: 12/05/2014 Elsevier Interactive Patient Education  2017 [employer] Prevention in the Home Falls can cause injuries. They can happen to people of all ages. There are many things you can do to make your home safe and to help prevent falls. What can I do on the outside of my home?  Regularly fix the edges of walkways and driveways and fix any cracks.  Remove anything that might make you trip as you walk through a door, such as a raised step or threshold.  Trim any bushes or trees on the path to your home.  Use bright outdoor lighting.  Clear any walking paths of anything that might make someone trip, such as rocks or tools.  Regularly check to see if handrails are loose or broken. Make sure that both sides of any steps have handrails.  Any raised decks and porches should have guardrails on the edges.  Have any leaves, snow, or ice cleared regularly.  Use sand or salt on walking paths during winter.  Clean up any spills in your garage right away. This includes oil or grease spills. What can I do in the bathroom?  Use night lights.  Install grab bars by the toilet and in the tub and shower. Do not use towel bars as grab bars.  Use non-skid mats or decals in the tub or shower.  If you need to sit down in the shower, use a plastic,  non-slip stool.  Keep the floor dry. Clean up any water that spills on the floor as soon as it happens.  Remove soap buildup in the tub or shower regularly.  Attach bath mats securely with double-sided non-slip rug tape.  Do not have throw rugs and other things on the floor that can make you trip. What can I do in the bedroom?  Use night lights.  Make sure that you have a light by your bed that is easy to reach.  Do not use any sheets or blankets that are too big for your bed. They should not hang down onto the floor.  Have a firm chair that has side arms. You can use this for support while you get dressed.  Do not have throw rugs and other things on the floor that can make you trip. What can I do in the kitchen?  Clean up any spills right away.  Avoid walking on wet floors.  Keep items that you use a lot in easy-to-reach places.  If you need to reach something above you, use a strong step stool that has a grab bar.  Keep electrical cords out of the way.  Do not  use floor polish or wax that makes floors slippery. If you must use wax, use non-skid floor wax.  Do not have throw rugs and other things on the floor that can make you trip. What can I do with my stairs?  Do not leave any items on the stairs.  Make sure that there are handrails on both sides of the stairs and use them. Fix handrails that are broken or loose. Make sure that handrails are as long as the stairways.  Check any carpeting to make sure that it is firmly attached to the stairs. Fix any carpet that is loose or worn.  Avoid having throw rugs at the top or bottom of the stairs. If you do have throw rugs, attach them to the floor with carpet tape.  Make sure that you have a light switch at the top of the stairs and the bottom of the stairs. If you do not have them, ask someone to add them for you. What else can I do to help prevent falls?  Wear shoes that:  Do not have high heels.  Have rubber  bottoms.  Are comfortable and fit you well.  Are closed at the toe. Do not wear sandals.  If you use a stepladder:  Make sure that it is fully opened. Do not climb a closed stepladder.  Make sure that both sides of the stepladder are locked into place.  Ask someone to hold it for you, if possible.  Clearly mark and make sure that you can see:  Any grab bars or handrails.  First and last steps.  Where the edge of each step is.  Use tools that help you move around (mobility aids) if they are needed. These include:  Canes.  Walkers.  Scooters.  Crutches.  Turn on the lights when you go into a dark area. Replace any light bulbs as soon as they burn out.  Set up your furniture so you have a clear path. Avoid moving your furniture around.  If any of your floors are uneven, fix them.  If there are any pets around you, be aware of where they are.  Review your medicines with your doctor. Some medicines can make you feel dizzy. This can increase your chance of falling. Ask your doctor what other things that you can do to help prevent falls. This information is not intended to replace advice given to you by your health care provider. Make sure you discuss any questions you have with your health care provider. Document Released: 11/30/2008 Document Revised: 07/12/2015 Document Reviewed: 03/10/2014 Elsevier Interactive Patient Education  2017 [employer].

## 2019-10-26 NOTE — Progress Notes (Signed)
Subjective:   James Edwards is a 69 y.o. male who presents for Medicare Annual/Subsequent preventive examination. I connected with Dayna Barker today by telephone and verified that I am speaking with the correct person using two identifiers. Location patient: home Location provider: work Persons participating in the virtual visit: patient, provider.   I discussed the limitations, risks, security and privacy concerns of performing an evaluation and management service by telephone and the availability of in person appointments. I also discussed with the patient that there may be a patient responsible charge related to this service. The patient expressed understanding and verbally consented to this telephonic visit.    Interactive audio and video telecommunications were attempted between this provider and patient, however failed, due to patient having technical difficulties OR patient did not have access to video capability.  We continued and completed visit with audio only.      Review of Systems    N/A Cardiac Risk Factors include: dyslipidemia;hypertension;advanced age (>22men, >7 women);male gender     Objective:    Today's Vitals   There is no height or weight on file to calculate BMI.  Advanced Directives 10/26/2019 08/17/2018 07/15/2018 07/09/2018 08/24/2017 08/18/2016 01/21/2016  Does Patient Have a Medical Advance Directive? Yes No Yes Yes No No No  Type of Paramedic of [place];Living will - Living will Living will - - -  Does patient want to make changes to medical advance directive? No - Patient declined - No - Patient declined No - Patient declined - - -  Copy of [place] in Chart? No - copy requested - - - - - -  Would patient like information on creating a medical advance directive? - No - Patient declined - - - No - Patient declined No - Patient declined    Current Medications (verified) Outpatient Encounter Medications as  of 10/26/2019  Medication Sig  . Multiple Vitamin (MULTIVITAMIN) tablet Take 1 tablet by mouth daily.  Marland Kitchen OVER THE COUNTER MEDICATION Gin Raisin for joint health  . oxybutynin (DITROPAN-XL) 10 MG 24 hr tablet Take 10 mg by mouth every evening.   . quinapril (ACCUPRIL) 40 MG tablet TAKE 1 TABLET BY MOUTH EVERY DAY  . traZODone (DESYREL) 50 MG tablet TAKE 1 TABLET (50 MG TOTAL) BY MOUTH AT BEDTIME AS NEEDED.  Marland Kitchen TURMERIC PO Take by mouth.  . [DISCONTINUED] amoxicillin-clavulanate (AUGMENTIN) 875-125 MG tablet Take 1 tablet by mouth 2 (two) times daily.  . [DISCONTINUED] atorvastatin (LIPITOR) 10 MG tablet Take 1 tablet (10 mg total) by mouth daily. (Patient not taking: Reported on 10/26/2019)   No facility-administered encounter medications on file as of 10/26/2019.    Allergies (verified) Codeine   History: Past Medical History:  Diagnosis Date  . Allergy   . Arthritis   . Diabetes mellitus without complication ([place]) dx 65-46-50   type 2 diet controlled   . GERD (gastroesophageal reflux disease)   . Hypertension   . Prostate cancer [facility]) 2013   prostate   Past Surgical History:  Procedure Laterality Date  . COLONOSCOPY    . colonscopy     x 2  . ELBOW SURGERY Right 2007  . POLYPECTOMY    . PROSTATE BIOPSY     x 3  . PROSTATE BIOPSY N/A 01/21/2016   Procedure: BIOPSY TRANSRECTAL ULTRASONIC PROSTATE (TUBP);  Surgeon: Raynelle Bring, MD;  Location: [facility];  Service: Urology;  Laterality: N/A;  . PROSTATE BIOPSY N/A 07/15/2018  Procedure: NEEDLE BIOPSY TRANSRECTAL ULTRASONIC PROSTATE (TUBP);  Surgeon: Raynelle Bring, MD;  Location: [facility];  Service: Urology;  Laterality: N/A;   Family History  Problem Relation Age of Onset  . Arthritis Father   . Heart disease Father   . Hypertension Father   . Bladder Cancer Father   . Colon polyps Father   . Cancer Father        bladder  . Heart disease Mother   . Hypertension Mother   . Bladder Cancer Mother   . Glaucoma Mother   .  Dementia Brother   . Colon cancer Neg Hx   . Esophageal cancer Neg Hx   . Rectal cancer Neg Hx   . Stomach cancer Neg Hx   . Breast cancer Neg Hx   . Prostate cancer Neg Hx    Social History   Socioeconomic History  . Marital status: Married    Spouse name: Not on file  . Number of children: 1  . Years of education: Not on file  . Highest education level: Not on file  Occupational History  . Not on file  Tobacco Use  . Smoking status: Former Smoker    Packs/day: 2.00    Years: 25.00    Pack years: 50.00    Types: Cigarettes    Quit date: 02/18/2004    Years since quitting: 15.6  . Smokeless tobacco: Never Used  . Tobacco comment: per pt stopped in 2006   Vaping Use  . Vaping Use: Former  . Quit date: 02/18/2008  Substance and Sexual Activity  . Alcohol use: Yes    Alcohol/week: 0.0 standard drinks    Comment: 10 beers per day   . Drug use: No  . Sexual activity: Not Currently  Other Topics Concern  . 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    Two Children who both live locally.       He likes to sleep.    Social Determinants of Health   Financial Resource Strain: Low Risk   . Difficulty of Paying Living Expenses: Not hard at all  Food Insecurity: No Food Insecurity  . Worried About Charity fundraiser in the Last Year: Never true  . Ran Out of Food in the Last Year: Never true  Transportation Needs: No Transportation Needs  . Lack of Transportation (Medical): No  . Lack of Transportation (Non-Medical): No  Physical Activity: Sufficiently Active  . Days of Exercise per Week: 7 days  . Minutes of Exercise per Session: 60 min  Stress: No Stress Concern Present  . Feeling of Stress : Not at all  Social Connections: Moderately Isolated  . Frequency of Communication with Friends and Family: More than three times a week  . Frequency of Social Gatherings with Friends and Family: Twice a  week  . Attends Religious Services: Never  . Active Member of Clubs or Organizations: No  . Attends Archivist Meetings: Never  . Marital Status: Married    Tobacco Counseling Counseling given: Not Answered Comment: per pt stopped in 2006    Clinical Intake:  Pre-visit preparation completed: Yes  Pain : No/denies pain     Nutritional Risks: Non-healing wound (Has a wound on arm) Diabetes: No  How often do you need to have someone help you when you read instructions, pamphlets, or other written materials from your doctor or  pharmacy?: 1 - Never What is the last grade level you completed in school?: Bachelors Degree  Diabetic?Yes  Interpreter Needed?: No  Information entered by :: [place] of Daily Living In your present state of health, do you have any difficulty performing the following activities: 10/26/2019  Hearing? N  Vision? N  Difficulty concentrating or making decisions? N  Walking or climbing stairs? N  Dressing or bathing? N  Doing errands, shopping? N  Preparing Food and eating ? N  Using the Toilet? N  In the past six months, have you accidently leaked urine? Y  Comment Has occassional bladder leakage due to prostate cancer  Do you have problems with loss of bowel control? N  Managing your Medications? N  Managing your Finances? N  Housekeeping or managing your Housekeeping? N  Some recent data might be hidden    Patient Care Team: Dorothyann Peng, NP as PCP - General (Family Medicine) [facility], [facility] Cira Rue, RN Nurse Navigator as Registered Nurse (Medical Oncology)  Indicate any recent Medical Services you may have received from other than [facility] providers in the past year (date may be approximate).     Assessment:   This is a routine wellness examination for Javohn.  Hearing/Vision screen  Hearing Screening   125Hz  250Hz  500Hz  1000Hz  2000Hz  3000Hz  4000Hz  6000Hz  8000Hz   Right ear:           Left  ear:           Vision Screening Comments: Patient states gets his eyes checked annually    Dietary issues and exercise activities discussed: Current Exercise Habits: Home exercise routine, Type of exercise: walking, Time (Minutes): 60, Frequency (Times/Week): 7, Weekly Exercise (Minutes/Week): 420, Intensity: Mild, Exercise limited by: orthopedic condition(s) (has knee pain)  Goals    .  Patient Stated      Keep on walking!     .  Patient Stated      I will continue to walk 5 miles per day     .  Patient states (pt-stated)      Maintain current health.       Depression Screen PHQ 2/9 Scores 10/26/2019 05/24/2019 05/24/2019 08/24/2017 01/02/2017 08/18/2016 01/03/2016  PHQ - 2 Score 0 0 0 0 0 0 0  PHQ- 9 Score 0 - - - - - -    Fall Risk Fall Risk  10/26/2019 05/24/2019 01/12/2019 08/24/2017 01/02/2017  Falls in the past year? 0 0 0 No No  Comment - - Emmi Telephone Survey: data to providers prior to load - -  Number falls in past yr: 0 - - - -  Injury with Fall? 0 - - - -  Risk for fall due to : Medication side effect - - - -  Follow up Falls evaluation completed;Falls prevention discussed - - - -    Any stairs in or around the home? Yes  If so, are there any without handrails? No  Home free of loose throw rugs in walkways, pet beds, electrical cords, etc? Yes  Adequate lighting in your home to reduce risk of falls? Yes   ASSISTIVE DEVICES UTILIZED TO PREVENT FALLS:  Life alert? No  Use of a cane, walker or w/c? No  Grab bars in the bathroom? Yes  Shower chair or bench in shower? No  Elevated toilet seat or a handicapped toilet? No     Cognitive Function:     6CIT Screen 10/26/2019  What Year? 0 points  What month? 0 points  What time? 0 points  Count back from 20 0 points  Months in reverse 2 points  Repeat phrase 0 points  Total Score 2    Immunizations Immunization History  Administered Date(s) Administered  . Influenza, High Dose Seasonal PF 10/18/2014, 10/24/2015,  12/05/2017  . Moderna SARS-COVID-2 Vaccination 04/02/2019, 04/30/2019  . Pneumococcal Conjugate-13 10/18/2014  . Pneumococcal Polysaccharide-23 10/24/2015    TDAP status: Due, Education has been provided regarding the importance of this vaccine. Advised may receive this vaccine at local pharmacy or Health Dept. Aware to provide a copy of the vaccination record if obtained from local pharmacy or Health Dept. Verbalized acceptance and understanding. Flu Vaccine status: Up to date Pneumococcal vaccine status: Up to date Covid-19 vaccine status: Completed vaccines  Qualifies for Shingles Vaccine? Yes   Zostavax completed No   Shingrix Completed?: No.    Education has been provided regarding the importance of this vaccine. Patient has been advised to call insurance company to determine out of pocket expense if they have not yet received this vaccine. Advised may also receive vaccine at local pharmacy or Health Dept. Verbalized acceptance and understanding.  Screening Tests Health Maintenance  Topic Date Due  . TETANUS/TDAP  Never done  . OPHTHALMOLOGY EXAM  08/18/2019  . INFLUENZA VACCINE  09/18/2019  . HEMOGLOBIN A1C  11/23/2019  . FOOT EXAM  05/23/2020  . COLONOSCOPY  10/08/2022  . COVID-19 Vaccine  Completed  . Hepatitis C Screening  Completed  . PNA vac Low Risk Adult  Completed    Health Maintenance  Health Maintenance Due  Topic Date Due  . TETANUS/TDAP  Never done  . OPHTHALMOLOGY EXAM  08/18/2019  . INFLUENZA VACCINE  09/18/2019    Colorectal cancer screening: Completed 10/07/2017. Repeat every 5 years  Lung Cancer Screening: (Low Dose CT Chest recommended if Age 54-80 years, 30 pack-year currently smoking OR have quit w/in 15years.) does not qualify.   Lung Cancer Screening Referral: N/A  Additional Screening:  Hepatitis C Screening: does qualify; Completed 03/05/2018  Vision Screening: Recommended annual ophthalmology exams for early detection of glaucoma and  other disorders of the eye. Is the patient up to date with their annual eye exam?  Yes  Who is the provider or what is the name of the office in which the patient attends annual eye exams? [facility] [place] If pt is not established with a provider, would they like to be referred to a provider to establish care? No .   Dental Screening: Recommended annual dental exams for proper oral hygiene  Community Resource Referral / Chronic Care Management: CRR required this visit?  No   CCM required this visit?  No      Plan:     I have personally reviewed and noted the following in the patient's chart:   . Medical and social history . Use of alcohol, tobacco or illicit drugs  . Current medications and supplements . Functional ability and status . Nutritional status . Physical activity . Advanced directives . List of other physicians . Hospitalizations, surgeries, and ER visits in previous 12 months . Vitals . Screenings to include cognitive, depression, and falls . Referrals and appointments  In addition, I have reviewed and discussed with patient certain preventive protocols, quality metrics, and best practice recommendations. A written personalized care plan for preventive services as well as general preventive health recommendations were provided to patient.     Ofilia Neas, LPN   02/26/3788  Nurse Notes: None

## 2019-11-02 ENCOUNTER — Telehealth: Payer: Self-pay | Admitting: Adult Health

## 2019-11-02 NOTE — Progress Notes (Signed)
  Chronic Care Management   Note  11/02/2019 Name: Kymoni Monday MRN: [id number] DOB: [birth year]  Duane Lope Micheal Murad is a 69 y.o. year old male who is a primary care patient of Dorothyann Peng, NP. I reached out to [place] by phone today in response to a referral sent by Mr. Lisette Grinder [name]'s PCP, Dorothyann Peng, NP.   Mr. Korey Prashad was given information about Chronic Care Management services today including:  1. CCM service includes personalized support from designated clinical staff supervised by his physician, including individualized plan of care and coordination with other care providers 2. 24/7 contact phone numbers for assistance for urgent and routine care needs. 3. Service will only be billed when office clinical staff spend 20 minutes or more in a month to coordinate care. 4. Only one practitioner may furnish and bill the service in a calendar month. 5. The patient may stop CCM services at any time (effective at the end of the month) by phone call to the office staff.   Patient agreed to services and verbal consent obtained.   Follow up plan:   Carley Perdue [employer] Scheduler

## 2019-12-07 DIAGNOSIS — Z23 Encounter for immunization: Secondary | ICD-10-CM | POA: Diagnosis not present

## 2019-12-13 DIAGNOSIS — Z23 Encounter for immunization: Secondary | ICD-10-CM | POA: Diagnosis not present

## 2019-12-21 ENCOUNTER — Other Ambulatory Visit: Payer: Self-pay | Admitting: Adult Health

## 2019-12-21 DIAGNOSIS — E119 Type 2 diabetes mellitus without complications: Secondary | ICD-10-CM

## 2019-12-21 DIAGNOSIS — I10 Essential (primary) hypertension: Secondary | ICD-10-CM

## 2019-12-26 ENCOUNTER — Telehealth: Payer: Self-pay | Admitting: Pharmacist

## 2019-12-26 NOTE — Chronic Care Management (AMB) (Signed)
    Chronic Care Management Pharmacy Assistant   Name: James Edwards  MRN: [id number] DOB: 04-16-1950  Reason for Encounter: Medication Review/Initial Questions for Pharmacist visit on 12/27/2019 Patient Questions: 1.  Have you seen any other providers since your last visit? No 2. Any changes in your medications or health? No 3. Any side effects from any medications? No 4. Do you have any symptoms or problems not managed by your medications?  5. Any concerns about your health right now? No 6. Has your provider asked that you check blood pressure, blood sugar, or follow a special diet at home? No 7. Do you get any type of exercise regularly? Yes, he walks every day. On average about 5 miles 8. Can you think of a goal you would like to reach for your health? He likes to stay in good health. 9. Do you have any problems getting your medications? No  10. Is there anything that you would like to discuss during the appointment? No  The patient was asked to please bring medications, blood pressure/ blood sugar log, and supplements to your appointment.  PCP : Dorothyann Peng, NP  Allergies:   Allergies  Allergen Reactions  . Codeine Nausea And Vomiting    Medications: Outpatient Encounter Medications as of 12/26/2019  Medication Sig  . Multiple Vitamin (MULTIVITAMIN) tablet Take 1 tablet by mouth daily.  Marland Kitchen OVER THE COUNTER MEDICATION Gin Raisin for joint health  . oxybutynin (DITROPAN-XL) 10 MG 24 hr tablet Take 10 mg by mouth every evening.   . quinapril (ACCUPRIL) 40 MG tablet TAKE 1 TABLET BY MOUTH EVERY DAY  . traZODone (DESYREL) 50 MG tablet TAKE 1 TABLET (50 MG TOTAL) BY MOUTH AT BEDTIME AS NEEDED.  Marland Kitchen TURMERIC PO Take by mouth.   No facility-administered encounter medications on file as of 12/26/2019.    Current Diagnosis: Patient Active Problem List   Diagnosis Date Noted  . Hyperlipidemia 01/02/2017  . Diabetes mellitus ([place]) 01/03/2016  . HTN (hypertension) 10/18/2014    . Prostate cancer ([place]) 10/18/2014    Goals Addressed   None     Follow-Up:  Pharmacist Review   Maia Breslow, [place] Assistant [phone]

## 2019-12-26 NOTE — Chronic Care Management (AMB) (Signed)
 Chronic Care Management Pharmacy  Name: James Edwards  MRN: [id number] DOB: 07/15/1950  Initial Planning Appointment: completed 12/26/19  Initial Questions: 1. Have you seen any other providers since your last visit? n/a 2. Any changes in your medicines or health? No   Chief Complaint/ HPI  James Edwards,  69 y.o. , male presents for their Initial CCM visit with the clinical pharmacist via telephone due to COVID-19 Pandemic.  PCP : Nafziger, Cory, NP  Their chronic conditions include: HTN, HLD, DM, insomnia, history of prostate cancer  Office Visits: -10/26/19 Shannon Crews, LPN: Patient presented for medicare annual wellness visit.  -06/09/19 Cory Nafziger, NP: Patient presented for evaluation of abscess or cellulitis in mouth. Purulent drainage was removed with pressure. Prescribed Augmentin and short course of Norco for pain relief. Referral placed for ENT.  -05/24/19 Cory Nafziger, NP: Patient presented for chronic conditions follow up. A1C increased but WNL at 5.8%. LDL increased to 125 and prescribed atorvastatin 10 mg daily. CMP and CBC were stable.  Consult Visit: -02/25/19 Lester Borden (urology): Patient presented for follow up for BPH and hx of prostate cancer. Unable to access notes.  Medications: Outpatient Encounter Medications as of 12/27/2019  Medication Sig  . Multiple Vitamin (MULTIVITAMIN) tablet Take 1 tablet by mouth daily.  . OVER THE COUNTER MEDICATION Gin Raisin for joint health  . oxybutynin (DITROPAN-XL) 10 MG 24 hr tablet Take 10 mg by mouth every evening.   . quinapril (ACCUPRIL) 40 MG tablet TAKE 1 TABLET BY MOUTH EVERY DAY  . traZODone (DESYREL) 50 MG tablet TAKE 1 TABLET (50 MG TOTAL) BY MOUTH AT BEDTIME AS NEEDED.  . TURMERIC PO Take by mouth.   No facility-administered encounter medications on file as of 12/27/2019.   Patient lives with his wife in downtown [place] in a 3 story house separated into 3 apartments. His brother lives in the  top apartment and son lives in basement studio. He enjoys walking his dog on a track every morning and walks about 5 miles a day or 10,000 steps.   Patient reports he does the cooking at home and eats out 2-3 times a week. He eats primarily red meat and eats a variety of food such as steak and noodles, hamburgers, vegetables with limas, lasagna, etc.  He eats one dessert at night (chocolate bar or ice cream), doesn't drink soda, and limits his sugar in morning with coffee. He drinks about 7-10 beers every day and has switched from IPA to light beer. He goes out with his friends to the bar many nights.  Patient tries to get about 7 hours of sleep every night but can get away with 6 hours. He does not take naps during the day and feels mostly rested.  He denies any problems with his current medications except oxybutynin. He reports that he has an appointment with urologist and plans to discuss other medications as he feels the oxybutynin is not as effective as it should be.   Current Diagnosis/Assessment:  Goals Addressed            This Visit's Progress   . Pharmacy Care Plan       CARE PLAN ENTRY (see longitudinal plan of care for additional care plan information)  Current Barriers:  . Chronic Disease Management support, education, and care coordination needs related to Hypertension, Hyperlipidemia, Diabetes, and insomnia and prostate cancer history   Hypertension BP Readings from Last 3 Encounters:  06/09/19 140/64  05/24/19 126/80    07/15/18 125/83   . Pharmacist Clinical Goal(s): o Over the next 90 days, patient will work with PharmD and providers to maintain BP goal <140/90 . Current regimen:  o Quinapril 40 mg 1 tablet daily . Interventions: o Discussed DASH eating plan recommendations: . Emphasizes vegetables, fruits, and whole-grains . Includes fat-free or low-fat dairy products, fish, poultry, beans, nuts, and vegetable oils . Limits foods that are high in saturated fat.  These foods include fatty meats, full-fat dairy products, and tropical oils such as coconut, palm kernel, and palm oils. . Limits sugar-sweetened beverages and sweets . Limiting sodium intake to < 1500 mg/day o Discussed recommendations for moderate aerobic exercise for 150 minutes/week spread out over 5 days for heart healthy lifestyle  . Patient self care activities - Over the next 90 days, patient will: o Check blood pressure weekly, document, and provide at future appointments o Ensure daily salt intake < 2300 mg/day  Hyperlipidemia Lab Results  Component Value Date/Time   LDLCALC 125 (H) 05/24/2019 08:19 AM   LDLDIRECT 97.0 01/03/2016 08:21 AM   . Pharmacist Clinical Goal(s): o Over the next 30 days, patient will work with PharmD and providers to achieve LDL goal < 100 . Current regimen:  o No medications . Interventions: o Discussed lowering cholesterol through diet by: . Limiting foods with cholesterol such as liver and other organ meats, egg yolks, shrimp, and whole milk dairy products . Avoiding saturated fats and trans fats and incorporating healthier fats, such as lean meat, nuts, and unsaturated oils like canola and olive oils . Eating foods with soluble fiber such as whole-grain cereals such as oatmeal and oat bran, fruits such as apples, bananas, oranges, pears, and prunes, legumes such as kidney beans, lentils, chick peas, black-eyed peas, and lima beans, and green leafy vegetables . Limiting alcohol intake o Discussed the benefits of cholesterol medications for lowering risk of heart attacks and strokes . Patient self care activities - Over the next 30 days, patient will: o Continue with diet and exercise  Diabetes Lab Results  Component Value Date/Time   HGBA1C 5.8 05/24/2019 08:19 AM   HGBA1C 5.4 07/09/2018 09:44 AM   . Pharmacist Clinical Goal(s): o Over the next 90 days, patient will work with PharmD and providers to maintain A1c goal <7% . Current regimen:   o No medications . Patient self care activities - Over the next 90 days, patient will: o Continue control with diet and exercise  Insomnia . Pharmacist Clinical Goal(s) o Over the next 90 days, patient will work with PharmD and providers to improve quality and quantity of sleep . Current regimen:  o Trazodone 50 mg 1 tablet at bedtime . Interventions: o Discussed practicing good sleep hygiene by setting a sleep schedule and maintaining it, avoid excessive napping, following a nightly routine, avoiding screen time for 30-60 minutes before going to bed, and making the bedroom a cool, quiet and dark space . Patient self care activities - Over the next 90 days, patient will: o Continue current medication  History of prostate cancer . Pharmacist Clinical Goal(s) o Over the next 90 days, patient will work with PharmD and providers to manage symptoms of bladder leakage . Current regimen:  o Oxybutynin ER 10 mg 1 tablet every evening . Patient self care activities - Over the next 90 days, patient will: o Discuss with urologist about switching to an alternative medication for bladder control  Medication management . Pharmacist Clinical Goal(s): o Over the next 90 days,   patient will work with PharmD and providers to maintain optimal medication adherence . Current pharmacy: [facility] . Interventions o Comprehensive medication review performed. o Continue current medication management strategy . Patient self care activities - Over the next 90 days, patient will: o Take medications as prescribed o Report any questions or concerns to PharmD and/or provider(s)  Initial goal documentation       SDOH Interventions     Most Recent Value  SDOH Interventions  Financial Strain Interventions Intervention Not Indicated  Transportation Interventions Intervention Not Indicated      Hypertension  Patient endorses dizziness/lightheadedness especially in the summer.  BP goal is:   <140/90  Office blood pressures are  BP Readings from Last 3 Encounters:  06/09/19 140/64  05/24/19 126/80  07/15/18 125/83   Patient checks BP at home never Patient home BP readings are ranging: n/a  Patient has failed these meds in the past: none Patient is currently controlled on the following medications:  . Quinapril 40 mg 1 tablet daily  We discussed diet and exercise extensively -DASH eating plan recommendations: . Emphasizes vegetables, fruits, and whole-grains . Includes fat-free or low-fat dairy products, fish, poultry, beans, nuts, and vegetable oils . Limits foods that are high in saturated fat. These foods include fatty meats, full-fat dairy products, and tropical oils such as coconut, palm kernel, and palm oils. . Limits sugar-sweetened beverages and sweets . Limiting sodium intake to < 1500 mg/day -Diet: Patient uses some salt when cooking but is conscious of salt content and uses sea salt  Plan  Continue current medications     Diabetes   A1c goal <6.5%  Recent Relevant Labs: Lab Results  Component Value Date/Time   HGBA1C 5.8 05/24/2019 08:19 AM   HGBA1C 5.4 07/09/2018 09:44 AM   GFR 89.20 05/24/2019 08:19 AM   GFR 97.41 03/05/2018 01:17 PM    Last diabetic Eye exam: No results found for: HMDIABEYEEXA  Last diabetic Foot exam: No results found for: HMDIABFOOTEX   Patient has failed these meds in past: none Patient is currently controlled on the following medications: . No medications  We discussed: diet and exercise extensively  Plan  Continue current medications and control with diet and exercise  Hyperlipidemia   LDL goal < 100  Last lipids Lab Results  Component Value Date   CHOL 211 (H) 05/24/2019   HDL 66.30 05/24/2019   LDLCALC 125 (H) 05/24/2019   LDLDIRECT 97.0 01/03/2016   TRIG 97.0 05/24/2019   CHOLHDL 3 05/24/2019   Hepatic Function Latest Ref Rng & Units 05/24/2019 03/05/2018 01/02/2017  Total Protein 6.0 - 8.3 g/dL 6.6  6.9 7.0  Albumin 3.5 - 5.2 g/dL 4.1 4.3 4.2  AST 0 - 37 U/L _0 ALT 0 - 53 U/L _1 Alk Phosphatase 39 - 117 U/L 26(L) 26(L) 30(L)  Total Bilirubin 0.2 - 1.2 mg/dL 0.6 0.7 1.0  Bilirubin, Direct 0.0 - 0.3 mg/dL - - 0.2     The ASCVD Risk score Mikey Bussing DC Jr., et al., 2013) failed to calculate for the following reasons:   The systolic blood pressure is missing   Patient has failed these meds in past: atorvastatin (fatigue/foggy) Patient is currently uncontrolled on the following medications:  . No medications  We discussed:  diet and exercise extensively  -Lowering cholesterol through diet by: Marland Kitchen Limiting foods with cholesterol such as liver and other organ meats, egg yolks, shrimp, and whole milk dairy products . Avoiding saturated fats  and trans fats and incorporating healthier fats, such as lean meat, nuts, and unsaturated oils like canola and olive oils . Eating foods with soluble fiber such as whole-grain cereals such as oatmeal and oat bran, fruits such as apples, bananas, oranges, pears, and prunes, legumes such as kidney beans, lentils, chick peas, black-eyed peas, and lima beans, and green leafy vegetables . Limiting alcohol intake -Discussed recommendations for moderate aerobic exercise for 150 minutes/week spread out over 5 days for heart healthy lifestyle  Plan Will discuss with PCP about starting alternative statin with potential for less side effects.  Continue control with diet and exercise   Insomnia   Patient has failed these meds in past: none Patient is currently controlled on the following medications:  . Trazodone 50 mg 1 tablet at bedtime as needed  We discussed: Practicing good sleep hygiene by setting a sleep schedule and maintaining it, avoid excessive napping, following a nightly routine, avoiding screen time for 30-60 minutes before going to bed, and making the bedroom a cool, quiet and dark space  Plan  Continue current medications   Hx of  prostate cancer   Patient has failed these meds in past:  none Patient is currently uncontrolled on the following medications:  . Oxybutynin ER 10 mg 1 tablet every evening  We discussed:  Patient does not feel that it has been as effective as he would like and plans to discuss switching with urologist  Plan  Continue current medications   OTC/Supplements   Patient is currently on the following medications:  Marland Kitchen Multivitamin 1 tablet daily . Gin raisin 8-9 raisins (anti-inflammatory for thumbs, knees) . Turmeric 400 mg 1 tablet daily (feet pain)  Plan  Continue current medications  Vaccines   Reviewed and discussed patient's vaccination history.    Immunization History  Administered Date(s) Administered  . Influenza, High Dose Seasonal PF 10/18/2014, 10/24/2015, 12/05/2017, 12/07/2019  . Moderna SARS-COVID-2 Vaccination 04/02/2019, 04/30/2019  . Pneumococcal Conjugate-13 10/18/2014  . Pneumococcal Polysaccharide-23 10/24/2015   Patient stated that Shingles was $200.   Patient received Moderna booster - put him down for 1 day.   Plan  Recommended patient receive Shingles and tetanus vaccine in office/at pharmacy.   Medication Management   Pt uses [facility] pharmacy for all medications Uses pill box? No - takes most in morning and then 2 at night Pt endorses 95% compliance - missed 1 in last month  We discussed: Current pharmacy is preferred with insurance plan and patient is satisfied with pharmacy services  Plan  Continue current medication management strategy   Follow up: 3 month phone visit 2 week follow up with [facility] if PCP starts cholesterol medication.    [name], PharmD Clinical Pharmacist [place] [facility] [place]

## 2019-12-27 ENCOUNTER — Other Ambulatory Visit: Payer: Self-pay | Admitting: Adult Health

## 2019-12-27 ENCOUNTER — Ambulatory Visit: Payer: Medicare Other | Admitting: Pharmacist

## 2019-12-27 DIAGNOSIS — E782 Mixed hyperlipidemia: Secondary | ICD-10-CM

## 2019-12-27 DIAGNOSIS — I10 Essential (primary) hypertension: Secondary | ICD-10-CM

## 2019-12-27 MED ORDER — ATORVASTATIN CALCIUM 10 MG PO TABS: 10.0000 mg | ORAL_TABLET | Freq: Every day | ORAL | 3 refills | Status: DC

## 2019-12-29 ENCOUNTER — Other Ambulatory Visit: Payer: Self-pay | Admitting: Adult Health

## 2019-12-29 MED ORDER — PRAVASTATIN SODIUM 20 MG PO TABS: 20.0000 mg | ORAL_TABLET | Freq: Every day | ORAL | 3 refills | Status: DC

## 2020-01-09 NOTE — Patient Instructions (Addendum)
Hi James Edwards,  It was lovely to meet you over the phone! Below is a summary of what we discussed during our call. Please go ahead and start taking pravastatin that James Edwards sent in and we will plan to reach out to you in a few weeks to see how you are doing with it.  Please give me a call if you need anything or have any questions before our follow up in 3 months!  Best, Maddie  [name], PharmD Clinical Pharmacist [facility]   Visit Information  Goals Addressed            This Visit's Progress   . Pharmacy Care Plan       CARE PLAN ENTRY (see longitudinal plan of care for additional care plan information)  Current Barriers:  . Chronic Disease Management support, education, and care coordination needs related to Hypertension, Hyperlipidemia, Diabetes, and insomnia and prostate cancer history   Hypertension BP Readings from Last 3 Encounters:  06/09/19 140/64  05/24/19 126/80  07/15/18 125/83   . Pharmacist Clinical Goal(s): o Over the next 90 days, patient will work with PharmD and providers to maintain BP goal <140/90 . Current regimen:  o Quinapril 40 mg 1 tablet daily . Interventions: o Discussed DASH eating plan recommendations: . Emphasizes vegetables, fruits, and whole-grains . Includes fat-free or low-fat dairy products, fish, poultry, beans, nuts, and vegetable oils . Limits foods that are high in saturated fat. These foods include fatty meats, full-fat dairy products, and tropical oils such as coconut, palm kernel, and palm oils. . Limits sugar-sweetened beverages and sweets . Limiting sodium intake to < 1500 mg/day o Discussed recommendations for moderate aerobic exercise for 150 minutes/week spread out over 5 days for heart healthy lifestyle  . Patient self care activities - Over the next 90 days, patient will: o Check blood pressure weekly, document, and provide at future appointments o Ensure daily salt intake < 2300  mg/day  Hyperlipidemia Lab Results  Component Value Date/Time   LDLCALC 125 (H) 05/24/2019 08:19 AM   LDLDIRECT 97.0 01/03/2016 08:21 AM   . Pharmacist Clinical Goal(s): o Over the next 30 days, patient will work with PharmD and providers to achieve LDL goal < 100 . Current regimen:  o No medications . Interventions: o Discussed lowering cholesterol through diet by: Marland Kitchen Limiting foods with cholesterol such as liver and other organ meats, egg yolks, shrimp, and whole milk dairy products . Avoiding saturated fats and trans fats and incorporating healthier fats, such as lean meat, nuts, and unsaturated oils like canola and olive oils . Eating foods with soluble fiber such as whole-grain cereals such as oatmeal and oat bran, fruits such as apples, bananas, oranges, pears, and prunes, legumes such as kidney beans, lentils, chick peas, black-eyed peas, and lima beans, and green leafy vegetables . Limiting alcohol intake o Discussed the benefits of cholesterol medications for lowering risk of heart attacks and strokes . Patient self care activities - Over the next 30 days, patient will: o Start taking pravastatin daily  Diabetes Lab Results  Component Value Date/Time   HGBA1C 5.8 05/24/2019 08:19 AM   HGBA1C 5.4 07/09/2018 09:44 AM   . Pharmacist Clinical Goal(s): o Over the next 90 days, patient will work with PharmD and providers to maintain A1c goal <7% . Current regimen:  o No medications . Patient self care activities - Over the next 90 days, patient will: o Continue control with diet and exercise  Insomnia .  Pharmacist Clinical Goal(s) o Over the next 90 days, patient will work with PharmD and providers to improve quality and quantity of sleep . Current regimen:  o Trazodone 50 mg 1 tablet at bedtime . Interventions: o Discussed practicing good sleep hygiene by setting a sleep schedule and maintaining it, avoid excessive napping, following a nightly routine, avoiding screen time  for 30-60 minutes before going to bed, and making the bedroom a cool, quiet and dark space . Patient self care activities - Over the next 90 days, patient will: o Continue current medication  History of prostate cancer . Pharmacist Clinical Goal(s) o Over the next 90 days, patient will work with PharmD and providers to manage symptoms of bladder leakage . Current regimen:  o Oxybutynin ER 10 mg 1 tablet every evening . Patient self care activities - Over the next 90 days, patient will: o Discuss with urologist about switching to an alternative medication for bladder control  Medication management . Pharmacist Clinical Goal(s): o Over the next 90 days, patient will work with PharmD and providers to maintain optimal medication adherence . Current pharmacy: [facility] . Interventions o Comprehensive medication review performed. o Continue current medication management strategy . Patient self care activities - Over the next 90 days, patient will: o Take medications as prescribed o Report any questions or concerns to PharmD and/or provider(s)  Initial goal documentation        James Edwards was given information about Chronic Care Management services today including:  1. CCM service includes personalized support from designated clinical staff supervised by his physician, including individualized plan of care and coordination with other care providers 2. 24/7 contact phone numbers for assistance for urgent and routine care needs. 3. Standard insurance, coinsurance, copays and deductibles apply for chronic care management only during months in which we provide at least 20 minutes of these services. Most insurances cover these services at 100%, however patients may be responsible for any copay, coinsurance and/or deductible if applicable. This service may help you avoid the need for more expensive face-to-face services. 4. Only one practitioner may furnish and bill the service in a calendar  month. 5. The patient may stop CCM services at any time (effective at the end of the month) by phone call to the office staff.  Patient agreed to services and verbal consent obtained.   The patient verbalized understanding of instructions, educational materials, and care plan provided today and agreed to receive a mailed copy of patient instructions, educational materials, and care plan.  Telephone follow up appointment with pharmacy team member scheduled for:    Cholesterol Content in Foods Cholesterol is a waxy, fat-like substance that helps to carry fat in the blood. The body needs cholesterol in small amounts, but too much cholesterol can cause damage to the arteries and heart. Most people should eat less than 200 milligrams (mg) of cholesterol a day. Foods with cholesterol  Cholesterol is found in animal-based foods, such as meat, seafood, and dairy. Generally, low-fat dairy and lean meats have less cholesterol than full-fat dairy and fatty meats. The milligrams of cholesterol per serving (mg per serving) of common cholesterol-containing foods are listed below. Meat and other proteins  Egg -- one large whole egg has 186 mg.  Veal shank -- 4 oz has 141 mg.  Lean ground [place] (93% lean) -- 4 oz has 118 mg.  Fat-trimmed lamb loin -- 4 oz has 106 mg.  Lean ground beef (90% lean) -- 4 oz has 100  mg.  Lobster -- 3.5 oz has 90 mg.  Pork loin chops -- 4 oz has 86 mg.  Canned salmon -- 3.5 oz has 83 mg.  Fat-trimmed beef top loin -- 4 oz has 78 mg.  Frankfurter -- 1 frank (3.5 oz) has 77 mg.  Crab -- 3.5 oz has 71 mg.  Roasted chicken without skin, white meat -- 4 oz has 66 mg.  Light bologna -- 2 oz has 45 mg.  Deli-cut [place] -- 2 oz has 31 mg.  Canned tuna -- 3.5 oz has 31 mg.  Berniece Salines -- 1 oz has 29 mg.  Oysters and mussels (raw) -- 3.5 oz has 25 mg.  Mackerel -- 1 oz has 22 mg.  Trout -- 1 oz has 20 mg.  Pork sausage -- 1 link (1 oz) has 17 mg.  Salmon -- 1 oz  has 16 mg.  Tilapia -- 1 oz has 14 mg. Dairy  Soft-serve ice cream --  cup (4 oz) has 103 mg.  Whole-milk yogurt -- 1 cup (8 oz) has 29 mg.  Cheddar cheese -- 1 oz has 28 mg.  American cheese -- 1 oz has 28 mg.  Whole milk -- 1 cup (8 oz) has 23 mg.  2% milk -- 1 cup (8 oz) has 18 mg.  Cream cheese -- 1 tablespoon (Tbsp) has 15 mg.  Cottage cheese --  cup (4 oz) has 14 mg.  Low-fat (1%) milk -- 1 cup (8 oz) has 10 mg.  Sour cream -- 1 Tbsp has 8.5 mg.  Low-fat yogurt -- 1 cup (8 oz) has 8 mg.  Nonfat Greek yogurt -- 1 cup (8 oz) has 7 mg.  Half-and-half cream -- 1 Tbsp has 5 mg. Fats and oils  Cod liver oil -- 1 tablespoon (Tbsp) has 82 mg.  Butter -- 1 Tbsp has 15 mg.  Lard -- 1 Tbsp has 14 mg.  Bacon grease -- 1 Tbsp has 14 mg.  Mayonnaise -- 1 Tbsp has 5-10 mg.  Margarine -- 1 Tbsp has 3-10 mg. Exact amounts of cholesterol in these foods may vary depending on specific ingredients and brands. Foods without cholesterol Most plant-based foods do not have cholesterol unless you combine them with a food that has cholesterol. Foods without cholesterol include:  Grains and cereals.  Vegetables.  Fruits.  Vegetable oils, such as olive, canola, and sunflower oil.  Legumes, such as peas, beans, and lentils.  Nuts and seeds.  Egg whites. Summary  The body needs cholesterol in small amounts, but too much cholesterol can cause damage to the arteries and heart.  Most people should eat less than 200 milligrams (mg) of cholesterol a day. This information is not intended to replace advice given to you by your health care provider. Make sure you discuss any questions you have with your health care provider. Document Revised: 01/16/2017 Document Reviewed: 09/30/2016 Elsevier Patient Education  [place].

## 2020-01-30 ENCOUNTER — Telehealth: Payer: Self-pay | Admitting: Pharmacist

## 2020-02-03 NOTE — Chronic Care Management (AMB) (Signed)
° ° °  Chronic Care Management Pharmacy Assistant   Name: Estes Lehner  MRN: [id number] DOB: [birth year]  Reason for Encounter: Disease State  PCP : Dorothyann Peng, NP  Allergies:   Allergies  Allergen Reactions   Codeine Nausea And Vomiting    Medications: Outpatient Encounter Medications as of 01/30/2020  Medication Sig   Multiple Vitamin (MULTIVITAMIN) tablet Take 1 tablet by mouth daily.   OVER THE COUNTER MEDICATION Gin Raisin for joint health   oxybutynin (DITROPAN-XL) 10 MG 24 hr tablet Take 10 mg by mouth every evening.    pravastatin (PRAVACHOL) 20 MG tablet Take 1 tablet (20 mg total) by mouth daily.   quinapril (ACCUPRIL) 40 MG tablet TAKE 1 TABLET BY MOUTH EVERY DAY   traZODone (DESYREL) 50 MG tablet TAKE 1 TABLET (50 MG TOTAL) BY MOUTH AT BEDTIME AS NEEDED.   TURMERIC PO Take by mouth.   No facility-administered encounter medications on file as of 01/30/2020.    Current Diagnosis: Patient Active Problem List   Diagnosis Date Noted   Hyperlipidemia 01/02/2017   Diabetes mellitus ([place]) 01/03/2016   HTN (hypertension) 10/18/2014   Prostate cancer ([place]) 10/18/2014    Goals Addressed   None     Follow-Up:  Pharmacist Review   01-30-2020- I left a voicemail for the patient to call back.  02-02-2020-I spoke with the patient and discussed medication adherence with the patient. Question the patient on the effects of the changes from his medications from his last appointment. He stated that he did not pick up the medication because the pharmacy did not have a copy of the prescription. He did not call the office back to see if it was another change. Pharmacist Watt Climes, [place] made aware patient did not pick this medication up.I spoke with the patients pharmacy and his RX was on hold. I called the patient back and left a message to contact his pharmacy.    Maia Breslow, [place] Assistant [phone]

## 2020-02-04 ENCOUNTER — Other Ambulatory Visit: Payer: Self-pay | Admitting: Adult Health

## 2020-02-04 DIAGNOSIS — G4700 Insomnia, unspecified: Secondary | ICD-10-CM

## 2020-03-16 DIAGNOSIS — C61 Malignant neoplasm of prostate: Secondary | ICD-10-CM | POA: Diagnosis not present

## 2020-03-20 DIAGNOSIS — I4819 Other persistent atrial fibrillation: Secondary | ICD-10-CM

## 2020-03-20 HISTORY — DX: Other persistent atrial fibrillation: I48.19

## 2020-03-23 DIAGNOSIS — C61 Malignant neoplasm of prostate: Secondary | ICD-10-CM | POA: Diagnosis not present

## 2020-03-23 DIAGNOSIS — R3915 Urgency of urination: Secondary | ICD-10-CM | POA: Diagnosis not present

## 2020-03-23 DIAGNOSIS — N401 Enlarged prostate with lower urinary tract symptoms: Secondary | ICD-10-CM | POA: Diagnosis not present

## 2020-03-27 ENCOUNTER — Other Ambulatory Visit: Payer: Self-pay

## 2020-03-27 ENCOUNTER — Encounter: Payer: Self-pay | Admitting: Adult Health

## 2020-03-27 ENCOUNTER — Ambulatory Visit (INDEPENDENT_AMBULATORY_CARE_PROVIDER_SITE_OTHER): Payer: Medicare Other | Admitting: Adult Health

## 2020-03-27 VITALS — BP 120/68 | Temp 98.7°F | Wt 198.0 lb

## 2020-03-27 DIAGNOSIS — R5383 Other fatigue: Secondary | ICD-10-CM | POA: Diagnosis not present

## 2020-03-27 DIAGNOSIS — E782 Mixed hyperlipidemia: Secondary | ICD-10-CM | POA: Diagnosis not present

## 2020-03-27 DIAGNOSIS — I4819 Other persistent atrial fibrillation: Secondary | ICD-10-CM | POA: Diagnosis not present

## 2020-03-27 DIAGNOSIS — I10 Essential (primary) hypertension: Secondary | ICD-10-CM | POA: Diagnosis not present

## 2020-03-27 LAB — BASIC METABOLIC PANEL
BUN: 17 mg/dL (ref 6–23)
CO2: 28 mEq/L (ref 19–32)
Calcium: 10.3 mg/dL (ref 8.4–10.5)
Chloride: 104 mEq/L (ref 96–112)
Creatinine, Ser: 1.03 mg/dL (ref 0.40–1.50)
GFR: 73.59 mL/min (ref >60.00)
Glucose, Bld: 148 mg/dL — ABNORMAL HIGH (ref 70–99)
Potassium: 4.7 mEq/L (ref 3.5–5.1)
Sodium: 137 mEq/L (ref 135–145)

## 2020-03-27 LAB — TSH: TSH: 1.03 u[IU]/mL (ref 0.35–4.50)

## 2020-03-27 MED ORDER — PRAVASTATIN SODIUM 20 MG PO TABS: 20.0000 mg | ORAL_TABLET | Freq: Every day | ORAL | 3 refills | Status: DC

## 2020-03-27 MED ORDER — METOPROLOL TARTRATE 25 MG PO TABS: 25.0000 mg | ORAL_TABLET | Freq: Two times a day (BID) | ORAL | 3 refills | Status: DC

## 2020-03-27 MED ORDER — RIVAROXABAN 20 MG PO TABS: 20.0000 mg | ORAL_TABLET | Freq: Every day | ORAL | 0 refills | Status: DC

## 2020-03-27 NOTE — Progress Notes (Signed)
Subjective:    Patient ID: James Edwards, male    DOB: 10/16/1950, 70 y.o.   MRN: [id number]  HPI  70 year old male who  has a past medical history of Allergy, Arthritis, Diabetes mellitus without complication ([place]) (dx 87-56-43), GERD (gastroesophageal reflux disease), Hypertension, and Prostate cancer ([place]) (2013).  He presents to the office today for concern of new onset Afib. He reports that his Apple watch first alerted him of an arrhythmia on December 13th. He did not have any other alerts until earlier this week when he has been alerted every day over the last six days. He denies chest pain or shortness of breath. Does feel " a little fatigued"   Does snore but no apneic episodes.   Review of Systems See HPI   Past Medical History:  Diagnosis Date  . Allergy   . Arthritis   . Diabetes mellitus without complication ([place]) dx 32-95-18   type 2 diet controlled   . GERD (gastroesophageal reflux disease)   . Hypertension   . Prostate cancer [place]) 2013   prostate    Social History   Socioeconomic History  . Marital status: Married    Spouse name: Not on file  . Number of children: 1  . Years of education: Not on file  . Highest education level: Not on file  Occupational History  . Not on file  Tobacco Use  . Smoking status: Former Smoker    Packs/day: 2.00    Years: 25.00    Pack years: 50.00    Types: Cigarettes    Quit date: 02/18/2004    Years since quitting: 16.1  . Smokeless tobacco: Never Used  . Tobacco comment: per pt stopped in 2006   Vaping Use  . Vaping Use: Former  . Quit date: 02/18/2008  Substance and Sexual Activity  . Alcohol use: Yes    Alcohol/week: 0.0 standard drinks    Comment: 10 beers per day   . Drug use: No  . Sexual activity: Not Currently  Other Topics Concern  . 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    Two Children who both live locally.       He  likes to sleep.    Social Determinants of Health   Financial Resource Strain: Low Risk   . Difficulty of Paying Living Expenses: Not hard at all  Food Insecurity: No Food Insecurity  . Worried About Charity fundraiser in the Last Year: Never true  . Ran Out of Food in the Last Year: Never true  Transportation Needs: No Transportation Needs  . Lack of Transportation (Medical): No  . Lack of Transportation (Non-Medical): No  Physical Activity: Sufficiently Active  . Days of Exercise per Week: 7 days  . Minutes of Exercise per Session: 60 min  Stress: No Stress Concern Present  . Feeling of Stress : Not at all  Social Connections: Moderately Isolated  . Frequency of Communication with Friends and Family: More than three times a week  . Frequency of Social Gatherings with Friends and Family: Twice a week  . Attends Religious Services: Never  . Active Member of Clubs or Organizations: No  . Attends Archivist Meetings: Never  . Marital Status: Married  Human resources officer Violence: Not At Risk  . Fear of Current or Ex-Partner: No  . Emotionally Abused: No  .  Physically Abused: No  . Sexually Abused: No    Past Surgical History:  Procedure Laterality Date  . COLONOSCOPY    . colonscopy     x 2  . ELBOW SURGERY Right 2007  . POLYPECTOMY    . PROSTATE BIOPSY     x 3  . PROSTATE BIOPSY N/A 01/21/2016   Procedure: BIOPSY TRANSRECTAL ULTRASONIC PROSTATE (TUBP);  Surgeon: Raynelle Bring, MD;  Location: [facility];  Service: Urology;  Laterality: N/A;  . PROSTATE BIOPSY N/A 07/15/2018   Procedure: NEEDLE BIOPSY TRANSRECTAL ULTRASONIC PROSTATE (TUBP);  Surgeon: Raynelle Bring, MD;  Location: [facility];  Service: Urology;  Laterality: N/A;    Family History  Problem Relation Age of Onset  . Arthritis Father   . Heart disease Father   . Hypertension Father   . Bladder Cancer Father   . Colon polyps Father   . Cancer Father        bladder  . Heart disease Mother   . Hypertension  Mother   . Bladder Cancer Mother   . Glaucoma Mother   . Dementia Brother   . Colon cancer Neg Hx   . Esophageal cancer Neg Hx   . Rectal cancer Neg Hx   . Stomach cancer Neg Hx   . Breast cancer Neg Hx   . Prostate cancer Neg Hx     Allergies  Allergen Reactions  . Codeine Nausea And Vomiting    Current Outpatient Medications on File Prior to Visit  Medication Sig Dispense Refill  . Multiple Vitamin (MULTIVITAMIN) tablet Take 1 tablet by mouth daily.    Marland Kitchen OVER THE COUNTER MEDICATION Gin Raisin for joint health    . oxybutynin (DITROPAN-XL) 10 MG 24 hr tablet Take 10 mg by mouth every evening.     . traZODone (DESYREL) 50 MG tablet TAKE 1 TABLET (50 MG TOTAL) BY MOUTH AT BEDTIME AS NEEDED. 90 tablet 1  . TURMERIC PO Take by mouth.     No current facility-administered medications on file prior to visit.    BP 120/68   Temp 98.7 F (37.1 C)   Wt 198 lb (89.8 kg)   BMI 29.24 kg/m       Objective:   Physical Exam Vitals reviewed.  Constitutional:      Appearance: Normal appearance.  Cardiovascular:     Rate and Rhythm: Normal rate. Rhythm irregularly irregular.     Pulses: Normal pulses.     Heart sounds: Normal heart sounds.  Pulmonary:     Effort: Pulmonary effort is normal.     Breath sounds: Normal breath sounds.  Musculoskeletal:     Right lower leg: No edema.     Left lower leg: No edema.  Skin:    General: Skin is warm and dry.     Capillary Refill: Capillary refill takes less than 2 seconds.  Neurological:     General: No focal deficit present.     Mental Status: He is alert.  Psychiatric:        Mood and Affect: Mood normal.        Behavior: Behavior normal.        Thought Content: Thought content normal.        Judgment: Judgment normal.       Assessment & Plan:  1. Persistent atrial fibrillation (HCC) - EKG 12-Lead- Afib with rate of 94.  - Start on Xarelto today  - Start on Metoprolol 25 mg BID for rate control.  - Send  to Afib clinic  -  No signs or symptoms of DVT/PE - rivaroxaban (XARELTO) 20 MG TABS tablet; Take 1 tablet (20 mg total) by mouth daily with supper.  Dispense: 30 tablet; Refill: 0 - Basic Metabolic Panel; Future - metoprolol tartrate (LOPRESSOR) 25 MG tablet; Take 1 tablet (25 mg total) by mouth 2 (two) times daily.  Dispense: 180 tablet; Refill: 3 - Amb Referral to AFIB Clinic - Ambulatory referral to Pulmonology - TSH; Future - TSH - Basic Metabolic Panel  2. Primary hypertension - Will have him stop Accupril and start Metoprolol 25 mg BID   3. Mixed hyperlipidemia  - pravastatin (PRAVACHOL) 20 MG tablet; Take 1 tablet (20 mg total) by mouth daily.  Dispense: 90 tablet; Refill: 3  Dorothyann Peng, NP

## 2020-03-27 NOTE — Patient Instructions (Signed)
The EKG did show a fib   I am going to start you on Xarelto - start this when you get it and then tomorrow start at supper  Stop Accupril and start Metoprolol   Someone from the afib clinic will call you to schedule your appointment

## 2020-03-28 ENCOUNTER — Telehealth: Payer: Self-pay | Admitting: Adult Health

## 2020-03-28 DIAGNOSIS — I4819 Other persistent atrial fibrillation: Secondary | ICD-10-CM

## 2020-03-28 MED ORDER — RIVAROXABAN 20 MG PO TABS: 20.0000 mg | ORAL_TABLET | Freq: Every day | ORAL | 0 refills | Status: DC

## 2020-03-28 NOTE — Telephone Encounter (Signed)
Pt is calling back with a fax number to [facility] [phone].  Pt stated that he contacted [facility] and they will give his Rx Xarelto for $86.00 but the program will not start until May 18, 2020 verses the $520.00.

## 2020-03-28 NOTE — Telephone Encounter (Signed)
Pt call and stated the [place] cost him 520.00 he stated [facility] drug plan will give him generic it is call Rivaroxaban but need Tommi Rumps to sent them a  RX ,will call back with a fax #.

## 2020-03-28 NOTE — Telephone Encounter (Signed)
Rx printed for Tommi Rumps to sign

## 2020-03-28 NOTE — Telephone Encounter (Signed)
Rx signed by Tommi Rumps and faxed.  Received confirmation the transaction was successful.  Nothing further needed.

## 2020-04-02 ENCOUNTER — Ambulatory Visit (INDEPENDENT_AMBULATORY_CARE_PROVIDER_SITE_OTHER): Payer: Medicare Other | Admitting: Pharmacist

## 2020-04-02 DIAGNOSIS — I4819 Other persistent atrial fibrillation: Secondary | ICD-10-CM

## 2020-04-02 DIAGNOSIS — I10 Essential (primary) hypertension: Secondary | ICD-10-CM | POA: Diagnosis not present

## 2020-04-02 DIAGNOSIS — E782 Mixed hyperlipidemia: Secondary | ICD-10-CM

## 2020-04-02 NOTE — Progress Notes (Signed)
Chronic Care Management Pharmacy Note  04/11/2020 Name:  James Edwards MRN:  [id number] DOB:  10/08/1950  Subjective: James Edwards is an 70 y.o. year old male who is a primary patient of Dorothyann Peng, NP.  The CCM team was consulted for assistance with disease management and care coordination needs.    Engaged with patient by telephone for follow up visit in response to provider referral for pharmacy case management and/or care coordination services.   Consent to Services:  The patient was given information about Chronic Care Management services, agreed to services, and gave verbal consent prior to initiation of services.  Please see initial visit note for detailed documentation.   Patient Care Team: Dorothyann Peng, NP as PCP - General (Family Medicine) Pa, [facility] Cira Rue, RN Nurse Navigator as Registered Nurse (Medical Oncology) Viona Gilmore, [facility] as Pharmacist (Pharmacist)  Recent office visits: 03/27/20 Dorothyann Peng, NP: Patient presented for new onset Afib. EKG confirmed. Prescribed metoprolol tartrate 25 mg BID and Xarelto 20 mg daily with supper and d/c'd quinapril.  10/26/19 Ofilia Neas, LPN: Patient presented for medicare annual wellness visit.  06/09/19 Dorothyann Peng, NP: Patient presented for evaluation of abscess or cellulitis in mouth. Purulent drainage was removed with pressure. Prescribed Augmentin and short course of Norco for pain relief. Referral placed for ENT.  05/24/19 Dorothyann Peng, NP: Patient presented for chronic conditions follow up. A1C increased but WNL at 5.8%. LDL increased to 125 and prescribed atorvastatin 10 mg daily. CMP and CBC were stable.  Recent consult visits: 02/25/19 Raynelle Bring (urology): Patient presented for follow up for BPH and hx of prostate cancer. Unable to access notes.  Hospital visits: None in previous 6 months  Objective:  Lab Results  Component Value Date   CREATININE 1.03 03/27/2020    BUN 17 03/27/2020   GFR 73.59 03/27/2020   GFRNONAA >60 07/09/2018   GFRAA >60 07/09/2018   NA 137 03/27/2020   K 4.7 03/27/2020   CALCIUM 10.3 03/27/2020   CO2 28 03/27/2020    Lab Results  Component Value Date/Time   HGBA1C 5.8 05/24/2019 08:19 AM   HGBA1C 5.4 07/09/2018 09:44 AM   GFR 73.59 03/27/2020 02:37 PM   GFR 89.20 05/24/2019 08:19 AM    Last diabetic Eye exam: No results found for: HMDIABEYEEXA  Last diabetic Foot exam: No results found for: HMDIABFOOTEX   Lab Results  Component Value Date   CHOL 211 (H) 05/24/2019   HDL 66.30 05/24/2019   LDLCALC 125 (H) 05/24/2019   LDLDIRECT 97.0 01/03/2016   TRIG 97.0 05/24/2019   CHOLHDL 3 05/24/2019    Hepatic Function Latest Ref Rng & Units 05/24/2019 03/05/2018 01/02/2017  Total Protein 6.0 - 8.3 g/dL 6.6 6.9 7.0  Albumin 3.5 - 5.2 g/dL 4.1 4.3 4.2  AST 0 - 37 U/L 17 17 21   ALT 0 - 53 U/L 14 13 18   Alk Phosphatase 39 - 117 U/L 26(L) 26(L) 30(L)  Total Bilirubin 0.2 - 1.2 mg/dL 0.6 0.7 1.0  Bilirubin, Direct 0.0 - 0.3 mg/dL - - 0.2    Lab Results  Component Value Date/Time   TSH 1.03 03/27/2020 02:37 PM   TSH 1.12 05/24/2019 08:19 AM    CBC Latest Ref Rng & Units 05/24/2019 07/09/2018 03/05/2018  WBC 4.0 - 10.5 K/uL 6.7 7.6 6.9  Hemoglobin 13.0 - 17.0 g/dL 13.7 14.3 14.3  Hematocrit 39.0 - 52.0 % 40.2 42.6 40.8  Platelets 150.0 - 400.0 K/uL  238.0 255 286.0    No results found for: VD25OH  Clinical ASCVD: No  The 10-year ASCVD risk score Mikey Bussing DC Jr., et al., 2013) is: 41.8%   Values used to calculate the score:     Age: 26 years     Sex: Male     Is Non-Hispanic African American: No     Diabetic: Yes     Tobacco smoker: No     Systolic Blood Pressure: 182 mmHg     Is BP treated: Yes     HDL Cholesterol: 66.3 mg/dL     Total Cholesterol: 211 mg/dL    Depression screen [facility] 2/9 10/26/2019 05/24/2019 05/24/2019  Decreased Interest 0 0 0  Down, Depressed, Hopeless 0 0 0  PHQ - 2 Score 0 0 0  Altered sleeping 0 -  -  Tired, decreased energy 0 - -  Change in appetite 0 - -  Feeling bad or failure about yourself  0 - -  Trouble concentrating 0 - -  Moving slowly or fidgety/restless 0 - -  Suicidal thoughts 0 - -  PHQ-9 Score 0 - -  Difficult doing work/chores Not difficult at all - -    CHA2DS2/VAS Stroke Risk Points  Current as of 3 days ago     3 >= 2 Points: High Risk  1 - 1.99 Points: Medium Risk  0 Points: Low Risk    No Change      Details    This score determines the patient's risk of having a stroke if the  patient has atrial fibrillation.       Points Metrics  0 Has Congestive Heart Failure:  No    Current as of 3 days ago  0 Has Vascular Disease:  No    Current as of 3 days ago  1 Has Hypertension:  Yes    Current as of 3 days ago  1 Age:  44    Current as of 3 days ago  1 Has Diabetes:  Yes    Current as of 3 days ago  0 Had Stroke:  No  Had TIA:  No  Had Thromboembolism:  No    Current as of 3 days ago  0 Male:  No    Current as of 3 days ago      Social History   Tobacco Use  Smoking Status Former Smoker  . Packs/day: 2.00  . Years: 25.00  . Pack years: 50.00  . Types: Cigarettes  . Quit date: 02/18/2004  . Years since quitting: 16.1  Smokeless Tobacco Never Used  Tobacco Comment   per pt stopped in 2006    BP Readings from Last 3 Encounters:  04/06/20 (!) 150/70  03/27/20 120/68  06/09/19 140/64   Pulse Readings from Last 3 Encounters:  04/06/20 (!) 48  06/09/19 (!) 57  07/15/18 62   Wt Readings from Last 3 Encounters:  04/06/20 192 lb 12.8 oz (87.5 kg)  03/27/20 198 lb (89.8 kg)  06/09/19 199 lb 9.6 oz (90.5 kg)    Assessment/Interventions: Review of patient past medical history, allergies, medications, health status, including review of consultants reports, laboratory and other test data, was performed as part of comprehensive evaluation and provision of chronic care management services.   SDOH:  (Social Determinants of Health) assessments  and interventions performed: Yes   CCM Care Plan  Allergies  Allergen Reactions  . Codeine Nausea And Vomiting    Medications Reviewed Today    Reviewed  by James Kehr, [place] (Certified Medical Assistant) on 04/06/20 at 1038  Med List Status: <None>  Medication Order Taking? Sig Documenting Provider Last Dose Status Informant  metoprolol tartrate (LOPRESSOR) 25 MG tablet [id number] Yes Take 1 tablet (25 mg total) by mouth 2 (two) times daily. Nafziger, Tommi Rumps, NP Taking Active   Multiple Vitamin (MULTIVITAMIN) tablet [id number] Yes Take 1 tablet by mouth daily. [provider] Taking Active   OVER THE [place] [id number] Yes Vernell Leep for joint health [provider] Taking Active   oxybutynin (DITROPAN-XL) 10 MG 24 hr tablet [id number] Yes Take 10 mg by mouth every evening.  [provider] Taking Active Self  pravastatin (PRAVACHOL) 20 MG tablet [id number] Yes Take 1 tablet (20 mg total) by mouth daily. Nafziger, Tommi Rumps, NP Taking Active   rivaroxaban (XARELTO) 20 MG TABS tablet [id number] Yes Take 1 tablet (20 mg total) by mouth daily with supper. Nafziger, Tommi Rumps, NP Taking Active   traZODone (DESYREL) 50 MG tablet [id number] Yes TAKE 1 TABLET (50 MG TOTAL) BY MOUTH AT BEDTIME AS NEEDED. Dorothyann Peng, NP Taking Active   TURMERIC PO [id number] Yes Take by mouth. [provider] Taking Active           Patient Active Problem List   Diagnosis Date Noted  . Persistent atrial fibrillation ([place]) 04/06/2020  . Secondary hypercoagulable state ([place]) 04/06/2020  . Hyperlipidemia 01/02/2017  . Diabetes mellitus ([place]) 01/03/2016  . HTN (hypertension) 10/18/2014  . Prostate cancer ([place]) 10/18/2014    Immunization History  Administered Date(s) Administered  . Influenza, High Dose Seasonal PF 10/18/2014, 10/24/2015, 12/05/2017, 12/07/2019  . Moderna SARS-COV2 Booster Vaccination 12/13/2019  . Moderna Sars-Covid-2 Vaccination 04/02/2019, 04/30/2019   . Pneumococcal Conjugate-13 10/18/2014  . Pneumococcal Polysaccharide-23 10/24/2015    Conditions to be addressed/monitored:  Hypertension, Hyperlipidemia, Diabetes, Atrial Fibrillation and insomnia and history of prostate cancer  Care Plan : [place]  Updates made by Viona Gilmore, [place] since 04/11/2020 12:00 AM    Problem: Problem: Hypertension, Hyperlipidemia, Diabetes, Atrial Fibrillation and insomnia and history of prostate cancer     Long-Range Goal: Patient-Specific Goal   Start Date: 04/02/2020  Expected End Date: 04/02/2021  This Visit's Progress: On track  Priority: High  Note:   Current Barriers:  . Unable to independently monitor therapeutic efficacy . Unable to achieve control of cholesterol   Pharmacist Clinical Goal(s):  Marland Kitchen Over the next 120 days, patient will verbalize ability to afford treatment regimen . achieve control of cholesterol as evidenced by lipid panel  through collaboration with PharmD and provider.   Interventions: . 1:1 collaboration with Dorothyann Peng, NP regarding development and update of comprehensive plan of care as evidenced by provider attestation and co-signature . Inter-disciplinary care team collaboration (see longitudinal plan of care) . Comprehensive medication review performed; medication list updated in electronic medical record  Hypertension (BP goal <140/90) -Controlled -Current treatment: . Metoprolol tartrate 25 mg 1 tablet twice daily -Medications previously tried: quinapril (switched for Afib) -Current home readings: 141/93 (usually 139/70) -Current dietary habits: Patient uses some salt when cooking but is conscious of salt content and uses sea salt -Current exercise habits: 2 mile walk each morning -Denies hypotensive/hypertensive symptoms -Educated on Exercise goal of 150 minutes per week; Importance of home blood pressure monitoring; -Counseled to monitor BP at home a couple times a week, document, and  provide log at future appointments -Recommended to continue current medication  Hyperlipidemia: (LDL goal < 100) -Uncontrolled -Current  treatment: . Pravastatin 20 mg 1 tablet daily  -Medications previously tried: atorvastatin (fatigue/foggy) -Current dietary patterns: drinks about 7-10 beers every day and has switched from IPA to light beer -Current exercise habits: walking 2 miles a day -Educated on Cholesterol goals;  Benefits of statin for ASCVD risk reduction; Importance of limiting foods high in cholesterol; Exercise goal of 150 minutes per week; -Recommended to continue current medication  Atrial Fibrillation (Goal: prevent stroke and major bleeding) -Controlled -CHADSVASC: 3 -Current treatment: . Rate control: metoprolol tartrate 12.5 mg 1 tablet twice daily . Anticoagulation: Xarelto 20 mg 1 tablet with dinner -Medications previously tried: none -Home BP and HR readings: unable to provide  -Counseled on bleeding risk associated with Xarelto and importance of self-monitoring for signs/symptoms of bleeding; avoidance of NSAIDs due to increased bleeding risk with anticoagulants; -Recommended to continue current medication Assessed patient finances. Both Xarelto and Eliquis are the same under his insurance and he has a high deductible to pay which is why his copay will be high for 2 months.  Insomnia (Goal: improve quality and quantity of sleep) -Controlled -Current treatment  . Trazodone 50 mg 1 tablet at bedtime as needed -Medications previously tried: none  -Recommended to continue current medication Counseled on practicing good sleep hygiene by setting a sleep schedule and maintaining it, avoid excessive napping, following a nightly routine, avoiding screen time for 30-60 minutes before going to bed, and making the bedroom a cool, quiet and dark space   History of prostate cancer (Goal: minimize symptoms of urinary urgency/frequency) -Uncontrolled -Current treatment   . Oxybutynin ER 10 mg 1 tablet every evening -Medications previously tried: none  -Recommended to discuss with urologist about switching Oxybutynin to more effective alternative   Health Maintenance -Vaccine gaps: Shingles, tetanus -Current therapy:   Multivitamin 1 tablet daily  Gin raisin 8-9 raisins (anti-inflammatory for thumbs, knees)  Turmeric 400 mg 1 tablet daily (feet pain) -Educated on Cost vs benefit of each product must be carefully weighed by individual consumer -Patient is satisfied with current therapy and denies issues -Recommended to continue current medication  Patient Goals/Self-Care Activities . Over the next 120 days, patient will:  - take medications as prescribed check blood pressure a few times a week, document, and provide at future appointments  Follow Up Plan: Telephone follow up appointment with care management team member scheduled for: The care management team will reach out to the patient again over the next 60 days.       Medication Assistance: Xarelto is high cost but with current income, patient does not qualify for patient assistance  Patient's preferred pharmacy is:  [id number] [place]Phone: [phone]: [address]# [address] [place]Phone: [phone]: [phone] Uses pill box? No - takes most in the morning and only 2 at night Pt endorses 95% compliance - missed one in last month  We discussed: Current pharmacy is preferred with insurance plan and patient is satisfied with pharmacy services Patient decided to: Continue current medication management strategy  Care Plan and Follow Up Patient Decision:  Patient agrees to Care Plan and Follow-up.  Plan: Telephone follow up appointment with care management team member scheduled for:  4 months and  The care management team will reach out to the patient again over the next 60 days days.  MWatt Climes  Kipp Brood, PharmD [place] Pharmacist [place] at [place] [phone]

## 2020-04-05 ENCOUNTER — Telehealth: Payer: Self-pay | Admitting: Family Medicine

## 2020-04-05 ENCOUNTER — Ambulatory Visit: Payer: Medicare Other | Admitting: Adult Health

## 2020-04-05 NOTE — Telephone Encounter (Signed)
Pt's came in today to get a bp check after receiving readings at home of 220s/120s.  Pt was using a wrist cuff.  I checked his left arm and received a reading of 154/86 and his right arm 148/84.  After I checked his bp he checked his reading with his wrist machine.  Reading was the same as at home.  He placed the machine in the trash.  Advised him to get an Omron machine.  He needed to know how to get a prescription for his Xarelto for a cheaper price.  Current price is over $500 a month.  He has a deductible of $4000.  Spoke to [place] and was not able to come to a conclusion.  Advised the pt to speak with cardiology when he goes for his appointment on 04/06/20.  Will forward to [facility] as [place].

## 2020-04-06 ENCOUNTER — Other Ambulatory Visit: Payer: Self-pay

## 2020-04-06 ENCOUNTER — Ambulatory Visit (HOSPITAL_COMMUNITY)
Admission: RE | Admit: 2020-04-06 | Discharge: 2020-04-06 | Disposition: A | Discharge status: Home / Self Care | Payer: Medicare Other | Source: Ambulatory Visit | Attending: Physician Assistant | Admitting: Physician Assistant

## 2020-04-06 ENCOUNTER — Telehealth: Payer: Self-pay | Admitting: Adult Health

## 2020-04-06 ENCOUNTER — Encounter (HOSPITAL_COMMUNITY): Payer: Self-pay | Admitting: Physician Assistant

## 2020-04-06 VITALS — BP 150/70 | HR 48 | Ht 69.0 in | Wt 192.8 lb

## 2020-04-06 DIAGNOSIS — I4510 Unspecified right bundle-branch block: Secondary | ICD-10-CM | POA: Diagnosis not present

## 2020-04-06 DIAGNOSIS — D6869 Other thrombophilia: Secondary | ICD-10-CM | POA: Diagnosis not present

## 2020-04-06 DIAGNOSIS — Z87891 Personal history of nicotine dependence: Secondary | ICD-10-CM | POA: Insufficient documentation

## 2020-04-06 DIAGNOSIS — I10 Essential (primary) hypertension: Secondary | ICD-10-CM | POA: Insufficient documentation

## 2020-04-06 DIAGNOSIS — I4819 Other persistent atrial fibrillation: Secondary | ICD-10-CM | POA: Diagnosis not present

## 2020-04-06 MED ORDER — RIVAROXABAN 20 MG PO TABS: 20.0000 mg | ORAL_TABLET | Freq: Every day | ORAL | 0 refills | Status: DC

## 2020-04-06 MED ORDER — RIVAROXABAN 20 MG PO TABS: 20.0000 mg | ORAL_TABLET | Freq: Every day | ORAL | 3 refills | Status: DC

## 2020-04-06 NOTE — Telephone Encounter (Signed)
Spoke to the pt.  He seen cardiology today.  He said everything went well.  He discussed starting Warfarin with them but hasn't made up his mind.  He just paid over $500 for a 30 day supply of the Xarelto two days ago.  He is also aware of a program that Xarelto patient assistance is running but it doesn't start until April. He will make up his mind and will call back.  Nothing further needed at this time.

## 2020-04-06 NOTE — Telephone Encounter (Signed)
Pt is calling in stating that he needs a call back about his blood thinner and would not elaborate any further.

## 2020-04-06 NOTE — Progress Notes (Signed)
Primary Care Physician: Dorothyann Peng, NP Primary Cardiologist: none Primary Electrophysiologist: none Referring Physician: [name] is a 70 y.o. male with a history of HTN and atrial fibrillation who presents for consultation in the [facility].  The patient was initially diagnosed with atrial fibrillation 03/27/20 after presenting to his PCP office. Patient reports his Apple Watch alerted him that he was in afib on 01/30/20 but did not have any other alerts until 03/2020. ECG at his PCP office confirmed afib. He was started on metoprolol for rate control and Xarelto for a CHADS2VASC score of 2. Since starting metoprolol he has not had any further afib alerts on his watch. He admits to drinking 6-8 beers per night.   Today, he denies symptoms of palpitations, chest pain, shortness of breath, orthopnea, PND, lower extremity edema, dizziness, presyncope, syncope, snoring, daytime somnolence, bleeding, or neurologic sequela. The patient is tolerating medications without difficulties and is otherwise without complaint today.    Atrial Fibrillation Risk Factors:  he does not have symptoms or diagnosis of sleep apnea. he does not have a history of rheumatic fever. he does have a history of alcohol use. The patient does have a history of early familial atrial fibrillation or other arrhythmias. Father had afib.  he has a BMI of Body mass index is 28.47 kg/m.Marland Kitchen Filed Weights   04/06/20 1039  Weight: 87.5 kg    Family History  Problem Relation Age of Onset  . Arthritis Father   . Heart disease Father   . Hypertension Father   . Bladder Cancer Father   . Colon polyps Father   . Cancer Father        bladder  . Heart disease Mother   . Hypertension Mother   . Bladder Cancer Mother   . Glaucoma Mother   . Dementia Brother   . Colon cancer Neg Hx   . Esophageal cancer Neg Hx   . Rectal cancer Neg Hx   . Stomach cancer Neg Hx   .  Breast cancer Neg Hx   . Prostate cancer Neg Hx      Atrial Fibrillation Management history:  Previous antiarrhythmic drugs: none Previous cardioversions: none Previous ablations: none CHADS2VASC score: 2 Anticoagulation history: Xarelto   Past Medical History:  Diagnosis Date  . Allergy   . Arthritis   . Diabetes mellitus without complication ([place]) dx 59-56-38   type 2 diet controlled   . GERD (gastroesophageal reflux disease)   . Hypertension   . Prostate cancer [facility]) 2013   prostate   Past Surgical History:  Procedure Laterality Date  . COLONOSCOPY    . colonscopy     x 2  . ELBOW SURGERY Right 2007  . POLYPECTOMY    . PROSTATE BIOPSY     x 3  . PROSTATE BIOPSY N/A 01/21/2016   Procedure: BIOPSY TRANSRECTAL ULTRASONIC PROSTATE (TUBP);  Surgeon: Raynelle Bring, MD;  Location: [facility];  Service: Urology;  Laterality: N/A;  . PROSTATE BIOPSY N/A 07/15/2018   Procedure: NEEDLE BIOPSY TRANSRECTAL ULTRASONIC PROSTATE (TUBP);  Surgeon: Raynelle Bring, MD;  Location: [facility];  Service: Urology;  Laterality: N/A;    Current Outpatient Medications  Medication Sig Dispense Refill  . metoprolol tartrate (LOPRESSOR) 25 MG tablet Take 1 tablet (25 mg total) by mouth 2 (two) times daily. 180 tablet 3  . Multiple Vitamin (MULTIVITAMIN) tablet Take 1 tablet by mouth daily.    Marland Kitchen OVER  THE COUNTER MEDICATION Gin Raisin for joint health    . oxybutynin (DITROPAN-XL) 10 MG 24 hr tablet Take 10 mg by mouth every evening.     . pravastatin (PRAVACHOL) 20 MG tablet Take 1 tablet (20 mg total) by mouth daily. 90 tablet 3  . traZODone (DESYREL) 50 MG tablet TAKE 1 TABLET (50 MG TOTAL) BY MOUTH AT BEDTIME AS NEEDED. 90 tablet 1  . TURMERIC PO Take by mouth.    . rivaroxaban (XARELTO) 20 MG TABS tablet Take 1 tablet (20 mg total) by mouth daily with supper. 30 tablet 3   No current facility-administered medications for this encounter.    Allergies  Allergen Reactions  . Codeine Nausea And  Vomiting    Social History   Socioeconomic History  . Marital status: Married    Spouse name: Not on file  . Number of children: 1  . Years of education: Not on file  . Highest education level: Not on file  Occupational History  . Not on file  Tobacco Use  . Smoking status: Former Smoker    Packs/day: 2.00    Years: 25.00    Pack years: 50.00    Types: Cigarettes    Quit date: 02/18/2004    Years since quitting: 16.1  . Smokeless tobacco: Never Used  . Tobacco comment: per pt stopped in 2006   Vaping Use  . Vaping Use: Former  . Quit date: 02/18/2008  Substance and Sexual Activity  . Alcohol use: Yes    Alcohol/week: 6.0 - 8.0 standard drinks    Types: 6 - 8 Cans of beer per week    Comment: 10 beers per day   . Drug use: No  . Sexual activity: Not Currently  Other Topics Concern  . 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    Two Children who both live locally.       He likes to sleep.    Social Determinants of Health   Financial Resource Strain: Low Risk   . Difficulty of Paying Living Expenses: Not hard at all  Food Insecurity: No Food Insecurity  . Worried About Charity fundraiser in the Last Year: Never true  . Ran Out of Food in the Last Year: Never true  Transportation Needs: No Transportation Needs  . Lack of Transportation (Medical): No  . Lack of Transportation (Non-Medical): No  Physical Activity: Sufficiently Active  . Days of Exercise per Week: 7 days  . Minutes of Exercise per Session: 60 min  Stress: No Stress Concern Present  . Feeling of Stress : Not at all  Social Connections: Moderately Isolated  . Frequency of Communication with Friends and Family: More than three times a week  . Frequency of Social Gatherings with Friends and Family: Twice a week  . Attends Religious Services: Never  . Active Member of Clubs or Organizations: No  . Attends Archivist  Meetings: Never  . Marital Status: Married  Human resources officer Violence: Not At Risk  . Fear of Current or Ex-Partner: No  . Emotionally Abused: No  . Physically Abused: No  . Sexually Abused: No     ROS- All systems are reviewed and negative except as per the HPI above.  Physical Exam: Vitals:   04/06/20 1039  BP: (!) 150/70  Pulse: (!) 48  Weight: 87.5 kg  Height: 5\' 9"  (1.753  m)    GEN- The patient is well appearing, alert and oriented x 3 today.   Head- normocephalic, atraumatic Eyes-  Sclera clear, conjunctiva pink Ears- hearing intact Oropharynx- clear Neck- supple  Lungs- Clear to ausculation bilaterally, normal work of breathing Heart- Regular rate and rhythm, bradycardia, no murmurs, rubs or gallops  GI- soft, NT, ND, + BS Extremities- no clubbing, cyanosis, or edema MS- no significant deformity or atrophy Skin- no rash or lesion Psych- euthymic mood, full affect Neuro- strength and sensation are intact  Wt Readings from Last 3 Encounters:  04/06/20 87.5 kg  03/27/20 89.8 kg  06/09/19 90.5 kg    EKG today demonstrates  SB Vent. rate 48 BPM PR interval 174 ms QRS duration 94 ms QT/QTc 426/380 ms  Epic records are reviewed at length today  CHA2DS2-VASc Score = 2  The patient's score is based upon: CHF History: No HTN History: Yes Diabetes History: No Stroke History: No Vascular Disease History: No Age Score: 1 Gender Score: 0      ASSESSMENT AND PLAN: 1. Persistent Atrial Fibrillation (ICD10:  I48.19) The patient's CHA2DS2-VASc score is 2, indicating a 2.2% annual risk of stroke.   General education about afib provided and questions answered. We also discussed his stroke risk and the risks and benefits of anticoagulation. Check echocardiogram Continue Xarelto 20 mg daily Continue metoprolol 25 mg BID  2. Secondary Hypercoagulable State (ICD10:  D68.69) The patient is at significant risk for stroke/thromboembolism based upon his CHA2DS2-VASc  Score of 2.  Continue Rivaroxaban (Xarelto).   3. HTN Stable, no changes today.   Follow up in the AF clinic in one month.   [facility] [address] [place], [place] [address] [phone] 04/06/2020 12:07 PM

## 2020-04-11 NOTE — Patient Instructions (Addendum)
Hi Ed,  It was lovely getting to speak with you again! Below is a summary of some of the topics we discussed. I do think it's important for you to continue checking your blood pressure at home a couple times a week to make sure it is staying stable with your current medications. I'll have my assistant reach out to you in a couple of months to check up on you.   Sorry again I couldn't be more helpful with the cost of Xarelto! Let me know if you finances change or the price shoots up.  Please give me a call if you need anything before our follow up!  Best, Maddie  [name], PharmD [facility] Clinical Pharmacist [place] [facility] [place]    Visit Information  Goals Addressed   None    Patient Care Plan: CCM Pharmacy Care Plan    Problem Identified: Problem: Hypertension, Hyperlipidemia, Diabetes, Atrial Fibrillation and insomnia and history of prostate cancer     Long-Range Goal: Patient-Specific Goal   Start Date: 04/02/2020  Expected End Date: 04/02/2021  This Visit's Progress: On track  Priority: High  Note:   Current Barriers:  . Unable to independently monitor therapeutic efficacy . Unable to achieve control of cholesterol   Pharmacist Clinical Goal(s):  Marland Kitchen Over the next 120 days, patient will verbalize ability to afford treatment regimen . achieve control of cholesterol as evidenced by lipid panel  through collaboration with PharmD and provider.   Interventions: . 1:1 collaboration with Dorothyann Peng, NP regarding development and update of comprehensive plan of care as evidenced by provider attestation and co-signature . Inter-disciplinary care team collaboration (see longitudinal plan of care) . Comprehensive medication review performed; medication list updated in electronic medical record  Hypertension (BP goal <140/90) -Controlled -Current treatment: . Metoprolol tartrate 25 mg 1 tablet twice daily -Medications previously tried: quinapril  (switched for Afib) -Current home readings: 141/93 (usually 139/70) -Current dietary habits: Patient uses some salt when cooking but is conscious of salt content and uses sea salt -Current exercise habits: 2 mile walk each morning -Denies hypotensive/hypertensive symptoms -Educated on Exercise goal of 150 minutes per week; Importance of home blood pressure monitoring; -Counseled to monitor BP at home a couple times a week, document, and provide log at future appointments -Recommended to continue current medication  Hyperlipidemia: (LDL goal < 100) -Uncontrolled -Current treatment: . Pravastatin 20 mg 1 tablet daily  -Medications previously tried: atorvastatin (fatigue/foggy) -Current dietary patterns: drinks about 7-10 beers every day and has switched from IPA to light beer -Current exercise habits: walking 2 miles a day -Educated on Cholesterol goals;  Benefits of statin for ASCVD risk reduction; Importance of limiting foods high in cholesterol; Exercise goal of 150 minutes per week; -Recommended to continue current medication  Atrial Fibrillation (Goal: prevent stroke and major bleeding) -Controlled -CHADSVASC: 3 -Current treatment: . Rate control: metoprolol tartrate 12.5 mg 1 tablet twice daily . Anticoagulation: Xarelto 20 mg 1 tablet with dinner -Medications previously tried: none -Home BP and HR readings: unable to provide  -Counseled on bleeding risk associated with Xarelto and importance of self-monitoring for signs/symptoms of bleeding; avoidance of NSAIDs due to increased bleeding risk with anticoagulants; -Recommended to continue current medication Assessed patient finances. Both Xarelto and Eliquis are the same under his insurance and he has a high deductible to pay which is why his copay will be high for 2 months.  Insomnia (Goal: improve quality and quantity of sleep) -Controlled -Current treatment  . Trazodone  50 mg 1 tablet at bedtime as needed -Medications  previously tried: none  -Recommended to continue current medication Counseled on practicing good sleep hygiene by setting a sleep schedule and maintaining it, avoid excessive napping, following a nightly routine, avoiding screen time for 30-60 minutes before going to bed, and making the bedroom a cool, quiet and dark space   History of prostate cancer (Goal: minimize symptoms of urinary urgency/frequency) -Uncontrolled -Current treatment  . Oxybutynin ER 10 mg 1 tablet every evening -Medications previously tried: none  -Recommended to discuss with urologist about switching Oxybutynin to more effective alternative   Health Maintenance -Vaccine gaps: Shingles, tetanus -Current therapy:   Multivitamin 1 tablet daily  Gin raisin 8-9 raisins (anti-inflammatory for thumbs, knees)  Turmeric 400 mg 1 tablet daily (feet pain) -Educated on Cost vs benefit of each product must be carefully weighed by individual consumer -Patient is satisfied with current therapy and denies issues -Recommended to continue current medication  Patient Goals/Self-Care Activities . Over the next 120 days, patient will:  - take medications as prescribed check blood pressure a few times a week, document, and provide at future appointments  Follow Up Plan: Telephone follow up appointment with care management team member scheduled for: The care management team will reach out to the patient again over the next 60 days.        Patient verbalizes understanding of instructions provided today and agrees to view in [place].  Telephone follow up appointment with pharmacy team member scheduled for: 4 months  Viona Gilmore, [facility]

## 2020-04-12 ENCOUNTER — Encounter (HOSPITAL_COMMUNITY): Payer: Self-pay | Admitting: Physician Assistant

## 2020-04-12 ENCOUNTER — Other Ambulatory Visit: Payer: Self-pay

## 2020-04-12 ENCOUNTER — Ambulatory Visit (HOSPITAL_COMMUNITY)
Admission: RE | Admit: 2020-04-12 | Discharge: 2020-04-12 | Disposition: A | Discharge status: Home / Self Care | Payer: Medicare Other | Source: Ambulatory Visit | Attending: Physician Assistant | Admitting: Physician Assistant

## 2020-04-12 VITALS — BP 148/82 | HR 92 | Ht 69.0 in | Wt 198.6 lb

## 2020-04-12 DIAGNOSIS — D6869 Other thrombophilia: Secondary | ICD-10-CM | POA: Diagnosis not present

## 2020-04-12 DIAGNOSIS — Z87891 Personal history of nicotine dependence: Secondary | ICD-10-CM | POA: Diagnosis not present

## 2020-04-12 DIAGNOSIS — Z79899 Other long term (current) drug therapy: Secondary | ICD-10-CM | POA: Diagnosis not present

## 2020-04-12 DIAGNOSIS — I10 Essential (primary) hypertension: Secondary | ICD-10-CM | POA: Insufficient documentation

## 2020-04-12 DIAGNOSIS — Z8249 Family history of ischemic heart disease and other diseases of the circulatory system: Secondary | ICD-10-CM | POA: Diagnosis not present

## 2020-04-12 DIAGNOSIS — Z7901 Long term (current) use of anticoagulants: Secondary | ICD-10-CM | POA: Diagnosis not present

## 2020-04-12 DIAGNOSIS — I4819 Other persistent atrial fibrillation: Secondary | ICD-10-CM | POA: Insufficient documentation

## 2020-04-12 NOTE — Progress Notes (Signed)
Primary Care Physician: Dorothyann Peng, NP Primary Cardiologist: none Primary Electrophysiologist: none Referring Physician: [name] is a 70 y.o. male with a history of HTN and atrial fibrillation who presents for follow up in the [facility].  The patient was initially diagnosed with atrial fibrillation 03/27/20 after presenting to his PCP office. Patient reports his Apple Watch alerted him that he was in afib on 01/30/20 but did not have any other alerts until 03/2020. ECG at his PCP office confirmed afib. He was started on metoprolol for rate control and Xarelto for a CHADS2VASC score of 2. He admits to drinking 6-8 beers per night.   On follow up today, patient reports that he has been in afib for the past 5 days by his Apple Watch. He has some intermittent lightheadedness. She denies any missed doses of Xarelto.   Today, he denies symptoms of palpitations, chest pain, shortness of breath, orthopnea, PND, lower extremity edema, presyncope, syncope, snoring, daytime somnolence, bleeding, or neurologic sequela. The patient is tolerating medications without difficulties and is otherwise without complaint today.    Atrial Fibrillation Risk Factors:  he does not have symptoms or diagnosis of sleep apnea. he does not have a history of rheumatic fever. he does have a history of alcohol use. The patient does have a history of early familial atrial fibrillation or other arrhythmias. Father had afib.  he has a BMI of Body mass index is 29.33 kg/m.Marland Kitchen Filed Weights   04/12/20 1422  Weight: 90.1 kg    Family History  Problem Relation Age of Onset  . Arthritis Father   . Heart disease Father   . Hypertension Father   . Bladder Cancer Father   . Colon polyps Father   . Cancer Father        bladder  . Heart disease Mother   . Hypertension Mother   . Bladder Cancer Mother   . Glaucoma Mother   . Dementia Brother   . Colon cancer Neg  Hx   . Esophageal cancer Neg Hx   . Rectal cancer Neg Hx   . Stomach cancer Neg Hx   . Breast cancer Neg Hx   . Prostate cancer Neg Hx      Atrial Fibrillation Management history:  Previous antiarrhythmic drugs: none Previous cardioversions: none Previous ablations: none CHADS2VASC score: 2 Anticoagulation history: Xarelto   Past Medical History:  Diagnosis Date  . Allergy   . Arthritis   . Diabetes mellitus without complication ([place]) dx [id number]   type 2 diet controlled   . GERD (gastroesophageal reflux disease)   . Hypertension   . Prostate cancer [facility]) 2013   prostate   Past Surgical History:  Procedure Laterality Date  . COLONOSCOPY    . colonscopy     x 2  . ELBOW SURGERY Right 2007  . POLYPECTOMY    . PROSTATE BIOPSY     x 3  . PROSTATE BIOPSY N/A 01/21/2016   Procedure: BIOPSY TRANSRECTAL ULTRASONIC PROSTATE (TUBP);  Surgeon: Raynelle Bring, MD;  Location: [facility];  Service: Urology;  Laterality: N/A;  . PROSTATE BIOPSY N/A 07/15/2018   Procedure: NEEDLE BIOPSY TRANSRECTAL ULTRASONIC PROSTATE (TUBP);  Surgeon: Raynelle Bring, MD;  Location: [facility];  Service: Urology;  Laterality: N/A;    Current Outpatient Medications  Medication Sig Dispense Refill  . metoprolol tartrate (LOPRESSOR) 25 MG tablet Take 1 tablet (25 mg total) by mouth 2 (two) times  daily. 180 tablet 3  . Multiple Vitamin (MULTIVITAMIN) tablet Take 1 tablet by mouth daily.    Marland Kitchen OVER THE COUNTER MEDICATION Gin Raisin for joint health    . oxybutynin (DITROPAN-XL) 10 MG 24 hr tablet Take 10 mg by mouth every evening.     . pravastatin (PRAVACHOL) 20 MG tablet Take 1 tablet (20 mg total) by mouth daily. 90 tablet 3  . rivaroxaban (XARELTO) 20 MG TABS tablet Take 1 tablet (20 mg total) by mouth daily with supper. 30 tablet 3  . traZODone (DESYREL) 50 MG tablet TAKE 1 TABLET (50 MG TOTAL) BY MOUTH AT BEDTIME AS NEEDED. 90 tablet 1  . TURMERIC PO Take by mouth.     No current facility-administered  medications for this encounter.    Allergies  Allergen Reactions  . Codeine Nausea And Vomiting    Social History   Socioeconomic History  . Marital status: Married    Spouse name: Not on file  . Number of children: 1  . Years of education: Not on file  . Highest education level: Not on file  Occupational History  . Not on file  Tobacco Use  . Smoking status: Former Smoker    Packs/day: 2.00    Years: 25.00    Pack years: 50.00    Types: Cigarettes    Quit date: 02/18/2004    Years since quitting: 16.1  . Smokeless tobacco: Never Used  . Tobacco comment: per pt stopped in 2006   Vaping Use  . Vaping Use: Former  . Quit date: 02/18/2008  Substance and Sexual Activity  . Alcohol use: Yes    Alcohol/week: 6.0 - 8.0 standard drinks    Types: 6 - 8 Cans of beer per week    Comment: 10 beers per day   . Drug use: No  . Sexual activity: Not Currently  Other Topics Concern  . 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    Two Children who both live locally.       He likes to sleep.    Social Determinants of Health   Financial Resource Strain: Low Risk   . Difficulty of Paying Living Expenses: Not hard at all  Food Insecurity: No Food Insecurity  . Worried About Charity fundraiser in the Last Year: Never true  . Ran Out of Food in the Last Year: Never true  Transportation Needs: No Transportation Needs  . Lack of Transportation (Medical): No  . Lack of Transportation (Non-Medical): No  Physical Activity: Sufficiently Active  . Days of Exercise per Week: 7 days  . Minutes of Exercise per Session: 60 min  Stress: No Stress Concern Present  . Feeling of Stress : Not at all  Social Connections: Moderately Isolated  . Frequency of Communication with Friends and Family: More than three times a week  . Frequency of Social Gatherings with Friends and Family: Twice a week  . Attends Religious  Services: Never  . Active Member of Clubs or Organizations: No  . Attends Archivist Meetings: Never  . Marital Status: Married  Human resources officer Violence: Not At Risk  . Fear of Current or Ex-Partner: No  . Emotionally Abused: No  . Physically Abused: No  . Sexually Abused: No     ROS- All systems are reviewed and negative except as per the HPI above.  Physical Exam: Vitals:  04/12/20 1422  BP: (!) 148/82  Pulse: 92  Weight: 90.1 kg  Height: 5\' 9"  (1.753 m)    GEN- The patient is well appearing, alert and oriented x 3 today.   HEENT-head normocephalic, atraumatic, sclera clear, conjunctiva pink, hearing intact, trachea midline. Lungs- Clear to ausculation bilaterally, normal work of breathing Heart- irregular rate and rhythm, no murmurs, rubs or gallops  GI- soft, NT, ND, + BS Extremities- no clubbing, cyanosis, or edema MS- no significant deformity or atrophy Skin- no rash or lesion Psych- euthymic mood, full affect Neuro- strength and sensation are intact   Wt Readings from Last 3 Encounters:  04/12/20 90.1 kg  04/06/20 87.5 kg  03/27/20 89.8 kg    EKG today demonstrates  Afib  Vent. rate 92 BPM QRS duration 90 ms QT/QTc 344/425 ms  Epic records are reviewed at length today  CHA2DS2-VASc Score = 2  The patient's score is based upon: CHF History: No HTN History: Yes Diabetes History: No Stroke History: No Vascular Disease History: No Age Score: 1 Gender Score: 0      ASSESSMENT AND PLAN: 1. Persistent Atrial Fibrillation (ICD10:  I48.19) The patient's CHA2DS2-VASc score is 2, indicating a 2.2% annual risk of stroke.   Patient in rate controlled afib today. We discussed AAD (flecainide) vs ablation today. Will revisit after echocardiogram tomorrow. If he has structural changes, would favor ablation.  Continue Xarelto 20 mg daily. Samples given. Continue metoprolol 25 mg BID  2. Secondary Hypercoagulable State (ICD10:  D68.69) The  patient is at significant risk for stroke/thromboembolism based upon his CHA2DS2-VASc Score of 2.  Continue Rivaroxaban (Xarelto).   3. HTN Stable, no changes today.   Follow up with AF clinic as scheduled. Sooner if AAD started.    [facility] [address] [place], [place] [address] [phone] 04/12/2020 2:29 PM

## 2020-04-13 ENCOUNTER — Other Ambulatory Visit (HOSPITAL_COMMUNITY): Payer: Self-pay | Admitting: *Deleted

## 2020-04-13 ENCOUNTER — Ambulatory Visit (HOSPITAL_COMMUNITY)
Admission: RE | Admit: 2020-04-13 | Discharge: 2020-04-13 | Disposition: A | Discharge status: Home / Self Care | Payer: Medicare Other | Source: Ambulatory Visit | Attending: Physician Assistant | Admitting: Physician Assistant

## 2020-04-13 ENCOUNTER — Other Ambulatory Visit: Payer: Self-pay

## 2020-04-13 DIAGNOSIS — I10 Essential (primary) hypertension: Secondary | ICD-10-CM | POA: Insufficient documentation

## 2020-04-13 DIAGNOSIS — I4819 Other persistent atrial fibrillation: Secondary | ICD-10-CM | POA: Insufficient documentation

## 2020-04-13 DIAGNOSIS — E119 Type 2 diabetes mellitus without complications: Secondary | ICD-10-CM | POA: Insufficient documentation

## 2020-04-13 DIAGNOSIS — R6889 Other general symptoms and signs: Secondary | ICD-10-CM

## 2020-04-13 LAB — ECHOCARDIOGRAM COMPLETE: S' Lateral: 3.3 cm

## 2020-04-14 NOTE — Progress Notes (Signed)
Cardiology Office Note:    Date:  04/16/2020   ID:  James Edwards, DOB Jun 24, 1950, MRN [id number]  PCP:  Dorothyann Peng, NP   [place]  Cardiologist:  No primary care provider on file.  Advanced Practice Provider:  No care team member to display Electrophysiologist:  None   Referring MD: Oliver Barre, PA    History of Present Illness:    James Edwards is a 70 y.o. male with a hx of  DMII, HTN, persistent Afib, prostate cancer and GERD who was referred by Malka So for further evaluation of inferior basal hypokinesis noted on TTE.  The patient was initially diagnosed with atrial fibrillation 03/27/20 after presenting to his PCP office. Patient reports his Apple Watch alerted him that he was in afib on 01/30/20 but did not have any other alerts until 03/2020. ECG at his PCP office confirmed afib. He was started on metoprolol for rate control and Xarelto for a CHADS2VASC score of 2. TTE was obtained which showed LVEF 55% with inferior basal hypokinesis, moderate LAE, mild/mod aortic sclerosis without stenosis.  Today, the patient states that he goes in and out of Afib usually from 8-11:30pm at night. He overall asymptomatic with no palpitations, lightheadedness, SOB, chest pain. Minor fatigue. No bleeding issues. Denies exertional chest pain, SOB, diaphoresis or lightheadedness. He is active at baseline with no exertional limitations. Blood pressures running high 140s at home. States he is interested in an ablation procedure as multiple of his friends have had it and had great results.  Past Medical History:  Diagnosis Date  . Allergy   . Arthritis   . Diabetes mellitus without complication ([place]) dx 95-18-84   type 2 diet controlled   . GERD (gastroesophageal reflux disease)   . Hypertension   . Prostate cancer [facility]) 2013   prostate    Past Surgical History:  Procedure Laterality Date  . COLONOSCOPY    . colonscopy     x 2  . ELBOW SURGERY  Right 2007  . POLYPECTOMY    . PROSTATE BIOPSY     x 3  . PROSTATE BIOPSY N/A 01/21/2016   Procedure: BIOPSY TRANSRECTAL ULTRASONIC PROSTATE (TUBP);  Surgeon: Raynelle Bring, MD;  Location: [facility];  Service: Urology;  Laterality: N/A;  . PROSTATE BIOPSY N/A 07/15/2018   Procedure: NEEDLE BIOPSY TRANSRECTAL ULTRASONIC PROSTATE (TUBP);  Surgeon: Raynelle Bring, MD;  Location: [facility];  Service: Urology;  Laterality: N/A;    Current Medications: Current Meds  Medication Sig  . losartan (COZAAR) 50 MG tablet Take 1 tablet (50 mg total) by mouth daily.  . metoprolol tartrate (LOPRESSOR) 25 MG tablet Take 1 tablet (25 mg total) by mouth 2 (two) times daily.  . Multiple Vitamin (MULTIVITAMIN) tablet Take 1 tablet by mouth daily.  Marland Kitchen OVER THE COUNTER MEDICATION Gin Raisin for joint health  . oxybutynin (DITROPAN-XL) 10 MG 24 hr tablet Take 10 mg by mouth every evening.   . pravastatin (PRAVACHOL) 20 MG tablet Take 1 tablet (20 mg total) by mouth daily.  . rivaroxaban (XARELTO) 20 MG TABS tablet Take 1 tablet (20 mg total) by mouth daily with supper.  . traZODone (DESYREL) 50 MG tablet TAKE 1 TABLET (50 MG TOTAL) BY MOUTH AT BEDTIME AS NEEDED.  Marland Kitchen TURMERIC PO Take by mouth.     Allergies:   Codeine   Social History   Socioeconomic History  . Marital status: Married    Spouse name: Not  on file  . Number of children: 1  . Years of education: Not on file  . Highest education level: Not on file  Occupational History  . Not on file  Tobacco Use  . Smoking status: Former Smoker    Packs/day: 2.00    Years: 25.00    Pack years: 50.00    Types: Cigarettes    Quit date: 02/18/2004    Years since quitting: 16.1  . Smokeless tobacco: Never Used  . Tobacco comment: per pt stopped in 2006   Vaping Use  . Vaping Use: Former  . Quit date: 02/18/2008  Substance and Sexual Activity  . Alcohol use: Yes    Alcohol/week: 6.0 - 8.0 standard drinks    Types: 6 - 8 Cans of beer per week    Comment: 10  beers per day   . Drug use: No  . Sexual activity: Not Currently  Other Topics Concern  . 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    Two Children who both live locally.       He likes to sleep.    Social Determinants of Health   Financial Resource Strain: Low Risk   . Difficulty of Paying Living Expenses: Not hard at all  Food Insecurity: No Food Insecurity  . Worried About Charity fundraiser in the Last Year: Never true  . Ran Out of Food in the Last Year: Never true  Transportation Needs: No Transportation Needs  . Lack of Transportation (Medical): No  . Lack of Transportation (Non-Medical): No  Physical Activity: Sufficiently Active  . Days of Exercise per Week: 7 days  . Minutes of Exercise per Session: 60 min  Stress: No Stress Concern Present  . Feeling of Stress : Not at all  Social Connections: Moderately Isolated  . Frequency of Communication with Friends and Family: More than three times a week  . Frequency of Social Gatherings with Friends and Family: Twice a week  . Attends Religious Services: Never  . Active Member of Clubs or Organizations: No  . Attends Archivist Meetings: Never  . Marital Status: Married     Family History: The patient's family history includes Arthritis in his father; Bladder Cancer in his father and mother; Cancer in his father; Colon polyps in his father; Dementia in his brother; Glaucoma in his mother; Heart disease in his father and mother; Hypertension in his father and mother. There is no history of Colon cancer, Esophageal cancer, Rectal cancer, Stomach cancer, Breast cancer, or Prostate cancer.  ROS:   Please see the history of present illness.    Review of Systems  Constitutional: Negative for chills and fever.  HENT: Negative for hearing loss.   Eyes: Negative for blurred vision and redness.  Respiratory: Negative for shortness of breath.    Cardiovascular: Negative for chest pain, palpitations, orthopnea, claudication, leg swelling and PND.  Gastrointestinal: Negative for blood in stool, melena, nausea and vomiting.  Genitourinary: Negative for hematuria.  Musculoskeletal: Negative for falls.  Neurological: Negative for dizziness and loss of consciousness.  Endo/Heme/Allergies: Negative for polydipsia.  Psychiatric/Behavioral: Negative for substance abuse.    EKGs/Labs/Other Studies Reviewed:    The following studies were reviewed today: TTE 05/06/20: IMPRESSIONS  1. Inferior basal hypokinesis . Left ventricular ejection fraction, by  estimation, is 55%. The left ventricle has normal function. The left  ventricle has  no regional wall motion abnormalities. Left ventricular  diastolic parameters are indeterminate.  2. Right ventricular systolic function is normal. The right ventricular  size is normal. There is normal pulmonary artery systolic pressure.  3. Left atrial size was moderately dilated.  4. The mitral valve is abnormal. Trivial mitral valve regurgitation. No  evidence of mitral stenosis.  5. The aortic valve is tricuspid. There is moderate calcification of the  aortic valve. Aortic valve regurgitation is not visualized. Mild to  moderate aortic valve sclerosis/calcification is present, without any  evidence of aortic stenosis.  6. The inferior vena cava is normal in size with greater than 50%  respiratory variability, suggesting right atrial pressure of 3 mmHg.   Abdominal aortogram 03/2018:    Abdominal Aorta Findings:  +-----------+-------+----------+----------+--------+--------+--------+  Location  AP (cm)Trans (cm)PSV (cm/s)WaveformThrombusComments  +-----------+-------+----------+----------+--------+--------+--------+  Proximal  1.75  1.72   102                  +-----------+-------+----------+----------+--------+--------+--------+  Mid    2.22   2.00   113                  +-----------+-------+----------+----------+--------+--------+--------+  Distal   1.71  1.75   122                  +-----------+-------+----------+----------+--------+--------+--------+  RT CIA Prox0.9  0.9    407                  +-----------+-------+----------+----------+--------+--------+--------+  LT CIA Prox0.9  1.1    451                  +-----------+-------+----------+----------+--------+--------+--------+      Summary:  Abdominal Aorta: No evidence of an abdominal aortic aneurysm was  visualized. The largest aortic measurement is 2.2 cm.  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  +------------------+-------------+  Left Common Iliac >50% stenosis  +------------------+-------------+    Recent Labs: 05/24/2019: ALT 14; Hemoglobin 13.7; Platelets 238.0 03/27/2020: BUN 17; Creatinine, Ser 1.03; Potassium 4.7; Sodium 137; TSH 1.03  Recent Lipid Panel    Component Value Date/Time   CHOL 211 (H) 05/24/2019 0819   TRIG 97.0 05/24/2019 0819   HDL 66.30 05/24/2019 0819   CHOLHDL 3 05/24/2019 0819   VLDL 19.4 05/24/2019 0819   LDLCALC 125 (H) 05/24/2019 0819   LDLDIRECT 97.0 01/03/2016 0821     Risk Assessment/Calculations:    CHA2DS2-VASc Score = 2  This indicates a 2.2% annual risk of stroke. The patient's score is based upon: CHF History: No HTN History: Yes Diabetes History: No Stroke History: No Vascular Disease History: No Age Score: 1 Gender Score: 0    Physical Exam:    VS:  BP 138/88   Pulse 70   Ht 5\' 10"  (1.778 m)   Wt 196 lb 9.6 oz (89.2 kg)   SpO2 97%   BMI 28.21 kg/m     Wt Readings from Last 3 Encounters:  04/16/20 196 lb 9.6 oz (89.2 kg)  04/12/20 198 lb 9.6 oz (90.1 kg)  04/06/20 192 lb 12.8  oz (87.5 kg)     GEN:  Well nourished, well developed in no acute distress HEENT: Normal NECK: No JVD; No carotid bruits CARDIAC: RRR, no murmurs, rubs, gallops RESPIRATORY:  Clear to auscultation without rales, wheezing or rhonchi  ABDOMEN: Soft, non-tender, non-distended MUSCULOSKELETAL:  No edema; No deformity  SKIN: Warm and dry NEUROLOGIC:  Alert and oriented x 3 PSYCHIATRIC:  Normal affect   ASSESSMENT:    1. Hypokinesia   2. Chest pain due to myocardial ischemia, unspecified ischemic chest pain type   3. Persistent atrial fibrillation ([place])   4. Secondary hypercoagulable state ([place])   5. Mixed hyperlipidemia   6. Primary hypertension    PLAN:    In order of problems listed above:  #Inferobasal Hypokinesis: Noted on TTE that was obtained for work-up of Afib. No exertional chest pain or SOB. Patient is active at baseline without exercise limitations. No known history of CAD. -Check Coronary CTA -Will adjust statin pending CTA findings  #Persistent Afib: CHADs-vasc 2. Followed by Afib clinic. States he flips in and out of rhythm almost every night. Fortunately asymptomatic and is alerted by his apple watch. Very interested in ablation procedure -Continue xarelto 20mg  daily -Continue metop 25mg  BID -Check 7 day zio to assess afib burden -Refer to EP for discussion of possible ablation  #HTN: Elevated at home.  -Start losartan 50mg  daily -Repeat BMET in 1 week  #HLD: -Continue pravastatin 20mg  daily -Did not tolerate lipitor -Will adjust statin according to CTA     Medication Adjustments/Labs and Tests Ordered: Current medicines are reviewed at length with the patient today.  Concerns regarding medicines are outlined above.  Orders Placed This Encounter  Procedures  . CT CORONARY MORPH W/CTA COR W/SCORE W/CA W/CM &/OR WO/CM  . CT CORONARY FRACTIONAL FLOW RESERVE DATA PREP  . CT CORONARY FRACTIONAL FLOW RESERVE FLUID ANALYSIS  . Basic metabolic panel  .  Basic metabolic panel  . Ambulatory referral to Cardiac Electrophysiology  . LONG TERM MONITOR (3-14 DAYS)   Meds ordered this encounter  Medications  . losartan (COZAAR) 50 MG tablet    Sig: Take 1 tablet (50 mg total) by mouth daily.    Dispense:  90 tablet    Refill:  3    Patient Instructions  Medication Instructions:  Your physician has recommended you make the following change in your medication:  1.  START Losartan 50 mg taking 1 daily  *If you need a refill on your cardiac medications before your next appointment, please call your pharmacy*   Lab Work: 04/24/2020:  Come to the lab for a BMET (anytime after 7:30)  You will need another BMET prior to your Cardiac CT.  They will call and let you know when it will be scheduled.  If you have labs (blood work) drawn today and your tests are completely normal, you will receive your results only by: Marland Kitchen MyChart Message (if you have MyChart) OR . A paper copy in the mail If you have any lab test that is abnormal or we need to change your treatment, we will call you to review the results.   Testing/Procedures: [place]- Long Term Monitor Instructions   Your physician has requested you wear your ZIO patch monitor 7 days.   This is a single patch monitor.  [employer] supplies one patch monitor per enrollment.  Additional stickers are not available.   Please do not apply patch if you will be having a Nuclear Stress Test, Echocardiogram, Cardiac CT, MRI, or Chest Xray during the time frame you would be wearing the monitor. The patch cannot be worn during these tests.  You cannot remove and re-apply the ZIO XT patch monitor.   Your ZIO patch monitor will be sent USPS Priority mail from [facility] directly to your home address. The monitor may also be mailed  to a PO BOX if home delivery is not available.   It may take 3-5 days to receive your monitor after you have been enrolled.   Once you have received you monitor, please review  enclosed instructions.  Your monitor has already been registered assigning a specific monitor serial # to you.   Applying the monitor   Shave hair from upper left chest.   Hold abrader disc by orange tab.  Rub abrader in 40 strokes over left upper chest as indicated in your monitor instructions.   Clean area with 4 enclosed alcohol pads .  Use all pads to assure are is cleaned thoroughly.  Let dry.   Apply patch as indicated in monitor instructions.  Patch will be place under collarbone on left side of chest with arrow pointing upward.   Rub patch adhesive wings for 2 minutes.Remove white label marked "1".  Remove white label marked "2".  Rub patch adhesive wings for 2 additional minutes.   While looking in a mirror, press and release button in center of patch.  A small green light will flash 3-4 times .  This will be your only indicator the monitor has been turned on.     Do not shower for the first 24 hours.  You may shower after the first 24 hours.   Press button if you feel a symptom. You will hear a small click.  Record Date, Time and Symptom in the Patient Log Book.   When you are ready to remove patch, follow instructions on last 2 pages of Patient Log Book.  Stick patch monitor onto last page of Patient Log Book.   Place Patient Log Book in [place] box.  Use locking tab on box and tape box closed securely.  The [employer] has [employer] on it.  Please place in mailbox as soon as possible.  Your physician should have your test results approximately 7 days after the monitor has been mailed back to [facility].   Call [place] at [phone] if you have questions regarding your ZIO XT patch monitor.  Call them immediately if you see an orange light blinking on your monitor.   If your monitor falls off in less than 4 days contact our Monitor department at [phone].  If your monitor becomes loose or falls off after 4 days call [employer] at  [phone] for suggestions on securing your monitor.     Your physician has requested that you have cardiac CT. Cardiac computed tomography (CT) is a painless test that uses an x-ray machine to take clear, detailed pictures of your heart. For further information please visit [email or web address]. Please follow instruction sheet as BELOW.  Your cardiac CT will be scheduled at:  [facility] [address] [place], [place] [address] [phone]   If scheduled at [facility], please arrive at the [facility] main entrance (entrance A) of [facility] 30 minutes prior to test start time. Proceed to the [facility] Radiology Department (first floor) to check-in and test prep.  Please follow these instructions carefully (unless otherwise directed):  Hold all erectile dysfunction medications at least 3 days (72 hrs) prior to test.  On the Night Before the Test: . Be sure to Drink plenty of water. . Do not consume any caffeinated/decaffeinated beverages or chocolate 12 hours prior to your test. . Do not take any antihistamines 12 hours prior to your test.   On the Day of the Test: .  Drink plenty of water until 1 hour prior to the test. . Do not eat any food 4 hours prior to the test. . You may take your regular medications prior to the test.  Take metoprolol (Lopressor) 4 tablets two hours prior to test.        After the Test: . Drink plenty of water. . After receiving IV contrast, you may experience a mild flushed feeling. This is normal. . On occasion, you may experience a mild rash up to 24 hours after the test. This is not dangerous. If this occurs, you can take Benadryl 25 mg and increase your fluid intake. . If you experience trouble breathing, this can be serious. If it is severe call 911 IMMEDIATELY. If it is mild, please call our office.    Once we have confirmed authorization from your insurance company, we will call you to set up a date and  time for your test. Based on how quickly your insurance processes prior authorizations requests, please allow up to 4 weeks to be contacted for scheduling your Cardiac CT appointment. Be advised that routine Cardiac CT appointments could be scheduled as many as 8 weeks after your provider has ordered it.  For non-scheduling related questions, please contact the cardiac imaging nurse navigator should you have any questions/concerns: Marchia Bond, Cardiac Imaging Nurse Navigator Gordy Clement, Cardiac Imaging Nurse Navigator [facility] Direct Office Dial: [phone]   For scheduling needs, including cancellations and rescheduling, please call [place], [phone].    Follow-Up: At [facility], you and your health needs are our priority.  As part of our continuing mission to provide you with exceptional heart care, we have created designated Provider Care Teams.  These Care Teams include your primary Cardiologist (physician) and Advanced Practice Providers (APPs -  Physician Assistants and Nurse Practitioners) who all work together to provide you with the care you need, when you need it.  We recommend signing up for the patient portal called "MyChart".  Sign up information is provided on this After Visit Summary.  MyChart is used to connect with patients for Virtual Visits (Telemedicine).  Patients are able to view lab/test results, encounter notes, upcoming appointments, etc.  Non-urgent messages can be sent to your provider as well.   To learn more about what you can do with MyChart, go to [email or web address].    Your next appointment:   3 month(s)  The format for your next appointment:   In Person  Provider:   Gwyndolyn Kaufman, MD   You have been referred to EP to discuss Ablation   Other Instructions      Signed, Freada Bergeron, MD  04/16/2020 12:34 PM    [place]

## 2020-04-16 ENCOUNTER — Other Ambulatory Visit: Payer: Self-pay

## 2020-04-16 ENCOUNTER — Ambulatory Visit (INDEPENDENT_AMBULATORY_CARE_PROVIDER_SITE_OTHER): Payer: Medicare Other | Admitting: Cardiology

## 2020-04-16 ENCOUNTER — Ambulatory Visit (INDEPENDENT_AMBULATORY_CARE_PROVIDER_SITE_OTHER): Payer: Medicare Other

## 2020-04-16 ENCOUNTER — Encounter: Payer: Self-pay | Admitting: Cardiology

## 2020-04-16 ENCOUNTER — Encounter: Payer: Self-pay | Admitting: *Deleted

## 2020-04-16 VITALS — BP 138/88 | HR 70 | Ht 70.0 in | Wt 196.6 lb

## 2020-04-16 DIAGNOSIS — I259 Chronic ischemic heart disease, unspecified: Secondary | ICD-10-CM

## 2020-04-16 DIAGNOSIS — D6869 Other thrombophilia: Secondary | ICD-10-CM | POA: Diagnosis not present

## 2020-04-16 DIAGNOSIS — I10 Essential (primary) hypertension: Secondary | ICD-10-CM | POA: Diagnosis not present

## 2020-04-16 DIAGNOSIS — R6889 Other general symptoms and signs: Secondary | ICD-10-CM | POA: Diagnosis not present

## 2020-04-16 DIAGNOSIS — I4819 Other persistent atrial fibrillation: Secondary | ICD-10-CM

## 2020-04-16 DIAGNOSIS — E782 Mixed hyperlipidemia: Secondary | ICD-10-CM | POA: Diagnosis not present

## 2020-04-16 MED ORDER — LOSARTAN POTASSIUM 50 MG PO TABS: 50.0000 mg | ORAL_TABLET | Freq: Every day | ORAL | 3 refills | Status: DC

## 2020-04-16 NOTE — Progress Notes (Signed)
Patient ID: James Edwards, male   DOB: [birth year], 70 y.o.   MRN: [id number] Patient enrolled for [employer] to ship a 7 day ZIO XT to his home.

## 2020-04-16 NOTE — Patient Instructions (Addendum)
Medication Instructions:  Your physician has recommended you make the following change in your medication:  1.  START Losartan 50 mg taking 1 daily  *If you need a refill on your cardiac medications before your next appointment, please call your pharmacy*   Lab Work: 04/24/2020:  Come to the lab for a BMET (anytime after 7:30)  You will need another BMET prior to your Cardiac CT.  They will call and let you know when it will be scheduled.  If you have labs (blood work) drawn today and your tests are completely normal, you will receive your results only by: Marland Kitchen MyChart Message (if you have MyChart) OR . A paper copy in the mail If you have any lab test that is abnormal or we need to change your treatment, we will call you to review the results.   Testing/Procedures: [place] [place]- Long Term Monitor Instructions   Your physician has requested you wear your ZIO patch monitor 7 days.   This is a single patch monitor.  [employer] supplies one patch monitor per enrollment.  Additional stickers are not available.   Please do not apply patch if you will be having a Nuclear Stress Test, Echocardiogram, Cardiac CT, MRI, or Chest Xray during the time frame you would be wearing the monitor. The patch cannot be worn during these tests.  You cannot remove and re-apply the ZIO XT patch monitor.   Your ZIO patch monitor will be sent USPS Priority mail from [facility] directly to your home address. The monitor may also be mailed to a PO BOX if home delivery is not available.   It may take 3-5 days to receive your monitor after you have been enrolled.   Once you have received you monitor, please review enclosed instructions.  Your monitor has already been registered assigning a specific monitor serial # to you.   Applying the monitor   Shave hair from upper left chest.   Hold abrader disc by orange tab.  Rub abrader in 40 strokes over left upper chest as indicated in your monitor instructions.   Clean  area with 4 enclosed alcohol pads .  Use all pads to assure are is cleaned thoroughly.  Let dry.   Apply patch as indicated in monitor instructions.  Patch will be place under collarbone on left side of chest with arrow pointing upward.   Rub patch adhesive wings for 2 minutes.Remove white label marked "1".  Remove white label marked "2".  Rub patch adhesive wings for 2 additional minutes.   While looking in a mirror, press and release button in center of patch.  A small green light will flash 3-4 times .  This will be your only indicator the monitor has been turned on.     Do not shower for the first 24 hours.  You may shower after the first 24 hours.   Press button if you feel a symptom. You will hear a small click.  Record Date, Time and Symptom in the Patient Log Book.   When you are ready to remove patch, follow instructions on last 2 pages of Patient Log Book.  Stick patch monitor onto last page of Patient Log Book.   Place Patient Log Book in [place] box.  Use locking tab on box and tape box closed securely.  The [employer] and [employer] has [employer] on it.  Please place in mailbox as soon as possible.  Your physician should have your test results approximately 7 days  after the monitor has been mailed back to [place].   Call [place] at [phone] if you have questions regarding your ZIO XT patch monitor.  Call them immediately if you see an orange light blinking on your monitor.   If your monitor falls off in less than 4 days contact our Monitor department at [phone].  If your monitor becomes loose or falls off after 4 days call [employer] at [phone] for suggestions on securing your monitor.     Your physician has requested that you have cardiac CT. Cardiac computed tomography (CT) is a painless test that uses an x-ray machine to take clear, detailed pictures of your heart. For further information please visit [email or web address]. Please follow  instruction sheet as BELOW.  Your cardiac CT will be scheduled at:  [facility] [address] [place], [place] [address] [phone]   If scheduled at [facility], please arrive at the [facility] main entrance (entrance A) of [facility] 30 minutes prior to test start time. Proceed to the [facility] Radiology Department (first floor) to check-in and test prep.  Please follow these instructions carefully (unless otherwise directed):  Hold all erectile dysfunction medications at least 3 days (72 hrs) prior to test.  On the Night Before the Test: . Be sure to Drink plenty of water. . Do not consume any caffeinated/decaffeinated beverages or chocolate 12 hours prior to your test. . Do not take any antihistamines 12 hours prior to your test.   On the Day of the Test: . Drink plenty of water until 1 hour prior to the test. . Do not eat any food 4 hours prior to the test. . You may take your regular medications prior to the test.  Take metoprolol (Lopressor) 4 tablets two hours prior to test.        After the Test: . Drink plenty of water. . After receiving IV contrast, you may experience a mild flushed feeling. This is normal. . On occasion, you may experience a mild rash up to 24 hours after the test. This is not dangerous. If this occurs, you can take Benadryl 25 mg and increase your fluid intake. . If you experience trouble breathing, this can be serious. If it is severe call 911 IMMEDIATELY. If it is mild, please call our office.    Once we have confirmed authorization from your insurance company, we will call you to set up a date and time for your test. Based on how quickly your insurance processes prior authorizations requests, please allow up to 4 weeks to be contacted for scheduling your Cardiac CT appointment. Be advised that routine Cardiac CT appointments could be scheduled as many as 8 weeks after your provider has ordered it.  For  non-scheduling related questions, please contact the cardiac imaging nurse navigator should you have any questions/concerns: Marchia Bond, Cardiac Imaging Nurse Navigator Gordy Clement, Cardiac Imaging Nurse Navigator [facility] Direct Office Dial: [phone]   For scheduling needs, including cancellations and rescheduling, please call [place], [phone].    Follow-Up: At [facility], you and your health needs are our priority.  As part of our continuing mission to provide you with exceptional heart care, we have created designated Provider Care Teams.  These Care Teams include your primary Cardiologist (physician) and Advanced Practice Providers (APPs -  Physician Assistants and Nurse Practitioners) who all work together to provide you with the care you need, when you need it.  We recommend signing up for the patient portal called "MyChart".  Sign up information is provided on this After Visit Summary.  MyChart is used to connect with patients for Virtual Visits (Telemedicine).  Patients are able to view lab/test results, encounter notes, upcoming appointments, etc.  Non-urgent messages can be sent to your provider as well.   To learn more about what you can do with MyChart, go to [email or web address].    Your next appointment:   3 month(s)  The format for your next appointment:   In Person  Provider:   Gwyndolyn Kaufman, MD   You have been referred to EP to discuss Ablation   Other Instructions

## 2020-04-19 ENCOUNTER — Telehealth (HOSPITAL_COMMUNITY): Payer: Self-pay | Admitting: Emergency Medicine

## 2020-04-19 NOTE — Telephone Encounter (Signed)
Attempted to call patient regarding upcoming cardiac CT appointment. °Left message on voicemail with name and callback number °Jeanifer Halliday RN Navigator Cardiac Imaging °[facility] °[phone] Office °[phone] Cell ° °

## 2020-04-20 ENCOUNTER — Telehealth (HOSPITAL_COMMUNITY): Payer: Self-pay | Admitting: Emergency Medicine

## 2020-04-20 NOTE — Telephone Encounter (Signed)
Reaching out to patient to offer assistance regarding upcoming cardiac imaging study; pt verbalizes understanding of appt date/time, parking situation and where to check in, pre-test NPO status and medications ordered, and verified current allergies; name and call back number provided for further questions should they arise Marchia Bond RN Navigator Cardiac Imaging [place] [facility] [phone] office [phone] cell   Pt instructed to take metoprolol 25mg  2 hr prior to scan Clarise Cruz

## 2020-04-23 ENCOUNTER — Ambulatory Visit (HOSPITAL_COMMUNITY)
Admission: RE | Admit: 2020-04-23 | Discharge: 2020-04-23 | Disposition: A | Discharge status: Home / Self Care | Payer: Medicare Other | Source: Ambulatory Visit | Attending: Cardiology | Admitting: Cardiology

## 2020-04-23 ENCOUNTER — Other Ambulatory Visit: Payer: Self-pay

## 2020-04-23 ENCOUNTER — Encounter: Payer: Medicare Other | Admitting: *Deleted

## 2020-04-23 DIAGNOSIS — I700 Atherosclerosis of aorta: Secondary | ICD-10-CM | POA: Insufficient documentation

## 2020-04-23 DIAGNOSIS — J439 Emphysema, unspecified: Secondary | ICD-10-CM | POA: Insufficient documentation

## 2020-04-23 DIAGNOSIS — R931 Abnormal findings on diagnostic imaging of heart and coronary circulation: Secondary | ICD-10-CM | POA: Insufficient documentation

## 2020-04-23 DIAGNOSIS — I2510 Atherosclerotic heart disease of native coronary artery without angina pectoris: Secondary | ICD-10-CM | POA: Insufficient documentation

## 2020-04-23 DIAGNOSIS — K449 Diaphragmatic hernia without obstruction or gangrene: Secondary | ICD-10-CM | POA: Diagnosis not present

## 2020-04-23 DIAGNOSIS — I259 Chronic ischemic heart disease, unspecified: Secondary | ICD-10-CM

## 2020-04-23 DIAGNOSIS — Z006 Encounter for examination for normal comparison and control in clinical research program: Secondary | ICD-10-CM

## 2020-04-23 MED ORDER — IOHEXOL 350 MG/ML SOLN
80.0000 mL | Freq: Once | INTRAVENOUS | Status: AC | PRN
  Administered 2020-04-23: 80 mL via INTRAVENOUS

## 2020-04-23 MED ORDER — NITROGLYCERIN 0.4 MG SL SUBL
SUBLINGUAL_TABLET | SUBLINGUAL | Status: AC
  Administered 2020-04-23: 0.8 mg via SUBLINGUAL
  Filled 2020-04-23: qty 2

## 2020-04-23 MED ORDER — NITROGLYCERIN 0.4 MG SL SUBL: 0.8000 mg | SUBLINGUAL_TABLET | Freq: Once | SUBLINGUAL | Status: AC

## 2020-04-23 NOTE — Research (Signed)
INDENTIFY Informed Consent   Subject Name: James Edwards  Subject met inclusion and exclusion criteria.  The informed consent form, study requirements and expectations were reviewed with the subject and questions and concerns were addressed prior to the signing of the consent form.  The subject verbalized understanding of the trial requirements.  The subject agreed to participate in the IDENTSIFY trial and signed the informed consent at 0655  on 04/23/2020.  The informed consent was obtained prior to performance of any protocol-specific procedures for the subject.  A copy of the signed informed consent was given to the subject and a copy was placed in the subject's medical record.   Philemon Kingdom D

## 2020-04-24 ENCOUNTER — Other Ambulatory Visit: Payer: Medicare Other | Admitting: *Deleted

## 2020-04-24 DIAGNOSIS — I259 Chronic ischemic heart disease, unspecified: Secondary | ICD-10-CM | POA: Diagnosis not present

## 2020-04-24 DIAGNOSIS — I4819 Other persistent atrial fibrillation: Secondary | ICD-10-CM

## 2020-04-24 LAB — BASIC METABOLIC PANEL
BUN/Creatinine Ratio: 14 (ref 10–24)
BUN: 14 mg/dL (ref 8–27)
CO2: 21 mmol/L (ref 20–29)
Calcium: 10.4 mg/dL — ABNORMAL HIGH (ref 8.6–10.2)
Chloride: 103 mmol/L (ref 96–106)
Creatinine, Ser: 0.98 mg/dL (ref 0.76–1.27)
Glucose: 121 mg/dL — ABNORMAL HIGH (ref 65–99)
Potassium: 5 mmol/L (ref 3.5–5.2)
Sodium: 137 mmol/L (ref 134–144)
eGFR: 83 mL/min/{1.73_m2} (ref >59)

## 2020-04-25 ENCOUNTER — Telehealth: Payer: Self-pay

## 2020-04-25 DIAGNOSIS — I259 Chronic ischemic heart disease, unspecified: Secondary | ICD-10-CM | POA: Diagnosis not present

## 2020-04-25 MED ORDER — ROSUVASTATIN CALCIUM 20 MG PO TABS
20.0000 mg | ORAL_TABLET | Freq: Every day | ORAL | 3 refills | Status: DC
Start: 2020-04-25 — End: 2021-04-01

## 2020-04-25 MED ORDER — ASPIRIN EC 81 MG PO TBEC: 81.0000 mg | DELAYED_RELEASE_TABLET | Freq: Every day | ORAL | 3 refills | Status: DC

## 2020-04-25 NOTE — Telephone Encounter (Signed)
RN spoke to patient regarding results and updated him regarding medication changes. Patient in agreement with changes. RN explained to contact the office with any medication side effects or concerns. Patient verbalized understanding.

## 2020-04-25 NOTE — Telephone Encounter (Signed)
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 This plaque is not blocking blood flow. He should take ASA 81mg  daily and can we change his prava to crestor 20mg  daily. If he gets muscle aches with this, let me know and we can change the dose.

## 2020-04-27 ENCOUNTER — Ambulatory Visit (HOSPITAL_COMMUNITY): Payer: Medicare Other

## 2020-05-02 ENCOUNTER — Encounter: Payer: Self-pay | Admitting: Internal Medicine

## 2020-05-02 ENCOUNTER — Other Ambulatory Visit: Payer: Self-pay

## 2020-05-02 ENCOUNTER — Ambulatory Visit (INDEPENDENT_AMBULATORY_CARE_PROVIDER_SITE_OTHER): Payer: Medicare Other | Admitting: Internal Medicine

## 2020-05-02 VITALS — BP 130/74 | HR 85 | Ht 70.0 in | Wt 192.8 lb

## 2020-05-02 DIAGNOSIS — I4819 Other persistent atrial fibrillation: Secondary | ICD-10-CM | POA: Diagnosis not present

## 2020-05-02 DIAGNOSIS — I10 Essential (primary) hypertension: Secondary | ICD-10-CM | POA: Diagnosis not present

## 2020-05-02 NOTE — Progress Notes (Signed)
Electrophysiology Office Note   Date:  05/02/2020   ID:  James Edwards, DOB 1951-01-12, MRN [id number]  PCP:  Dorothyann Peng, NP  Cardiologist:  Dr Johney Frame Primary Electrophysiologist: Thompson Grayer, MD    CC: afib   History of Present Illness: James Edwards is a 70 y.o. male who presents today for electrophysiology evaluation.   He is referred by Dr Johney Frame for EP consultation regarding afib.  He has DM, HTN, and persistent atrial fibrillation.  He was initially diagnosed with afib 03/27/20 after presenting to PCP for reports of AF on his apple watch (which started 01/30/20).  He was started on metoprolol and xarelto. He has fatigue and decreased exercise tolerance with Afib. He thinks that he is having afib daily according to his apple watch. He drinks ETOH modestly.  Today, he denies symptoms of palpitations, chest pain,  orthopnea, PND, lower extremity edema, claudication, dizziness, presyncope, syncope, bleeding, or neurologic sequela. The patient is tolerating medications without difficulties and is otherwise without complaint today.    Past Medical History:  Diagnosis Date  . Allergy   . Arthritis   . Diabetes mellitus without complication ([place]) dx 65-03-54   type 2 diet controlled (pt reports being told pre-diabetic)  . GERD (gastroesophageal reflux disease)   . Hypertension   . Paroxysmal atrial fibrillation (HCC)   . Prostate cancer [facility]) 2013   active observation   Past Surgical History:  Procedure Laterality Date  . COLONOSCOPY    . colonscopy     x 2  . ELBOW SURGERY Right 2007  . POLYPECTOMY    . PROSTATE BIOPSY     x 3  . PROSTATE BIOPSY N/A 01/21/2016   Procedure: BIOPSY TRANSRECTAL ULTRASONIC PROSTATE (TUBP);  Surgeon: Raynelle Bring, MD;  Location: [facility];  Service: Urology;  Laterality: N/A;  . PROSTATE BIOPSY N/A 07/15/2018   Procedure: NEEDLE BIOPSY TRANSRECTAL ULTRASONIC PROSTATE (TUBP);  Surgeon: Raynelle Bring, MD;  Location: [facility];   Service: Urology;  Laterality: N/A;     Current Outpatient Medications  Medication Sig Dispense Refill  . aspirin EC 81 MG tablet Take 1 tablet (81 mg total) by mouth daily. Swallow whole. 90 tablet 3  . losartan (COZAAR) 50 MG tablet Take 1 tablet (50 mg total) by mouth daily. 90 tablet 3  . metoprolol tartrate (LOPRESSOR) 25 MG tablet Take 1 tablet (25 mg total) by mouth 2 (two) times daily. 180 tablet 3  . Multiple Vitamin (MULTIVITAMIN) tablet Take 1 tablet by mouth daily.    Marland Kitchen OVER THE COUNTER MEDICATION Gin Raisin for joint health    . oxybutynin (DITROPAN-XL) 10 MG 24 hr tablet Take 10 mg by mouth every evening.     . rivaroxaban (XARELTO) 20 MG TABS tablet Take 1 tablet (20 mg total) by mouth daily with supper. 30 tablet 3  . rosuvastatin (CRESTOR) 20 MG tablet Take 1 tablet (20 mg total) by mouth daily. 90 tablet 3  . traZODone (DESYREL) 50 MG tablet TAKE 1 TABLET (50 MG TOTAL) BY MOUTH AT BEDTIME AS NEEDED. 90 tablet 1  . TURMERIC PO Take by mouth.     No current facility-administered medications for this visit.    Allergies:   Codeine   Social History:  The patient  reports that he quit smoking about 16 years ago. His smoking use included cigarettes. He has a 50.00 pack-year smoking history. He has never used smokeless tobacco. He reports current alcohol use of about 6.0 -  8.0 standard drinks of alcohol per week. He reports that he does not use drugs.   Family History:  The patient's  family history includes Arthritis in his father; Bladder Cancer in his father and mother; Cancer in his father; Colon polyps in his father; Dementia in his brother; Glaucoma in his mother; Heart disease in his father and mother; Hypertension in his father and mother.    ROS:  Please see the history of present illness.   All other systems are personally reviewed and negative.    PHYSICAL EXAM: VS:  BP 130/74   Pulse 85   Ht 5\' 10"  (1.778 m)   Wt 192 lb 12.8 oz (87.5 kg)   SpO2 97%   BMI  27.66 kg/m  , BMI Body mass index is 27.66 kg/m. GEN: Well nourished, well developed, in no acute distress HEENT: normal Neck: no JVD, carotid bruits, or masses Cardiac: iRRR; no murmurs, rubs, or gallops,no edema  Respiratory:  clear to auscultation bilaterally, normal work of breathing GI: soft, nontender, nondistended, + BS MS: no deformity or atrophy Skin: warm and dry  Neuro:  Strength and sensation are intact Psych: euthymic mood, full affect  EKG:  EKG is ordered today. The ekg ordered today is personally reviewed and shows afib, V rate 85 bpm   Recent Labs: 05/24/2019: ALT 14; Hemoglobin 13.7; Platelets 238.0 03/27/2020: TSH 1.03 04/24/2020: BUN 14; Creatinine, Ser 0.98; Potassium 5.0; Sodium 137  personally reviewed   Lipid Panel     Component Value Date/Time   CHOL 211 (H) 05/24/2019 0819   TRIG 97.0 05/24/2019 0819   HDL 66.30 05/24/2019 0819   CHOLHDL 3 05/24/2019 0819   VLDL 19.4 05/24/2019 0819   LDLCALC 125 (H) 05/24/2019 0819   LDLDIRECT 97.0 01/03/2016 0821   personally reviewed   Wt Readings from Last 3 Encounters:  05/02/20 192 lb 12.8 oz (87.5 kg)  04/16/20 196 lb 9.6 oz (89.2 kg)  04/12/20 198 lb 9.6 oz (90.1 kg)    Echo 04/13/20- EF 55%, LA 5.2 cm in size  Other studies personally reviewed: Additional studies/ records that were reviewed today include: Dr Holland [facility] notes  Review of the above records today demonstrates: as above   ASSESSMENT AND PLAN:  1.  afib The patient has symptomatic, recurrent atrial fibrillation.  I suspect that his afib is actually persistent.  He wore a Zio recently which is not currently available to review.  Chads2vasc score is at least 2.  he is anticoagulated with xarelto. Therapeutic strategies for afib including medicine (flecainide, multaq, tikosyn) and ablation were discussed in detail with the patient today. Risk, benefits, and alternatives to each approach were discussed.  He has severe LA enlargement (5.2cm) by  echo which reduces my enthusiasm for rhythm control when coupled with heavy ETOH and minimal symptoms.  We discussed rate control as an option today as well.  He states "I am going on a cruise in June and am not planning to do anything prior to this".    2. ETOH Cessation advised  3. HTN Stable No change required today    Follow-up:  AF clinic when he returns from his cruise in June and also with Dr Johney Frame as scheduled.   I will see as needed.    Army Fossa, MD  05/02/2020 9:30 AM     [facility] [address] [place] [address] [phone] (office) [phone] (fax)

## 2020-05-02 NOTE — H&P (View-Only) (Signed)
Electrophysiology Office Note   Date:  05/02/2020   ID:  James Edwards, DOB [birth year], MRN [id number]  PCP:  Dorothyann Peng, NP  Cardiologist:  Dr Johney Frame Primary Electrophysiologist: Thompson Grayer, MD    CC: afib   History of Present Illness: James Edwards is a 70 y.o. male who presents today for electrophysiology evaluation.   He is referred by Dr Johney Frame for EP consultation regarding afib.  He has DM, HTN, and persistent atrial fibrillation.  He was initially diagnosed with afib 03/27/20 after presenting to PCP for reports of AF on his apple watch (which started 01/30/20).  He was started on metoprolol and xarelto. He has fatigue and decreased exercise tolerance with Afib. He thinks that he is having afib daily according to his apple watch. He drinks ETOH modestly.  Today, he denies symptoms of palpitations, chest pain,  orthopnea, PND, lower extremity edema, claudication, dizziness, presyncope, syncope, bleeding, or neurologic sequela. The patient is tolerating medications without difficulties and is otherwise without complaint today.    Past Medical History:  Diagnosis Date  . Allergy   . Arthritis   . Diabetes mellitus without complication ([place]) dx 26-94-85   type 2 diet controlled (pt reports being told pre-diabetic)  . GERD (gastroesophageal reflux disease)   . Hypertension   . Paroxysmal atrial fibrillation (HCC)   . Prostate cancer [facility]) 2013   active observation   Past Surgical History:  Procedure Laterality Date  . COLONOSCOPY    . colonscopy     x 2  . ELBOW SURGERY Right 2007  . POLYPECTOMY    . PROSTATE BIOPSY     x 3  . PROSTATE BIOPSY N/A 01/21/2016   Procedure: BIOPSY TRANSRECTAL ULTRASONIC PROSTATE (TUBP);  Surgeon: Raynelle Bring, MD;  [facility];  Service: Urology;  Laterality: N/A;  . PROSTATE BIOPSY N/A 07/15/2018   Procedure: NEEDLE BIOPSY TRANSRECTAL ULTRASONIC PROSTATE (TUBP);  Surgeon: Raynelle Bring, MD;  [facility];   Service: Urology;  Laterality: N/A;     Current Outpatient Medications  Medication Sig Dispense Refill  . aspirin EC 81 MG tablet Take 1 tablet (81 mg total) by mouth daily. Swallow whole. 90 tablet 3  . losartan (COZAAR) 50 MG tablet Take 1 tablet (50 mg total) by mouth daily. 90 tablet 3  . metoprolol tartrate (LOPRESSOR) 25 MG tablet Take 1 tablet (25 mg total) by mouth 2 (two) times daily. 180 tablet 3  . Multiple Vitamin (MULTIVITAMIN) tablet Take 1 tablet by mouth daily.    Marland Kitchen OVER THE COUNTER MEDICATION Gin Raisin for joint health    . oxybutynin (DITROPAN-XL) 10 MG 24 hr tablet Take 10 mg by mouth every evening.     . rivaroxaban (XARELTO) 20 MG TABS tablet Take 1 tablet (20 mg total) by mouth daily with supper. 30 tablet 3  . rosuvastatin (CRESTOR) 20 MG tablet Take 1 tablet (20 mg total) by mouth daily. 90 tablet 3  . traZODone (DESYREL) 50 MG tablet TAKE 1 TABLET (50 MG TOTAL) BY MOUTH AT BEDTIME AS NEEDED. 90 tablet 1  . TURMERIC PO Take by mouth.     No current facility-administered medications for this visit.    Allergies:   Codeine   Social History:  The patient  reports that he quit smoking about 16 years ago. His smoking use included cigarettes. He has a 50.00 pack-year smoking history. He has never used smokeless tobacco. He reports current alcohol use of about 6.0 -  8.0 standard drinks of alcohol per week. He reports that he does not use drugs.   Family History:  The patient's  family history includes Arthritis in his father; Bladder Cancer in his father and mother; Cancer in his father; Colon polyps in his father; Dementia in his brother; Glaucoma in his mother; Heart disease in his father and mother; Hypertension in his father and mother.    ROS:  Please see the history of present illness.   All other systems are personally reviewed and negative.    PHYSICAL EXAM: VS:  BP 130/74   Pulse 85   Ht 5\' 10"  (1.778 m)   Wt 192 lb 12.8 oz (87.5 kg)   SpO2 97%   BMI  27.66 kg/m  , BMI Body mass index is 27.66 kg/m. GEN: Well nourished, well developed, in no acute distress HEENT: normal Neck: no JVD, carotid bruits, or masses Cardiac: iRRR; no murmurs, rubs, or gallops,no edema  Respiratory:  clear to auscultation bilaterally, normal work of breathing GI: soft, nontender, nondistended, + BS MS: no deformity or atrophy Skin: warm and dry  Neuro:  Strength and sensation are intact Psych: euthymic mood, full affect  EKG:  EKG is ordered today. The ekg ordered today is personally reviewed and shows afib, V rate 85 bpm   Recent Labs: 05/24/2019: ALT 14; Hemoglobin 13.7; Platelets 238.0 03/27/2020: TSH 1.03 04/24/2020: BUN 14; Creatinine, Ser 0.98; Potassium 5.0; Sodium 137  personally reviewed   Lipid Panel     Component Value Date/Time   CHOL 211 (H) 05/24/2019 0819   TRIG 97.0 05/24/2019 0819   HDL 66.30 05/24/2019 0819   CHOLHDL 3 05/24/2019 0819   VLDL 19.4 05/24/2019 0819   LDLCALC 125 (H) 05/24/2019 0819   LDLDIRECT 97.0 01/03/2016 0821   personally reviewed   Wt Readings from Last 3 Encounters:  05/02/20 192 lb 12.8 oz (87.5 kg)  04/16/20 196 lb 9.6 oz (89.2 kg)  04/12/20 198 lb 9.6 oz (90.1 kg)    Echo 04/13/20- EF 55%, LA 5.2 cm in size  Other studies personally reviewed: Additional studies/ records that were reviewed today include: Dr Holland [facility] notes  Review of the above records today demonstrates: as above   ASSESSMENT AND PLAN:  1.  afib The patient has symptomatic, recurrent atrial fibrillation.  I suspect that his afib is actually persistent.  He wore a Zio recently which is not currently available to review.  Chads2vasc score is at least 2.  he is anticoagulated with xarelto. Therapeutic strategies for afib including medicine (flecainide, multaq, tikosyn) and ablation were discussed in detail with the patient today. Risk, benefits, and alternatives to each approach were discussed.  He has severe LA enlargement (5.2cm) by  echo which reduces my enthusiasm for rhythm control when coupled with heavy ETOH and minimal symptoms.  We discussed rate control as an option today as well.  He states "I am going on a cruise in June and am not planning to do anything prior to this".    2. ETOH Cessation advised  3. HTN Stable No change required today    Follow-up:  AF clinic when he returns from his cruise in June and also with Dr Johney Frame as scheduled.   I will see as needed.    Army Fossa, MD  05/02/2020 9:30 AM     [facility] [address] [place] [address] [phone] (office) [phone] (fax)

## 2020-05-02 NOTE — Patient Instructions (Addendum)
Medication Instructions:  Stop Aspirin  Your physician recommends that you continue on your current medications as directed. Please refer to the Current Medication list given to you today.  Labwork: None ordered.  Testing/Procedures: None ordered.  Follow-Up:  Your physician wants you to follow-up in: Afib Clinic in 3 months. They will call you to schedule.    You will receive a reminder letter in the mail two months in advance. If you don't receive a letter, please call our office to schedule the follow-up appointment.  Any Other Special Instructions Will Be Listed Below (If Applicable).  If you need a refill on your cardiac medications before your next appointment, please call your pharmacy.

## 2020-05-03 ENCOUNTER — Encounter (HOSPITAL_COMMUNITY): Payer: Self-pay

## 2020-05-03 ENCOUNTER — Ambulatory Visit (HOSPITAL_COMMUNITY): Payer: Medicare Other | Admitting: Physician Assistant

## 2020-05-04 ENCOUNTER — Ambulatory Visit (HOSPITAL_COMMUNITY): Payer: Medicare Other | Admitting: Physician Assistant

## 2020-05-04 DIAGNOSIS — Z85820 Personal history of malignant melanoma of skin: Secondary | ICD-10-CM | POA: Diagnosis not present

## 2020-05-04 DIAGNOSIS — Z85828 Personal history of other malignant neoplasm of skin: Secondary | ICD-10-CM | POA: Diagnosis not present

## 2020-05-04 DIAGNOSIS — C4359 Malignant melanoma of other part of trunk: Secondary | ICD-10-CM | POA: Diagnosis not present

## 2020-05-04 DIAGNOSIS — L570 Actinic keratosis: Secondary | ICD-10-CM | POA: Diagnosis not present

## 2020-05-04 DIAGNOSIS — L578 Other skin changes due to chronic exposure to nonionizing radiation: Secondary | ICD-10-CM | POA: Diagnosis not present

## 2020-05-04 DIAGNOSIS — C44519 Basal cell carcinoma of skin of other part of trunk: Secondary | ICD-10-CM | POA: Diagnosis not present

## 2020-05-04 DIAGNOSIS — C44619 Basal cell carcinoma of skin of left upper limb, including shoulder: Secondary | ICD-10-CM | POA: Diagnosis not present

## 2020-05-04 DIAGNOSIS — D1801 Hemangioma of skin and subcutaneous tissue: Secondary | ICD-10-CM | POA: Diagnosis not present

## 2020-05-08 DIAGNOSIS — I4819 Other persistent atrial fibrillation: Secondary | ICD-10-CM | POA: Diagnosis not present

## 2020-05-08 DIAGNOSIS — I259 Chronic ischemic heart disease, unspecified: Secondary | ICD-10-CM | POA: Diagnosis not present

## 2020-05-14 ENCOUNTER — Telehealth: Payer: Self-pay

## 2020-05-14 DIAGNOSIS — R079 Chest pain, unspecified: Secondary | ICD-10-CM

## 2020-05-14 MED ORDER — NITROGLYCERIN 0.4 MG SL SUBL: 0.4000 mg | SUBLINGUAL_TABLET | SUBLINGUAL | 3 refills | Status: AC | PRN

## 2020-05-14 NOTE — Telephone Encounter (Signed)
Called and spoke to the patient about his chest pain and the plan for coronary angiography. He understands and is willing to proceed. Risks and benefits of the procedure discussed with the patient as detailed below.  INFORMED CONSENT: I have reviewed the risks, indications, and alternatives to cardiac catheterization, possible angioplasty, and stenting with the patient. Risks include but are not limited to bleeding, infection, vascular injury, stroke, myocardial infection, arrhythmia, kidney injury, radiation-related injury in the case of prolonged fluoroscopy use, emergency cardiac surgery, and death. The patient understands the risks of serious complication is 1-2 in 4920 with diagnostic cardiac cath and 1-2% or less with angioplasty/stenting.   Gwyndolyn Kaufman, MD

## 2020-05-14 NOTE — Telephone Encounter (Signed)
Returned call to Pt.  At this time his chest pain has resolved.  Per Dr. Johney Frame- Will send nitro to pharmacy and schedule cath.  Pt advised and in agreement.  Pt scheduled for cardiac cath 05/18/20 at 7:30 am with Dr. Martinique.  Will get labs/covid screen 05/16/20.

## 2020-05-14 NOTE — Telephone Encounter (Signed)
Work up complete and instructions sent via [employer].

## 2020-05-16 ENCOUNTER — Other Ambulatory Visit: Payer: Self-pay

## 2020-05-16 ENCOUNTER — Other Ambulatory Visit (HOSPITAL_COMMUNITY)
Admission: RE | Admit: 2020-05-16 | Discharge: 2020-05-16 | Disposition: A | Discharge status: Home / Self Care | Payer: Medicare Other | Source: Ambulatory Visit | Attending: Cardiology | Admitting: Cardiology

## 2020-05-16 ENCOUNTER — Other Ambulatory Visit: Payer: Medicare Other | Admitting: *Deleted

## 2020-05-16 DIAGNOSIS — I4819 Other persistent atrial fibrillation: Secondary | ICD-10-CM

## 2020-05-16 DIAGNOSIS — I259 Chronic ischemic heart disease, unspecified: Secondary | ICD-10-CM | POA: Diagnosis not present

## 2020-05-16 DIAGNOSIS — R079 Chest pain, unspecified: Secondary | ICD-10-CM | POA: Diagnosis not present

## 2020-05-16 DIAGNOSIS — Z20822 Contact with and (suspected) exposure to covid-19: Secondary | ICD-10-CM | POA: Insufficient documentation

## 2020-05-16 DIAGNOSIS — Z01812 Encounter for preprocedural laboratory examination: Secondary | ICD-10-CM | POA: Insufficient documentation

## 2020-05-16 LAB — BASIC METABOLIC PANEL
BUN/Creatinine Ratio: 13 (ref 10–24)
BUN: 11 mg/dL (ref 8–27)
CO2: 27 mmol/L (ref 20–29)
Calcium: 10.1 mg/dL (ref 8.6–10.2)
Chloride: 105 mmol/L (ref 96–106)
Creatinine, Ser: 0.87 mg/dL (ref 0.76–1.27)
Glucose: 136 mg/dL — ABNORMAL HIGH (ref 65–99)
Potassium: 4.6 mmol/L (ref 3.5–5.2)
Sodium: 140 mmol/L (ref 134–144)
eGFR: 93 mL/min/{1.73_m2} (ref >59)

## 2020-05-16 LAB — CBC WITH DIFFERENTIAL/PLATELET
Basophils Absolute: 0 10*3/uL (ref 0.0–0.2)
Basos: 0 %
EOS (ABSOLUTE): 0.4 10*3/uL (ref 0.0–0.4)
Eos: 6 %
Hematocrit: 40.3 % (ref 37.5–51.0)
Hemoglobin: 13.5 g/dL (ref 13.0–17.7)
Lymphocytes Absolute: 1.8 10*3/uL (ref 0.7–3.1)
Lymphs: 26 %
MCH: 32.1 pg (ref 26.6–33.0)
MCHC: 33.5 g/dL (ref 31.5–35.7)
MCV: 96 fL (ref 79–97)
Monocytes Absolute: 0.8 10*3/uL (ref 0.1–0.9)
Monocytes: 11 %
Neutrophils Absolute: 4 10*3/uL (ref 1.4–7.0)
Neutrophils: 57 %
Platelets: 220 10*3/uL (ref 150–450)
RBC: 4.21 x10E6/uL (ref 4.14–5.80)
RDW: 12.5 % (ref 11.6–15.4)
WBC: 7 10*3/uL (ref 3.4–10.8)

## 2020-05-16 LAB — SARS CORONAVIRUS 2 (TAT 6-24 HRS): SARS Coronavirus 2: NEGATIVE

## 2020-05-17 ENCOUNTER — Telehealth: Payer: Self-pay | Admitting: *Deleted

## 2020-05-17 NOTE — Telephone Encounter (Signed)
Pt contacted pre-catheterization scheduled at [facility] for: Friday May 18, 2020 7:30 AM Verified arrival time and place: [facility]) at: 5:30 AM   No solid food after midnight prior to cath, clear liquids until 5 AM day of procedure.  Hold: Xarelto-none 05/16/20 until post procedure  Except hold medications AM meds can be  taken pre-cath with sips of water including: ASA 81 mg   Confirmed patient has responsible adult to drive home post procedure and be with patient first 24 hours after arriving home: yes  You are allowed ONE visitor in the waiting room during the time you are at the hospital for your procedure. Both you and your visitor must wear a mask once you enter the hospital.   Reviewed procedure/mask/visitor instructions with patient.

## 2020-05-18 ENCOUNTER — Encounter (HOSPITAL_COMMUNITY)
Admission: RE | Disposition: A | Discharge status: Home / Self Care | Payer: Self-pay | Source: Home / Self Care | Attending: Cardiology

## 2020-05-18 ENCOUNTER — Other Ambulatory Visit: Payer: Self-pay

## 2020-05-18 ENCOUNTER — Ambulatory Visit (HOSPITAL_COMMUNITY)
Admission: RE | Admit: 2020-05-18 | Discharge: 2020-05-18 | Disposition: A | Discharge status: Home / Self Care | Payer: Medicare Other | Attending: Cardiology | Admitting: Cardiology

## 2020-05-18 ENCOUNTER — Encounter (HOSPITAL_COMMUNITY): Payer: Self-pay | Admitting: Cardiology

## 2020-05-18 DIAGNOSIS — Z7289 Other problems related to lifestyle: Secondary | ICD-10-CM | POA: Insufficient documentation

## 2020-05-18 DIAGNOSIS — E119 Type 2 diabetes mellitus without complications: Secondary | ICD-10-CM | POA: Diagnosis not present

## 2020-05-18 DIAGNOSIS — Z79899 Other long term (current) drug therapy: Secondary | ICD-10-CM | POA: Diagnosis not present

## 2020-05-18 DIAGNOSIS — I4819 Other persistent atrial fibrillation: Secondary | ICD-10-CM | POA: Diagnosis not present

## 2020-05-18 DIAGNOSIS — Z7982 Long term (current) use of aspirin: Secondary | ICD-10-CM | POA: Insufficient documentation

## 2020-05-18 DIAGNOSIS — R072 Precordial pain: Secondary | ICD-10-CM | POA: Diagnosis present

## 2020-05-18 DIAGNOSIS — I10 Essential (primary) hypertension: Secondary | ICD-10-CM | POA: Diagnosis not present

## 2020-05-18 DIAGNOSIS — I2510 Atherosclerotic heart disease of native coronary artery without angina pectoris: Secondary | ICD-10-CM | POA: Diagnosis not present

## 2020-05-18 DIAGNOSIS — Z7901 Long term (current) use of anticoagulants: Secondary | ICD-10-CM | POA: Insufficient documentation

## 2020-05-18 DIAGNOSIS — E785 Hyperlipidemia, unspecified: Secondary | ICD-10-CM | POA: Diagnosis present

## 2020-05-18 DIAGNOSIS — Z87891 Personal history of nicotine dependence: Secondary | ICD-10-CM | POA: Diagnosis not present

## 2020-05-18 DIAGNOSIS — Z885 Allergy status to narcotic agent status: Secondary | ICD-10-CM | POA: Diagnosis not present

## 2020-05-18 HISTORY — PX: LEFT HEART CATH AND CORONARY ANGIOGRAPHY: CATH118249

## 2020-05-18 SURGERY — LEFT HEART CATH AND CORONARY ANGIOGRAPHY
Anesthesia: LOCAL

## 2020-05-18 MED ORDER — SODIUM CHLORIDE 0.9 % IV SOLN: 250.0000 mL | INTRAVENOUS | Status: DC | PRN

## 2020-05-18 MED ORDER — HEPARIN (PORCINE) IN NACL 1000-0.9 UT/500ML-% IV SOLN
INTRAVENOUS | Status: DC | PRN
  Administered 2020-05-18 (×2): 500 mL

## 2020-05-18 MED ORDER — SODIUM CHLORIDE 0.9% FLUSH: 3.0000 mL | INTRAVENOUS | Status: DC | PRN

## 2020-05-18 MED ORDER — MIDAZOLAM HCL 2 MG/2ML IJ SOLN
INTRAMUSCULAR | Status: AC
  Filled 2020-05-18: qty 2

## 2020-05-18 MED ORDER — SODIUM CHLORIDE 0.9% FLUSH: 3.0000 mL | Freq: Two times a day (BID) | INTRAVENOUS | Status: DC

## 2020-05-18 MED ORDER — ONDANSETRON HCL 4 MG/2ML IJ SOLN: 4.0000 mg | Freq: Four times a day (QID) | INTRAMUSCULAR | Status: DC | PRN

## 2020-05-18 MED ORDER — IOHEXOL 350 MG/ML SOLN
INTRAVENOUS | Status: DC | PRN
  Administered 2020-05-18: 60 mL via INTRA_ARTERIAL

## 2020-05-18 MED ORDER — HEPARIN (PORCINE) IN NACL 1000-0.9 UT/500ML-% IV SOLN
INTRAVENOUS | Status: AC
  Filled 2020-05-18: qty 1000

## 2020-05-18 MED ORDER — HEPARIN SODIUM (PORCINE) 1000 UNIT/ML IJ SOLN
INTRAMUSCULAR | Status: DC | PRN
  Administered 2020-05-18: 4500 [IU] via INTRAVENOUS

## 2020-05-18 MED ORDER — VERAPAMIL HCL 2.5 MG/ML IV SOLN
INTRAVENOUS | Status: DC | PRN
  Administered 2020-05-18: 10 mL via INTRA_ARTERIAL

## 2020-05-18 MED ORDER — MIDAZOLAM HCL 2 MG/2ML IJ SOLN
INTRAMUSCULAR | Status: DC | PRN
  Administered 2020-05-18 (×2): 1 mg via INTRAVENOUS

## 2020-05-18 MED ORDER — LIDOCAINE HCL (PF) 1 % IJ SOLN
INTRAMUSCULAR | Status: DC | PRN
  Administered 2020-05-18: 2 mL

## 2020-05-18 MED ORDER — LIDOCAINE HCL (PF) 1 % IJ SOLN
INTRAMUSCULAR | Status: AC
  Filled 2020-05-18: qty 30

## 2020-05-18 MED ORDER — SODIUM CHLORIDE 0.9 % WEIGHT BASED INFUSION: 1.0000 mL/kg/h | INTRAVENOUS | Status: DC

## 2020-05-18 MED ORDER — FENTANYL CITRATE (PF) 100 MCG/2ML IJ SOLN
INTRAMUSCULAR | Status: DC | PRN
  Administered 2020-05-18 (×2): 25 ug via INTRAVENOUS

## 2020-05-18 MED ORDER — VERAPAMIL HCL 2.5 MG/ML IV SOLN
INTRAVENOUS | Status: AC
  Filled 2020-05-18: qty 2

## 2020-05-18 MED ORDER — HEPARIN SODIUM (PORCINE) 1000 UNIT/ML IJ SOLN
INTRAMUSCULAR | Status: AC
  Filled 2020-05-18: qty 1

## 2020-05-18 MED ORDER — FENTANYL CITRATE (PF) 100 MCG/2ML IJ SOLN
INTRAMUSCULAR | Status: AC
  Filled 2020-05-18: qty 2

## 2020-05-18 MED ORDER — SODIUM CHLORIDE 0.9 % WEIGHT BASED INFUSION
3.0000 mL/kg/h | INTRAVENOUS | Status: AC
  Administered 2020-05-18: 3 mL/kg/h via INTRAVENOUS

## 2020-05-18 MED ORDER — ACETAMINOPHEN 325 MG PO TABS: 650.0000 mg | ORAL_TABLET | ORAL | Status: DC | PRN

## 2020-05-18 MED ORDER — ASPIRIN 81 MG PO CHEW: 81.0000 mg | CHEWABLE_TABLET | ORAL | Status: AC

## 2020-05-18 SURGICAL SUPPLY — 10 items
CATH 5FR JL3.5 JR4 ANG PIG MP (CATHETERS) ×2 IMPLANT
DEVICE RAD COMP TR BAND LRG (VASCULAR PRODUCTS) ×2 IMPLANT
GLIDESHEATH SLEND SS 6F .021 (SHEATH) ×2 IMPLANT
GUIDEWIRE INQWIRE 1.5J.035X260 (WIRE) ×1 IMPLANT
INQWIRE 1.5J .035X260CM (WIRE) ×2
KIT HEART LEFT (KITS) ×2 IMPLANT
PACK CARDIAC CATHETERIZATION (CUSTOM PROCEDURE TRAY) ×2 IMPLANT
SHEATH PROBE COVER 6X72 (BAG) ×2 IMPLANT
TRANSDUCER W/STOPCOCK (MISCELLANEOUS) ×2 IMPLANT
TUBING CIL FLEX 10 FLL-RA (TUBING) ×2 IMPLANT

## 2020-05-18 NOTE — Discharge Instructions (Signed)
Radial Site Care  This sheet gives you information about how to care for yourself after your procedure. Your health care provider may also give you more specific instructions. If you have problems or questions, contact your health care provider. What can I expect after the procedure? After the procedure, it is common to have:  Bruising and tenderness at the catheter insertion area. Follow these instructions at home: Medicines  Take over-the-counter and prescription medicines only as told by your health care provider. Insertion site care  Follow instructions from your health care provider about how to take care of your insertion site. Make sure you: ? Wash your hands with soap and water before you change your bandage (dressing). If soap and water are not available, use hand sanitizer. ? Change your dressing as told by your health care provider. ? Leave stitches (sutures), skin glue, or adhesive strips in place. These skin closures may need to stay in place for 2 weeks or longer. If adhesive strip edges start to loosen and curl up, you may trim the loose edges. Do not remove adhesive strips completely unless your health care provider tells you to do that.  Check your insertion site every day for signs of infection. Check for: ? Redness, swelling, or pain. ? Fluid or blood. ? Pus or a bad smell. ? Warmth.  Do not take baths, swim, or use a hot tub until your health care provider approves.  You may shower 24-48 hours after the procedure, or as directed by your health care provider. ? Remove the dressing and gently wash the site with plain soap and water. ? Pat the area dry with a clean towel. ? Do not rub the site. That could cause bleeding.  Do not apply powder or lotion to the site. Activity  For 24 hours after the procedure, or as directed by your health care provider: ? Do not flex or bend the affected arm. ? Do not push or pull heavy objects with the affected arm. ? Do not drive  yourself home from the hospital or clinic. You may drive 24 hours after the procedure unless your health care provider tells you not to. ? Do not operate machinery or power tools.  Do not lift anything that is heavier than 10 lb (4.5 kg), or the limit that you are told, until your health care provider says that it is safe.  Ask your health care provider when it is okay to: ? Return to work or school. ? Resume usual physical activities or sports. ? Resume sexual activity.   General instructions  If the catheter site starts to bleed, raise your arm and put firm pressure on the site. If the bleeding does not stop, get help right away. This is a medical emergency.  If you went home on the same day as your procedure, a responsible adult should be with you for the first 24 hours after you arrive home.  Keep all follow-up visits as told by your health care provider. This is important. Contact a health care provider if:  You have a fever.  You have redness, swelling, or yellow drainage around your insertion site. Get help right away if:  You have unusual pain at the radial site.  The catheter insertion area swells very fast.  The insertion area is bleeding, and the bleeding does not stop when you hold steady pressure on the area.  Your arm or hand becomes pale, cool, tingly, or numb. These symptoms may represent a serious   problem that is an emergency. Do not wait to see if the symptoms will go away. Get medical help right away. Call your local emergency services (911 in the [place] Do not drive yourself to the hospital. Summary  After the procedure, it is common to have bruising and tenderness at the site.  Follow instructions from your health care provider about how to take care of your radial site wound. Check the wound every day for signs of infection.  Do not lift anything that is heavier than 10 lb (4.5 kg), or the limit that you are told, until your health care provider says that it  is safe. This information is not intended to replace advice given to you by your health care provider. Make sure you discuss any questions you have with your health care provider. Document Revised: 03/11/2017 Document Reviewed: 03/11/2017 Elsevier Patient Education  2021 Elsevier Inc.  

## 2020-05-18 NOTE — Interval H&P Note (Signed)
History and Physical Interval Note:  05/18/2020 7:17 AM  James Edwards  has presented today for surgery, with the diagnosis of chest pain.  The various methods of treatment have been discussed with the patient and family. After consideration of risks, benefits and other options for treatment, the patient has consented to  Procedure(s): LEFT HEART CATH AND CORONARY ANGIOGRAPHY (N/A) as a surgical intervention.  The patient's history has been reviewed, patient examined, no change in status, stable for surgery.  I have reviewed the patient's chart and labs.  Questions were answered to the patient's satisfaction.   Cath Lab Visit (complete for each Cath Lab visit)  Clinical Evaluation Leading to the Procedure:   ACS: No.  Non-ACS:    Anginal Classification: CCS III  Anti-ischemic medical therapy: Minimal Therapy (1 class of medications)  Non-Invasive Test Results: Low-risk stress test findings: cardiac mortality <1%/year  Prior CABG: No previous CABG        [facility] 05/18/2020 7:18 AM

## 2020-05-24 ENCOUNTER — Telehealth: Payer: Self-pay | Admitting: Pharmacist

## 2020-05-24 NOTE — Chronic Care Management (AMB) (Signed)
Chronic Care Management Pharmacy Assistant   Name: Fordyce Lepak  MRN: [id number] DOB: 08-Jun-1950  Reason for Encounter: Medication Review   Conditions to be addressed/monitored: HTN and HLD  Recent office visits:  . 02.17.2022- Nurse Visit- BP Check  Recent consult visits:  . 03.28.2022- Phone Call-Cardiology o Nitroglycerin 0.4 mg Sublingual Every 5 min PRN . 03.16.2022 Thompson Grayer, MD Cardiology o Aspirin 81 mg daily discontinued . 03.09.2022 - Phone Call- Cardiology o Aspirin 81 mg Oral Daily, Swallow whole. o Rosuvastatin Calcium 20 mg Oral Daily o Pravastatin Sodium 20 mg Oral Daily (Discontinued) . 02.28.2022 Fenton, Doloris Hall, PA Cardiology o Losartan Potassium 50 mg Oral Daily (Started) . 02.24.2022 Oliver Barre, PA Cardiology . 02.18.2022 Oliver Barre, PA Cardiology  Hospital visits:  Medication Reconciliation was completed by comparing discharge summary, patient's EMR and Pharmacy list, and upon discussion with patient. Admitted to the hospital on 05/18/2020 due to Left heart Cath and Coronary Angiograhy. Discharge date was 05/18/2020. Discharged from [facility]?Medications Started at [facility] Discharge:?? -None  Medication Changes at Hospital Discharge: -None  Medications Discontinued at Hospital Discharge: -None  Medications that remain the same after Hospital Discharge:??  -All other medications will remain the same.    Medications: Outpatient Encounter Medications as of 05/24/2020  Medication Sig Note  . acetaminophen (TYLENOL) 500 MG tablet Take 1,000 mg by mouth every 6 (six) hours as needed for moderate pain.   Marland Kitchen losartan (COZAAR) 50 MG tablet Take 1 tablet (50 mg total) by mouth daily.   . metoprolol tartrate (LOPRESSOR) 25 MG tablet Take 1 tablet (25 mg total) by mouth 2 (two) times daily.   . Multiple Vitamin (MULTIVITAMIN) tablet Take 1 tablet by mouth daily.   . nitroGLYCERIN (NITROSTAT) 0.4 MG SL tablet  Place 1 tablet (0.4 mg total) under the tongue every 5 (five) minutes as needed for chest pain. 05/14/2020: Has not picked up yet  . OVER THE COUNTER MEDICATION Take 1 tablet by mouth daily. Gin Raisin for joint health   . oxybutynin (DITROPAN-XL) 10 MG 24 hr tablet Take 10 mg by mouth every evening.    . rivaroxaban (XARELTO) 20 MG TABS tablet Take 1 tablet (20 mg total) by mouth daily with supper.   . rosuvastatin (CRESTOR) 20 MG tablet Take 1 tablet (20 mg total) by mouth daily.   . traZODone (DESYREL) 50 MG tablet TAKE 1 TABLET (50 MG TOTAL) BY MOUTH AT BEDTIME AS NEEDED. (Patient taking differently: Take 50 mg by mouth at bedtime as needed for sleep.)   . TURMERIC PO Take 1 capsule by mouth daily.    No facility-administered encounter medications on file as of 05/24/2020.   Reviewed chart prior to disease state call. Spoke with patient regarding BP  Recent Office Vitals: BP Readings from Last 3 Encounters:  05/18/20 (!) 145/90  05/02/20 130/74  04/23/20 139/75   Pulse Readings from Last 3 Encounters:  05/18/20 72  05/02/20 85  04/23/20 63    Wt Readings from Last 3 Encounters:  05/18/20 192 lb (87.1 kg)  05/02/20 192 lb 12.8 oz (87.5 kg)  04/16/20 196 lb 9.6 oz (89.2 kg)     Kidney Function Lab Results  Component Value Date/Time   CREATININE 0.87 05/16/2020 08:03 AM   CREATININE 0.98 04/24/2020 11:12 AM   GFR 73.59 03/27/2020 02:37 PM   GFRNONAA >60 07/09/2018 09:44 AM   GFRAA >60 07/09/2018 09:44 AM  BMP Latest Ref Rng & Units 05/16/2020 04/24/2020 03/27/2020  Glucose 65 - 99 mg/dL 136(H) 121(H) 148(H)  BUN 8 - 27 mg/dL 11 14 17   Creatinine 0.76 - 1.27 mg/dL 0.87 0.98 1.03  BUN/Creat Ratio 10 - 24 13 14  -  Sodium 134 - 144 mmol/L 140 137 137  Potassium 3.5 - 5.2 mmol/L 4.6 5.0 4.7  Chloride 96 - 106 mmol/L 105 103 104  CO2 20 - 29 mmol/L 27 21 28   Calcium 8.6 - 10.2 mg/dL 10.1 10.4(H) 10.3   . Current antihypertensive regimen:  o Metoprolol tartrate 25 mg 1 tablet  twice daily. o Losartan 50 mg tablet daily o Nitroglycerin 0.4 mg: one tablet under tongue every 5 minutes as needed for chest pain . How often are you checking your Blood Pressure? daily . Current home BP readings:  o 04.07 140/80 o 04.08 142/80 o 04.09 138/78 o 04.10 140/82 . What recent interventions/DTPs have been made by any provider to improve Blood Pressure control since last CPP Visit:  o Started Losartan 50 mg and Nitroglycerin 0.4 mg . Any recent hospitalizations or ED visits since last visit with CPP? Yes . What diet changes have been made to improve Blood Pressure Control?  o He has decreased his alcohol intake . What exercise is being done to improve your Blood Pressure Control?  o He continues to walk daily  Adherence Review: Is the patient currently on ACE/ARB medication? Yes Does the patient have >5 day gap between last estimated fill dates? No  06/03/2020 Name: Rayane Gallardo MRN: [id number] DOB: [birth year] Duane Lope Tyris Eliot is a 70 y.o. year old male who is a primary care patient of Dorothyann Peng, NP.  Comprehensive medication review performed; Spoke to patient regarding cholesterol  Lipid Panel    Component Value Date/Time   CHOL 211 (H) 05/24/2019 0819   TRIG 97.0 05/24/2019 0819   HDL 66.30 05/24/2019 0819   LDLCALC 125 (H) 05/24/2019 0819   LDLDIRECT 97.0 01/03/2016 0821    10-year ASCVD risk score: The 10-year ASCVD risk score Mikey Bussing DC Brooke Bonito., et al., 2013) is: 39.9%   Values used to calculate the score:     Age: 70 years     Sex: Male     Is Non-Hispanic African American: No     Diabetic: Yes     Tobacco smoker: No     Systolic Blood Pressure: 491 mmHg     Is BP treated: Yes     HDL Cholesterol: 66.3 mg/dL     Total Cholesterol: 211 mg/dL  . Current antihyperlipidemic regimen:  o Rosuvastatin 20 mg daily . Previous antihyperlipidemic medications tried: Pravastatin and Atorvastatin . ASCVD risk enhancing conditions: HTN . What recent  interventions/DTPs have been made by any provider to improve Cholesterol control since last CPP Visit:  o Pravastatin was discontinued o Started Rosuvastatin 20 mg . Any recent hospitalizations or ED visits since last visit with CPP? Yes . What diet changes have been made to improve Cholesterol?  o He's trying to eat less fried foods and drink more water daily. . What exercise is being done to improve Cholesterol?  o He continues to walk daily  Adherence Review: Does the patient have >5 day gap between last estimated fill dates? No  I spoke with the patient and discussed medication adherence. He has had a few changes since his last appointment. He has started Losartan 50 mg daily and nitroglycerin 0.4 mg as needed by his cardiologist.  He also received a left heart catheterization and coronary angiography on April 1st. There was also a change in his cholesterol medication. He has discontinued Pravastatin and started Rosuvastatin 20 mg daily. He states that he has greatly decreased his alcohol intake and continues to walk as much as he can daily. There have been no other changes to his medications. He denies any side effects from his current medications. Since his last hospital visit, there have been no others. He denies any problems with his current pharmacy.  Star Rating Drugs:  Dispensed Quantity Pharmacy  Losartan 50 mg 02.28.2022 [facility]  Rosuvastatin 20 mg 03.09.2022 [facility]   [place] (Revonda Standard, [place] [phone]

## 2020-05-25 ENCOUNTER — Telehealth: Payer: Self-pay | Admitting: Cardiology

## 2020-05-25 ENCOUNTER — Other Ambulatory Visit: Payer: Self-pay | Admitting: Urology

## 2020-05-25 DIAGNOSIS — C61 Malignant neoplasm of prostate: Secondary | ICD-10-CM

## 2020-05-25 NOTE — Telephone Encounter (Signed)
   [facility] Pre-operative Risk Assessment      HEARTCARE STAFF: - Please ensure there is not already an duplicate clearance open for this procedure. - Under Visit Info/Reason for Call, type in Other and utilize the format Clearance MM/DD/YY or Clearance TBD. Do not use dashes or single digits. - If request is for dental extraction, please clarify the # of teeth to be extracted.  Request for surgical clearance:  1. What type of surgery is being performed? A transrectal ultrasound guided prostate biopsy   2. When is this surgery scheduled? 08/13/2020  3. What type of clearance is required (medical clearance vs. Pharmacy clearance to hold med vs. Both)? Both   4. Are there any medications that need to be held prior to surgery and how long? Yes Xarelto and needs to be held 3 days prior.  5. Practice name and name of physician performing surgery? [facility] and Dr. Raynelle Bring   6. What is the office phone number? [phone]   7.   What is the office fax number? [phone]  8.   Anesthesia type (None, local, MAC, general) ? MAC   Lars Pinks 05/25/2020, 2:47 PM  _________________________________________________________________   (provider comments below)

## 2020-05-25 NOTE — Telephone Encounter (Signed)
Clinical pharmacist to review Xarelto 

## 2020-05-25 NOTE — Telephone Encounter (Signed)
   Patient Name: James Edwards  DOB: Jul 01, 1950  MRN: [id number]   Primary Cardiologist: Freada Bergeron, MD  Chart reviewed as part of pre-operative protocol coverage. Given past medical history and time since last visit, based on ACC/AHA guidelines, James Edwards would be at acceptable risk for the planned procedure without further cardiovascular testing.  Recent cardiac catheterization showed mild nonobstructive CAD.  The patient was advised that if he develops new symptoms prior to surgery to contact our office to arrange for a follow-up visit, and he verbalized understanding.  I will route this recommendation to the requesting party via Epic fax function and remove from pre-op pool.  Please call with questions.  [place], [place] 05/25/2020, 3:06 PM

## 2020-05-25 NOTE — Telephone Encounter (Signed)
Patient with diagnosis of afib on Xarelto for anticoagulation.    Procedure: A transrectal ultrasound guided prostate biopsy   Date of procedure: 08/13/20  CHA2DS2-VASc Score = 4  This indicates a 4.8% annual risk of stroke. The patient's score is based upon: CHF History: No HTN History: Yes Diabetes History: Yes Stroke History: No Vascular Disease History: Yes (elevated calcium score 04/23/20) Age Score: 1 Gender Score: 0  CrCl 64mL/min Platelet count 220K  Per office protocol, patient can hold Xarelto for 3 days prior to procedure as requested.

## 2020-06-06 DIAGNOSIS — D0359 Melanoma in situ of other part of trunk: Secondary | ICD-10-CM | POA: Diagnosis not present

## 2020-06-06 DIAGNOSIS — C4359 Malignant melanoma of other part of trunk: Secondary | ICD-10-CM | POA: Diagnosis not present

## 2020-06-06 DIAGNOSIS — Z85820 Personal history of malignant melanoma of skin: Secondary | ICD-10-CM | POA: Diagnosis not present

## 2020-06-06 DIAGNOSIS — Z85828 Personal history of other malignant neoplasm of skin: Secondary | ICD-10-CM | POA: Diagnosis not present

## 2020-06-14 ENCOUNTER — Other Ambulatory Visit: Payer: Self-pay | Admitting: Urology

## 2020-06-14 ENCOUNTER — Other Ambulatory Visit (HOSPITAL_COMMUNITY): Payer: Self-pay | Admitting: Urology

## 2020-06-14 DIAGNOSIS — C61 Malignant neoplasm of prostate: Secondary | ICD-10-CM

## 2020-06-17 DIAGNOSIS — Z85820 Personal history of malignant melanoma of skin: Secondary | ICD-10-CM

## 2020-06-17 DIAGNOSIS — Z9889 Other specified postprocedural states: Secondary | ICD-10-CM

## 2020-06-17 HISTORY — DX: Personal history of malignant melanoma of skin: Z85.820

## 2020-06-17 HISTORY — DX: Other specified postprocedural states: Z98.890

## 2020-07-02 ENCOUNTER — Other Ambulatory Visit: Payer: Self-pay | Admitting: Adult Health

## 2020-07-02 DIAGNOSIS — G4700 Insomnia, unspecified: Secondary | ICD-10-CM

## 2020-07-15 NOTE — Progress Notes (Signed)
Cardiology Office Note:    Date:  07/19/2020   ID:  [name], DOB Nov 13, 1950, MRN [id number]  PCP:  Dorothyann Peng, NP   [facility]  Cardiologist:  Freada Bergeron, MD  Advanced Practice Provider:  No care team member to display Electrophysiologist:  Thompson Grayer, MD   Referring MD: Dorothyann Peng, NP    History of Present Illness:    James Edwards is a 70 y.o. male with a hx of  DMII, HTN, persistent Afib, prostate cancer and GERD who presents to clinic for CV follow-up.  The patient was initially diagnosed with atrial fibrillation 03/27/20 after presenting to his PCP office. Patient reports his Apple Watch alerted him that he was in afib on 01/30/20 but did not have any other alerts until 03/2020. ECG at his PCP office confirmed afib. He was started on metoprolol for rate control and Xarelto for a CHADS2VASC score of 2. TTE was obtained which showed LVEF 55% with inferior basal hypokinesis, moderate LAE, mild/mod aortic sclerosis without stenosis.  Was last seen in clinic on 04/16/20 where he was doing well. We obtained CTA on 04/23/20 for inferobasal hypokinesis noted on TTE, which showed mild LAD disease with Ca score 225 (58% for age-sex matched control).  FFR with normal prox and mid. Distal 0.78 but was smaller caliber. Was recommended for medical management.  He subsequently saw Dr. Rayann Heman in clinic on 05/02/20 where he was recommended for rate control of his Afib due to severe LA enlargement. He then presented to the ER in 05/2020 with chest pain. He underwent LHC which revealed mild, nonobstructive disease.  Today, the patient states that he continues to feel intermittently fatigued with his Afib. This is most notable at night when he is sitting and resting. Does not know his HR at that time. No palpitations, lightheadedness or dizziness. Has taken nitro for intermittent chest discomfort with only mild relief. Denies any LE edema, orthopnea, or  PND.   Past Medical History:  Diagnosis Date  . Allergy   . Arthritis   . Diabetes mellitus without complication ([place]) dx [id number]   type 2 diet controlled (pt reports being told pre-diabetic)  . GERD (gastroesophageal reflux disease)   . Hypertension   . Paroxysmal atrial fibrillation (HCC)   . Prostate cancer [facility]) 2013   active observation    Past Surgical History:  Procedure Laterality Date  . COLONOSCOPY    . colonscopy     x 2  . ELBOW SURGERY Right 2007  . LEFT HEART CATH AND CORONARY ANGIOGRAPHY N/A 05/18/2020   Procedure: LEFT HEART CATH AND CORONARY ANGIOGRAPHY;  Surgeon: Martinique, Peter M, MD;  Location: [facility];  Service: Cardiovascular;  Laterality: N/A;  . POLYPECTOMY    . PROSTATE BIOPSY     x 3  . PROSTATE BIOPSY N/A 01/21/2016   Procedure: BIOPSY TRANSRECTAL ULTRASONIC PROSTATE (TUBP);  Surgeon: Raynelle Bring, MD;  Location: [facility];  Service: Urology;  Laterality: N/A;  . PROSTATE BIOPSY N/A 07/15/2018   Procedure: NEEDLE BIOPSY TRANSRECTAL ULTRASONIC PROSTATE (TUBP);  Surgeon: Raynelle Bring, MD;  Location: [facility];  Service: Urology;  Laterality: N/A;    Current Medications: Current Meds  Medication Sig  . acetaminophen (TYLENOL) 500 MG tablet Take 1,000 mg by mouth every 6 (six) hours as needed for moderate pain.  . metoprolol succinate (TOPROL XL) 25 MG 24 hr tablet Take 1 tablet (25 mg total) by mouth daily.  Marland Kitchen  Multiple Vitamin (MULTIVITAMIN) tablet Take 1 tablet by mouth daily.  . nitroGLYCERIN (NITROSTAT) 0.4 MG SL tablet Place 1 tablet (0.4 mg total) under the tongue every 5 (five) minutes as needed for chest pain.  Marland Kitchen OVER THE COUNTER MEDICATION Take 1 tablet by mouth daily. Gin Raisin for joint health  . oxybutynin (DITROPAN-XL) 10 MG 24 hr tablet Take 10 mg by mouth every evening.   . rivaroxaban (XARELTO) 20 MG TABS tablet Take 1 tablet (20 mg total) by mouth daily with supper.  . rosuvastatin (CRESTOR) 20 MG tablet Take 1 tablet (20 mg  total) by mouth daily.  . traZODone (DESYREL) 50 MG tablet TAKE 1 TABLET (50 MG TOTAL) BY MOUTH AT BEDTIME AS NEEDED.  Marland Kitchen TURMERIC PO Take 1 capsule by mouth daily.  . [DISCONTINUED] metoprolol tartrate (LOPRESSOR) 25 MG tablet Take 1 tablet (25 mg total) by mouth 2 (two) times daily.     Allergies:   Codeine   Social History   Socioeconomic History  . Marital status: Married    Spouse name: Not on file  . Number of children: 1  . Years of education: Not on file  . Highest education level: Not on file  Occupational History  . Not on file  Tobacco Use  . Smoking status: Former Smoker    Packs/day: 2.00    Years: 25.00    Pack years: 50.00    Types: Cigarettes    Quit date: 02/18/2004    Years since quitting: 16.4  . Smokeless tobacco: Never Used  . Tobacco comment: per pt stopped in 2006   Vaping Use  . Vaping Use: Former  . Quit date: 02/18/2008  Substance and Sexual Activity  . Alcohol use: Yes    Alcohol/week: 6.0 - 8.0 standard drinks    Types: 6 - 8 Cans of beer per week    Comment: 6pk/ per day  . Drug use: No  . Sexual activity: Not Currently  Other Topics Concern  . Not on file  Social History Narrative   Lives in [place] with wife.   Retired.  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    Two Children who both live locally.       He likes to sleep.    Social Determinants of Health   Financial Resource Strain: Low Risk   . Difficulty of Paying Living Expenses: Not hard at all  Food Insecurity: No Food Insecurity  . Worried About Charity fundraiser in the Last Year: Never true  . Ran Out of Food in the Last Year: Never true  Transportation Needs: No Transportation Needs  . Lack of Transportation (Medical): No  . Lack of Transportation (Non-Medical): No  Physical Activity: Sufficiently Active  . Days of Exercise per Week: 7 days  . Minutes of Exercise per Session: 60 min  Stress: No Stress Concern Present  .  Feeling of Stress : Not at all  Social Connections: Moderately Isolated  . Frequency of Communication with Friends and Family: More than three times a week  . Frequency of Social Gatherings with Friends and Family: Twice a week  . Attends Religious Services: Never  . Active Member of Clubs or Organizations: No  . Attends Archivist Meetings: Never  . Marital Status: Married     Family History: The patient's family history includes Arthritis in his father; Bladder Cancer in his father and mother; Cancer in his father;  Colon polyps in his father; Dementia in his brother; Glaucoma in his mother; Heart disease in his father and mother; Hypertension in his father and mother. There is no history of Colon cancer, Esophageal cancer, Rectal cancer, Stomach cancer, Breast cancer, or Prostate cancer.  ROS:   Please see the history of present illness.    Review of Systems  Constitutional: Positive for malaise/fatigue. Negative for chills and fever.  HENT: Negative for hearing loss.   Eyes: Negative for blurred vision.  Respiratory: Negative for shortness of breath.   Cardiovascular: Negative for chest pain, palpitations, orthopnea, claudication, leg swelling and PND.  Gastrointestinal: Negative for blood in stool, melena and nausea.  Genitourinary: Negative for hematuria.  Musculoskeletal: Negative for falls.  Neurological: Negative for dizziness and loss of consciousness.  Endo/Heme/Allergies: Negative for polydipsia.  Psychiatric/Behavioral: Negative for substance abuse.    EKGs/Labs/Other Studies Reviewed:    The following studies were reviewed today: LHC 2020/06/11:   Prox LAD lesion is 20% stenosed.  Dist Cx lesion is 30% stenosed.  The left ventricular systolic function is normal.  LV end diastolic pressure is normal.  The left ventricular ejection fraction is 55-65% by visual estimate.   1. Minor nonobstructive CAD 2. Normal LV function 3. Normal LVEDP  Plan: risk  factor modification.    Coronary CTA 04/23/20: FINDINGS: A 140 kV prospective scan was triggered in the descending thoracic aorta at 111 HU's. Axial non-contrast 3 mm slices were carried out through the heart. The data set was analyzed on a dedicated work station and scored using the [place]. Gantry rotation speed was 250 msecs and collimation was .6 mm. Beta blockade and 0.8 mg of sl NTG was given. The 3D data set was reconstructed in 5% intervals of the 67-82 % of the R-R cycle. Diastolic phases were analyzed on a dedicated work station using MPR, MIP and VRT modes. The patient received 80 cc of contrast.  Aorta:  Normal size.  Aortic atherosclerosis.  No dissection.  Aortic Valve:  Trileaflet.  No calcifications.  Coronary Arteries:  Normal coronary origin.  Right dominance.  RCA is a large dominant artery that gives rise to PDA and PLA. There is no plaque.  Left main is a large artery that gives rise to LAD and LCX arteries.  LAD is a large vessel that has proximal and mid plaque (at diagonal bifurcation), 25-49% stenosis. Will check FFR for validation.  LCX is a non-dominant artery that gives rise to one large OM1 branch. There is mild focal calcified plaque, 0-24% stenosis in the mid AV groove segment.  Other findings:  Normal pulmonary vein drainage into the left atrium.  Normal left atrial appendage without a thrombus.  Normal size of the pulmonary artery.  Please see radiology report for non cardiac findings.  IMPRESSION: 1. Coronary calcium score of 224. This was 89 percentile for age and sex matched control.  2. Normal coronary origin with right dominance.  3. Proximal LAD and mid LAD calcified plaque of 25-49%. Sending for FFR for validation. Small focal circumflex calcified plaque 0-24%.  FINDINGS: FFRct analysis was performed on the original cardiac CT angiogram dataset. Diagrammatic representation of the FFRct analysis  is provided in a separate PDF document in PACS. This dictation was created using the PDF document and an interactive 3D model of the results. 3D model is not available in the EMR/PACS. Normal FFR range is >0.80.  1. Left Main: Normal  2. LAD: Normal prox and mid. Distal 0.78 however  this is smaller caliber vessel.  3. LCX: Normal  4. Ramus: N/A  5. RCA: Normal  Cardiac Monitor 05/08/20:  Patch wear time was 6 days and 10 hours.  There was 100% burden of atrial fibrillation with HR ranging from 56-172 bpm (average of 95bpm)  There was 1 run of VT lasting 6 beats at HR 207bpm  Rare PVCs    Patch Wear Time:  6 days and 10 hours (2022-03-08T19:46:41-0500 to 2022-03-15T07:40:13-0400)  1 run of Ventricular Tachycardia occurred lasting 6 beats with a max rate of 207 bpm (avg 192 bpm). Atrial Fibrillation occurred continuously (100% burden), ranging from 56-172 bpm (avg of 95 bpm). Isolated VEs were rare (<1.0%, 3688), VE Couplets were  rare (<1.0%, 215), and VE Triplets were rare (<1.0%, 4). Ventricular Trigeminy was present.    TTE 04/15/20: IMPRESSIONS  1. Inferior basal hypokinesis . Left ventricular ejection fraction, by  estimation, is 55%. The left ventricle has normal function. The left  ventricle has no regional wall motion abnormalities. Left ventricular  diastolic parameters are indeterminate.  2. Right ventricular systolic function is normal. The right ventricular  size is normal. There is normal pulmonary artery systolic pressure.  3. Left atrial size was moderately dilated.  4. The mitral valve is abnormal. Trivial mitral valve regurgitation. No  evidence of mitral stenosis.  5. The aortic valve is tricuspid. There is moderate calcification of the  aortic valve. Aortic valve regurgitation is not visualized. Mild to  moderate aortic valve sclerosis/calcification is present, without any  evidence of aortic stenosis.  6. The inferior vena cava is normal  in size with greater than 50%  respiratory variability, suggesting right atrial pressure of 3 mmHg.   Abdominal aortogram 03/2018:    Abdominal Aorta Findings:  +-----------+-------+----------+----------+--------+--------+--------+  Location  AP (cm)Trans (cm)PSV (cm/s)WaveformThrombusComments  +-----------+-------+----------+----------+--------+--------+--------+  Proximal  1.75  1.72   102                  +-----------+-------+----------+----------+--------+--------+--------+  Mid    2.22  2.00   113                  +-----------+-------+----------+----------+--------+--------+--------+  Distal   1.71  1.75   122                  +-----------+-------+----------+----------+--------+--------+--------+  RT CIA Prox0.9  0.9    407                  +-----------+-------+----------+----------+--------+--------+--------+  LT CIA Prox0.9  1.1    451                  +-----------+-------+----------+----------+--------+--------+--------+      Summary:  Abdominal Aorta: No evidence of an abdominal aortic aneurysm was  visualized. The largest aortic measurement is 2.2 cm.  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  +------------------+-------------+  Left Common Iliac >50% stenosis  +------------------+-------------+    Recent Labs: 03/27/2020: TSH 1.03 05/16/2020: BUN 11; Creatinine, Ser 0.87; Hemoglobin 13.5; Platelets 220; Potassium 4.6; Sodium 140  Recent Lipid Panel    Component Value Date/Time   CHOL 211 (H) 05/24/2019 0819   TRIG 97.0 05/24/2019 0819   HDL 66.30 05/24/2019 0819   CHOLHDL 3 05/24/2019 0819   VLDL 19.4 05/24/2019 0819   LDLCALC 125 (H) 05/24/2019 0819   LDLDIRECT 97.0 01/03/2016 0821     Risk  Assessment/Calculations:    CHA2DS2-VASc Score = 4  This indicates a 4.8% annual risk of stroke. The patient's score is based upon: CHF History: No HTN History: Yes Diabetes History: Yes Stroke History: No Vascular Disease History: Yes (elevated calcium score 04/23/20) Age Score: 1 Gender Score: 0    Physical Exam:    VS:  BP 134/82   Pulse 76   Ht 5\' 10"  (1.778 m)   Wt 192 lb 9.6 oz (87.4 kg)   SpO2 98%   BMI 27.64 kg/m     Wt Readings from Last 3 Encounters:  07/19/20 192 lb 9.6 oz (87.4 kg)  05/18/20 192 lb (87.1 kg)  05/02/20 192 lb 12.8 oz (87.5 kg)     GEN:  Comfortable, NAD HEENT: Normal NECK: No JVD; No carotid bruits CARDIAC: Irregularly irregular, no murmurs, rubs, gallops RESPIRATORY:  Clear to auscultation without rales, wheezing or rhonchi  ABDOMEN: Soft, non-tender, non-distended MUSCULOSKELETAL:  No edema; No deformity  SKIN: Warm and dry NEUROLOGIC:  Alert and oriented x 3 PSYCHIATRIC:  Normal affect   ASSESSMENT:    1. Persistent atrial fibrillation ([place])   2. Essential hypertension   3. Mixed hyperlipidemia   4. Mild coronary artery disease    PLAN:    In order of problems listed above:  #Mild Nonobstructive CAD: Patient with 20% prox LAD and 30% distal LCx stenosis on [facility] 05/18/20. On medical management.  -No ASA as patient on xarelto -Continue crestor 20mg  daily -Start metop succinate 25mg  XL; will change tartrate to as needed for palpitations -Continue losartan 50mg  daily -Nitro prn (although suspect chest pain more likely to be related to RVR vs angina; discussed this with him today and he will check HR at time of discomfort and if HR >100, he will take metop tartrate over the nitro to see if that helps)  #Persistent Afib: CHADs-vasc 2. Followed by Afib clinic. Unfortunately, not a great candidate for rhythm control due to severe LAE.  -Continue xarelto 20mg  daily -Start metop succinate 25mg  XL as suspect symptoms are worse when the  tartrate wears off -Change metop tartrate to 25mg  TID prn for HR>100  #HTN: Well controlled. -Metop succinate 25mg  XL daily as above -Continue losartan 50mg  daily  #HLD: -Continue Crestor 20mg  daily -Did not tolerate lipitor     Medication Adjustments/Labs and Tests Ordered: Current medicines are reviewed at length with the patient today.  Concerns regarding medicines are outlined above.  No orders of the defined types were placed in this encounter.  Meds ordered this encounter  Medications  . metoprolol succinate (TOPROL XL) 25 MG 24 hr tablet    Sig: Take 1 tablet (25 mg total) by mouth daily.    Dispense:  90 tablet    Refill:  3  . metoprolol tartrate (LOPRESSOR) 25 MG tablet    Sig: Take 1 tablet (25 mg total) by mouth 3 (three) times daily as needed (for break through heart rate greater than 100.).    Dispense:  90 tablet    Refill:  2    Patient Instructions  Medication Instructions:  Change Metoprolol tartrate to 25 mg 3 times a day, as needed. For break through palpitations. Start Metoprolol Succinate 25 mg daily *If you need a refill on your cardiac medications before your next appointment, please call your pharmacy*   Lab Work: none If you have labs (blood work) drawn today and your tests are completely normal, you will receive your results only by: Marland Kitchen MyChart Message (if you have MyChart) OR . A paper copy in  the mail If you have any lab test that is abnormal or we need to change your treatment, we will call you to review the results.   Testing/Procedures: none   Follow-Up: At [facility], you and your health needs are our priority.  As part of our continuing mission to provide you with exceptional heart care, we have created designated Provider Care Teams.  These Care Teams include your primary Cardiologist (physician) and Advanced Practice Providers (APPs -  Physician Assistants and Nurse Practitioners) who all work together to provide you with the  care you need, when you need it.  We recommend signing up for the patient portal called "MyChart".  Sign up information is provided on this After Visit Summary.  MyChart is used to connect with patients for Virtual Visits (Telemedicine).  Patients are able to view lab/test results, encounter notes, upcoming appointments, etc.  Non-urgent messages can be sent to your provider as well.   To learn more about what you can do with MyChart, go to [email or web address].    Your next appointment:   6 month(s)  The format for your next appointment:   In Person  Provider:   Gwyndolyn Kaufman, MD   Other Instructions none    Signed, Freada Bergeron, MD  07/19/2020 11:07 AM    [place]

## 2020-07-18 DIAGNOSIS — Z20822 Contact with and (suspected) exposure to covid-19: Secondary | ICD-10-CM | POA: Diagnosis not present

## 2020-07-19 ENCOUNTER — Encounter: Payer: Self-pay | Admitting: Cardiology

## 2020-07-19 ENCOUNTER — Other Ambulatory Visit: Payer: Self-pay

## 2020-07-19 ENCOUNTER — Ambulatory Visit (INDEPENDENT_AMBULATORY_CARE_PROVIDER_SITE_OTHER): Payer: Medicare Other | Admitting: Cardiology

## 2020-07-19 VITALS — BP 134/82 | HR 76 | Ht 70.0 in | Wt 192.6 lb

## 2020-07-19 DIAGNOSIS — E782 Mixed hyperlipidemia: Secondary | ICD-10-CM

## 2020-07-19 DIAGNOSIS — I10 Essential (primary) hypertension: Secondary | ICD-10-CM | POA: Diagnosis not present

## 2020-07-19 DIAGNOSIS — I4819 Other persistent atrial fibrillation: Secondary | ICD-10-CM | POA: Diagnosis not present

## 2020-07-19 DIAGNOSIS — I2510 Atherosclerotic heart disease of native coronary artery without angina pectoris: Secondary | ICD-10-CM | POA: Diagnosis not present

## 2020-07-19 MED ORDER — METOPROLOL SUCCINATE ER 25 MG PO TB24: 25.0000 mg | ORAL_TABLET | Freq: Every day | ORAL | 3 refills | Status: DC

## 2020-07-19 MED ORDER — METOPROLOL TARTRATE 25 MG PO TABS
25.0000 mg | ORAL_TABLET | Freq: Three times a day (TID) | ORAL | 2 refills | Status: DC | PRN
Start: 2020-07-19 — End: 2021-03-14

## 2020-07-19 NOTE — Patient Instructions (Signed)
Medication Instructions:  Change Metoprolol tartrate to 25 mg 3 times a day, as needed. For break through palpitations. Start Metoprolol Succinate 25 mg daily *If you need a refill on your cardiac medications before your next appointment, please call your pharmacy*   Lab Work: none If you have labs (blood work) drawn today and your tests are completely normal, you will receive your results only by: Marland Kitchen MyChart Message (if you have MyChart) OR . A paper copy in the mail If you have any lab test that is abnormal or we need to change your treatment, we will call you to review the results.   Testing/Procedures: none   Follow-Up: At [facility], you and your health needs are our priority.  As part of our continuing mission to provide you with exceptional heart care, we have created designated Provider Care Teams.  These Care Teams include your primary Cardiologist (physician) and Advanced Practice Providers (APPs -  Physician Assistants and Nurse Practitioners) who all work together to provide you with the care you need, when you need it.  We recommend signing up for the patient portal called "MyChart".  Sign up information is provided on this After Visit Summary.  MyChart is used to connect with patients for Virtual Visits (Telemedicine).  Patients are able to view lab/test results, encounter notes, upcoming appointments, etc.  Non-urgent messages can be sent to your provider as well.   To learn more about what you can do with MyChart, go to [email or web address].    Your next appointment:   6 month(s)  The format for your next appointment:   In Person  Provider:   Gwyndolyn Kaufman, MD   Other Instructions none

## 2020-07-20 ENCOUNTER — Institutional Professional Consult (permissible substitution): Payer: Medicare Other | Admitting: Pulmonary Disease

## 2020-07-25 ENCOUNTER — Ambulatory Visit: Payer: Medicare Other | Attending: Internal Medicine

## 2020-07-25 DIAGNOSIS — Z20822 Contact with and (suspected) exposure to covid-19: Secondary | ICD-10-CM

## 2020-07-26 LAB — SARS-COV-2, NAA 2 DAY TAT

## 2020-07-26 LAB — NOVEL CORONAVIRUS, NAA: SARS-CoV-2, NAA: NOT DETECTED

## 2020-07-30 ENCOUNTER — Telehealth: Payer: Medicare Other

## 2020-08-08 ENCOUNTER — Other Ambulatory Visit: Payer: Self-pay

## 2020-08-08 ENCOUNTER — Encounter (HOSPITAL_BASED_OUTPATIENT_CLINIC_OR_DEPARTMENT_OTHER): Payer: Self-pay | Admitting: Urology

## 2020-08-08 NOTE — Progress Notes (Signed)
Spoke w/ via phone for pre-op interview--- Pt Lab needs dos----  BMP             Lab results------ current ekg in epic/ chart COVID test --- 08-09-2020 @ 0830  (pt traveled out of the country, returned 08-04-2020)  Arrive at -------  1245 on 08-13-2020 NPO after MN NO Solid Food.  Clear liquids from MN until--- 1145 Med rec completed Medications to take morning of surgery ----- Crestor,  and if needed for heart rate >100 take metoprolol as prescribed Diabetic medication ----- n/a  Patient instructed no nail polish to be worn day of surgery Patient instructed to bring photo id and insurance card day of surgery Patient aware to have Driver (ride ) / caregiver  for 24 hours after surgery -- wife, [facility] Patient Special Instructions ----- n/a Pre-Op special Istructions ----- pt has telephone cardiac clearance by Almyra Deforest PA dated 05-25-2020 in epic/ chart  Patient verbalized understanding of instructions that were given at this phone interview. Patient denies shortness of breath, chest pain, fever, cough at this phone interview.   Anesthesia Review: HTN:  Persistant AFib on xarelto and bb;  Pt denies chest pain, sob, and no peripheral swelling.  Pt stated last taken a nitro approx 08-05-2020 due to heart rate >100 even after taking extra lopressor as prescribed.  PCP:  Dr Carlisle Cater  (lov 04/ 2021 epic) Cardiologist : Dr Shary Key (lov 07-19-2020 epic) All results in epic Chest x-ray : no EKG : 05-02-2020  CTA:  04-23-2020/ FFR Echo : 04-15-2020 Evet monitor:  05-08-2020 Stress test:  no Cardiac Cath :  05-18-2020 Activity level: denies sob w/ any activity Sleep Study/ CPAP : pt is scheduled for study 08-21-2020 as work-up for AFib Fasting Blood Sugar :      / Checks Blood Sugar -- times a day:  does not chek Blood Thinner/ Instructions Maryjane Hurter Dose: Xarelto ASA / Instructions/ Last Dose :   ASA Per pt was given instructions by cardiology to stop 3 days prior to surgery, last dose  will be 08-09-2020.

## 2020-08-09 ENCOUNTER — Other Ambulatory Visit (HOSPITAL_COMMUNITY)
Admission: RE | Admit: 2020-08-09 | Discharge: 2020-08-09 | Disposition: A | Discharge status: Home / Self Care | Payer: Medicare Other | Source: Ambulatory Visit | Attending: Urology | Admitting: Urology

## 2020-08-09 DIAGNOSIS — Z01812 Encounter for preprocedural laboratory examination: Secondary | ICD-10-CM | POA: Diagnosis not present

## 2020-08-09 DIAGNOSIS — Z20822 Contact with and (suspected) exposure to covid-19: Secondary | ICD-10-CM | POA: Diagnosis not present

## 2020-08-09 LAB — SARS CORONAVIRUS 2 (TAT 6-24 HRS): SARS Coronavirus 2: NEGATIVE

## 2020-08-10 NOTE — H&P (Signed)
CC/HPI: 1. Prostate cancer  2. BPH/LUTS   [name] returns today for continued surveillance of his favorable intermediate risk prostate cancer. This was noted be upgraded in May of 2020 an we have discussed proceeding with further evaluation with a surveillance biopsy. We did postpone this 6 months ago due to other issues he had going on in his life. He has noted a change in his voiding habits and feels that this is a little more bothersome. Specifically, he feels that oxybutynin ER 10 mg is no longer as effective. It has been helpful but he is hoping to improve his urgency and frequency symptoms. He has previously tried VESIcare which was helpful but not covered on his insurance plan. He also has tried Myrbetriq 50 mg although this was also expensive. He remains in stable overall health otherwise.     ALLERGIES: Codeine Derivatives    MEDICATIONS: Cialis 20 mg tablet 1 tablet PO PRN  Multiple Vitamin  Quinapril Hcl 40 mg tablet Oral  Trazodone Hcl  Turmeric     GU PSH: Prostate Needle Biopsy - 07/15/2018, 2017       PSH Notes: Elbow Arthroscopy   NON-GU PSH: Elbow Arthroscopy/surgery - 2011         GU PMH: BPH w/LUTS - 2018 Nocturia - 2018 Urinary Urgency - 2018 ED due to arterial insufficiency, Erectile dysfunction due to arterial insufficiency - 2017 Prostate Cancer, Prostate cancer - 2017      PMH Notes:   ** Cannot tolerate biopsies in the office even despite oral sedation (requires IV sedation in OR)   1) Prostate cancer: He was noted to have an increase in his PSA from 1.3 in November 2008 up to 3.04 in May 2011. His PSA was rechecked when I first evaluated him in June 2011 and remained elevated at 3.53. 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 He has no family history of prostate cancer. He has been thoroughly counseled by myself and Dr. Tammi Klippel about his treatment options and  elected active surveillance. He was found to have upgraded but low volume Gleason 3+4=7 disease in May 2020. He was again counseled by Dr. Tammi Klippel and me and elected to continue active surveillance.   Initial diagnosis: July 2011  TNM stage: cT1c Nx Mx  PSA at diagnosis: 3.53  Gleason score: 3+3=6  Biopsy ( 09/04/09): 2/12 cores -- L mid (10%), R lateral mid (5%)  Prostate volume: 37.7 cc  PSAD: 0.09   Surveillance:  Jan 2012: MRI -- L medial apical nodule most suspicious area, No EPE, SVI, or LAD  Jan 2012: 4/22 cores positive -- L lateral apex (<5%, 3+3=6), L mid (2/2 cores, 25% and 5%, 3+3=6, PNI) L lateral mid (<5%, 3+3=6), Vol 38.0 cc  Aug 2013: 1/12 cores positive -- L mid (< 5%), multifocal HGPIN, Vol 39.5 cc  Sep 2015: 2/12 cores -- L mid (10%, 3+3=6, PNI), R lateral base (< 5%, 3+3=6), Vol 33.1 cc  Dec 2017: 1/12 core positive -- L apex (20%, 3+3=6), Vol 31 cc  May 2020: 3/12 cores positive -- L lateral apex (5%, 3+3=6), L apex (20%, 3+3=6), R mid (50%, 3+4=7), Vol 41.0 cc, PSAD 0.08 - elected to continue surveillance    Urinary function (2020): IPSS: 15. He has very little bother from his symptoms which include a weak stream, urgency, and nocturia.  Erectile function (2020): SHIM score 10.   2) BPH/LUTS:  His baseline symptoms include frequency, urgency, and nocturia.   Current treatment:  Prior treatment: Vesicare 10 mg (helpful but not covered), Myrbetriq 50 mg   NON-GU PMH: Diabetes Type 2 Hypercholesterolemia Hypertension    FAMILY HISTORY: nephrolithiasis - Father Prostate Cancer - No Family History   SOCIAL HISTORY: Marital Status: Married Preferred Language: English; Ethnicity: Not Hispanic Or Latino; Race: White Current Smoking Status: Patient does not smoke anymore.  <DIV'  Tobacco Use Assessment Completed:  Used Tobacco in last 30 days?       Notes: Former smoker, Marital History - Currently Married, Alcohol Use   REVIEW OF SYSTEMS:     GU Review Male:   Patient denies frequent urination, hard to postpone urination, burning/ pain with urination, get up at night to urinate, leakage of urine, stream starts and stops, trouble starting your streams, and have to strain to urinate .    Gastrointestinal (Upper):  Patient denies nausea and vomiting.    Gastrointestinal (Lower):  Patient denies diarrhea and constipation.    Constitutional:  Patient denies fever, night sweats, weight loss, and fatigue.    Skin:  Patient denies skin rash/ lesion and itching.    Eyes:  Patient denies blurred vision and double vision.    Ears/ Nose/ Throat:  Patient denies sore throat and sinus problems.    Hematologic/Lymphatic:  Patient denies swollen glands and easy bruising.    Cardiovascular:  Patient denies leg swelling and chest pains.    Respiratory:  Patient denies cough and shortness of breath.    Endocrine:  Patient denies excessive thirst.    Musculoskeletal:  Patient denies back pain and joint pain.    Neurological:  Patient denies headaches and dizziness.    Psychologic:  Patient denies depression and anxiety.    VITAL SIGNS:            Weight 191 lb / 86.64 kg     Height 70 in / 177.8 cm     BP 129/67 mmHg     Pulse 93 /min     Temperature 98.0 F / 36.6 C     BMI 27.4 kg/m     GU PHYSICAL EXAMINATION:      Prostate: Prostate about 60 grams. He has anal stenosis as previously noted. No nodularity or induration.     MULTI-SYSTEM PHYSICAL EXAMINATION:      Constitutional: Well-nourished. No physical deformities. Normally developed. Good grooming.            Complexity of Data:   Lab Test Review:  PSA  Records Review:  Previous Patient Records  Urodynamics Review:  Review Bladder Scan    03/16/20 08/26/19 06/13/19 02/18/19 03/12/18 12/04/17 08/28/17 02/20/17  PSA  Total PSA 4.21 ng/mL 5.27 ng/mL 4.21 ng/mL 6.41 ng/mL 3.25 ng/mL 3.88 ng/mL 6.88 ng/mL 5.09 ng/mL  Free PSA       1.05 ng/mL   % Free PSA       15 % PSA     PROCEDURES:    PVR  Ultrasound - 51798  Scanned Volume: 98.0 cc    ASSESSMENT:     ICD-10 Details  1 GU:  Prostate Cancer - C61   2  BPH w/LUTS - N40.1   3  Urinary Urgency - R39.15    PLAN:      1. Favorable intermediate risk prostate cancer: I have recommended that he proceed with a surveillance biopsy for further evaluation considering his upgraded disease in May of 2020. He is agreeable to  proceed and we reviewed the potential risks and complications associated with this procedure. He gives informed consent this will be scheduled in the near future.   2. BPH/LUTS: I have called in a prescription for Toviaz 8 mg so that he can compare this to his oxybutynin. If this is too expensive or not more effective, he will notify me and we can either go back to oxybutynin or consider other anticholinergics and or beta 3 agonist medication.   Cc: Dorothyann Peng, NP

## 2020-08-13 ENCOUNTER — Ambulatory Visit (HOSPITAL_BASED_OUTPATIENT_CLINIC_OR_DEPARTMENT_OTHER)
Admission: RE | Admit: 2020-08-13 | Discharge: 2020-08-13 | Disposition: A | Discharge status: Home / Self Care | Payer: Medicare Other | Attending: Urology | Admitting: Urology

## 2020-08-13 ENCOUNTER — Ambulatory Visit (HOSPITAL_COMMUNITY)
Admission: RE | Admit: 2020-08-13 | Discharge: 2020-08-13 | Disposition: A | Discharge status: Home / Self Care | Payer: Medicare Other | Source: Ambulatory Visit | Attending: Urology | Admitting: Urology

## 2020-08-13 ENCOUNTER — Ambulatory Visit (HOSPITAL_BASED_OUTPATIENT_CLINIC_OR_DEPARTMENT_OTHER): Payer: Medicare Other | Admitting: Anesthesiology

## 2020-08-13 ENCOUNTER — Encounter (HOSPITAL_BASED_OUTPATIENT_CLINIC_OR_DEPARTMENT_OTHER): Payer: Self-pay | Admitting: Urology

## 2020-08-13 ENCOUNTER — Other Ambulatory Visit: Payer: Self-pay

## 2020-08-13 ENCOUNTER — Encounter (HOSPITAL_BASED_OUTPATIENT_CLINIC_OR_DEPARTMENT_OTHER)
Admission: RE | Disposition: A | Discharge status: Home / Self Care | Payer: Self-pay | Source: Home / Self Care | Attending: Urology

## 2020-08-13 DIAGNOSIS — Z885 Allergy status to narcotic agent status: Secondary | ICD-10-CM | POA: Diagnosis not present

## 2020-08-13 DIAGNOSIS — I4819 Other persistent atrial fibrillation: Secondary | ICD-10-CM | POA: Diagnosis not present

## 2020-08-13 DIAGNOSIS — Z79899 Other long term (current) drug therapy: Secondary | ICD-10-CM | POA: Diagnosis not present

## 2020-08-13 DIAGNOSIS — N401 Enlarged prostate with lower urinary tract symptoms: Secondary | ICD-10-CM | POA: Diagnosis not present

## 2020-08-13 DIAGNOSIS — K219 Gastro-esophageal reflux disease without esophagitis: Secondary | ICD-10-CM | POA: Diagnosis not present

## 2020-08-13 DIAGNOSIS — Z841 Family history of disorders of kidney and ureter: Secondary | ICD-10-CM | POA: Diagnosis not present

## 2020-08-13 DIAGNOSIS — Z87891 Personal history of nicotine dependence: Secondary | ICD-10-CM | POA: Insufficient documentation

## 2020-08-13 DIAGNOSIS — Z8042 Family history of malignant neoplasm of prostate: Secondary | ICD-10-CM | POA: Insufficient documentation

## 2020-08-13 DIAGNOSIS — E119 Type 2 diabetes mellitus without complications: Secondary | ICD-10-CM | POA: Diagnosis not present

## 2020-08-13 DIAGNOSIS — C61 Malignant neoplasm of prostate: Secondary | ICD-10-CM

## 2020-08-13 DIAGNOSIS — E785 Hyperlipidemia, unspecified: Secondary | ICD-10-CM | POA: Diagnosis not present

## 2020-08-13 DIAGNOSIS — N4232 Atypical small acinar proliferation of prostate: Secondary | ICD-10-CM | POA: Diagnosis not present

## 2020-08-13 HISTORY — DX: Type 2 diabetes mellitus without complications: E11.9

## 2020-08-13 HISTORY — PX: PROSTATE BIOPSY: SHX241

## 2020-08-13 HISTORY — DX: Male erectile dysfunction, unspecified: N52.9

## 2020-08-13 HISTORY — DX: Unspecified osteoarthritis, unspecified site: M19.90

## 2020-08-13 HISTORY — DX: Benign prostatic hyperplasia without lower urinary tract symptoms: N40.0

## 2020-08-13 HISTORY — DX: Presence of spectacles and contact lenses: Z97.3

## 2020-08-13 LAB — BASIC METABOLIC PANEL
Anion gap: 7 (ref 5–15)
BUN: 12 mg/dL (ref 8–23)
CO2: 23 mmol/L (ref 22–32)
Calcium: 10.5 mg/dL — ABNORMAL HIGH (ref 8.9–10.3)
Chloride: 106 mmol/L (ref 98–111)
Creatinine, Ser: 0.83 mg/dL (ref 0.61–1.24)
GFR, Estimated: >60 mL/min (ref >60)
Glucose, Bld: 141 mg/dL — ABNORMAL HIGH (ref 70–99)
Potassium: 4.2 mmol/L (ref 3.5–5.1)
Sodium: 136 mmol/L (ref 135–145)

## 2020-08-13 LAB — GLUCOSE, CAPILLARY: Glucose-Capillary: 121 mg/dL — ABNORMAL HIGH (ref 70–99)

## 2020-08-13 SURGERY — BIOPSY, PROSTATE, RECTAL APPROACH, WITH US GUIDANCE
Anesthesia: Monitor Anesthesia Care | Site: Prostate

## 2020-08-13 MED ORDER — SODIUM CHLORIDE 0.9 % IV SOLN
INTRAVENOUS | Status: AC
  Filled 2020-08-13: qty 100

## 2020-08-13 MED ORDER — OXYCODONE HCL 5 MG/5ML PO SOLN: 5.0000 mg | Freq: Once | ORAL | Status: DC | PRN

## 2020-08-13 MED ORDER — MIDAZOLAM HCL 5 MG/5ML IJ SOLN
INTRAMUSCULAR | Status: DC | PRN
  Administered 2020-08-13: 1 mg via INTRAVENOUS

## 2020-08-13 MED ORDER — CEFTRIAXONE SODIUM 2 G IJ SOLR
INTRAMUSCULAR | Status: AC
  Filled 2020-08-13: qty 20

## 2020-08-13 MED ORDER — ONDANSETRON HCL 4 MG/2ML IJ SOLN
INTRAMUSCULAR | Status: DC | PRN
  Administered 2020-08-13: 4 mg via INTRAVENOUS

## 2020-08-13 MED ORDER — LIDOCAINE HCL (CARDIAC) PF 100 MG/5ML IV SOSY
PREFILLED_SYRINGE | INTRAVENOUS | Status: DC | PRN
  Administered 2020-08-13: 60 mg via INTRAVENOUS

## 2020-08-13 MED ORDER — SODIUM CHLORIDE 0.9 % IV SOLN
2.0000 g | Freq: Once | INTRAVENOUS | Status: AC
  Administered 2020-08-13: 2 g via INTRAVENOUS

## 2020-08-13 MED ORDER — OXYCODONE HCL 5 MG PO TABS: 5.0000 mg | ORAL_TABLET | Freq: Once | ORAL | Status: DC | PRN

## 2020-08-13 MED ORDER — AMISULPRIDE (ANTIEMETIC) 5 MG/2ML IV SOLN: 10.0000 mg | Freq: Once | INTRAVENOUS | Status: DC | PRN

## 2020-08-13 MED ORDER — SODIUM CHLORIDE 0.9 % IV SOLN: INTRAVENOUS | Status: DC

## 2020-08-13 MED ORDER — MIDAZOLAM HCL 2 MG/2ML IJ SOLN
INTRAMUSCULAR | Status: AC
  Filled 2020-08-13: qty 2

## 2020-08-13 MED ORDER — LIDOCAINE HCL 2 % IJ SOLN
INTRAMUSCULAR | Status: DC | PRN
  Administered 2020-08-13: 10 mL

## 2020-08-13 MED ORDER — PROPOFOL 10 MG/ML IV BOLUS
INTRAVENOUS | Status: AC
  Filled 2020-08-13: qty 20

## 2020-08-13 MED ORDER — PROPOFOL 10 MG/ML IV BOLUS
INTRAVENOUS | Status: DC | PRN
  Administered 2020-08-13 (×2): 20 mg via INTRAVENOUS

## 2020-08-13 MED ORDER — FENTANYL CITRATE (PF) 100 MCG/2ML IJ SOLN: 25.0000 ug | INTRAMUSCULAR | Status: DC | PRN

## 2020-08-13 MED ORDER — ONDANSETRON HCL 4 MG/2ML IJ SOLN: 4.0000 mg | Freq: Once | INTRAMUSCULAR | Status: DC | PRN

## 2020-08-13 MED ORDER — PROPOFOL 500 MG/50ML IV EMUL
INTRAVENOUS | Status: DC | PRN
  Administered 2020-08-13: 100 ug/kg/min via INTRAVENOUS

## 2020-08-13 MED ORDER — FENTANYL CITRATE (PF) 100 MCG/2ML IJ SOLN
INTRAMUSCULAR | Status: DC | PRN
  Administered 2020-08-13: 50 ug via INTRAVENOUS

## 2020-08-13 MED ORDER — FENTANYL CITRATE (PF) 100 MCG/2ML IJ SOLN
INTRAMUSCULAR | Status: AC
  Filled 2020-08-13: qty 2

## 2020-08-13 SURGICAL SUPPLY — 13 items
DRSG TELFA 3X8 NADH (GAUZE/BANDAGES/DRESSINGS) IMPLANT
GLOVE SURG ENC MOIS LTX SZ7.5 (GLOVE) IMPLANT
INST BIOPSY MAXCORE 18GX25 (NEEDLE) ×3 IMPLANT
INSTR BIOPSY MAXCORE 18GX20 (NEEDLE) IMPLANT
KIT TURNOVER CYSTO (KITS) ×3 IMPLANT
NDL SAFETY ECLIPSE 18X1.5 (NEEDLE) ×1 IMPLANT
NEEDLE HYPO 18GX1.5 SHARP (NEEDLE) ×3
NEEDLE SPNL 22GX7 QUINCKE BK (NEEDLE) IMPLANT
SURGILUBE 2OZ TUBE FLIPTOP (MISCELLANEOUS) ×3 IMPLANT
SYR 20ML LL LF (SYRINGE) IMPLANT
TOWEL OR 17X26 10 PK STRL BLUE (TOWEL DISPOSABLE) IMPLANT
UNDERPAD 30X36 HEAVY ABSORB (UNDERPADS AND DIAPERS) ×3 IMPLANT
WATER STERILE IRR 500ML POUR (IV SOLUTION) ×3 IMPLANT

## 2020-08-13 NOTE — Anesthesia Procedure Notes (Signed)
Procedure Name: MAC Date/Time: 08/13/2020 2:35 PM Performed by: Mechele Claude, CRNA Pre-anesthesia Checklist: Patient identified, Emergency Drugs available, Suction available, Patient being monitored and Timeout performed Oxygen Delivery Method: Simple face mask Placement Confirmation: positive ETCO2 and breath sounds checked- equal and bilateral

## 2020-08-13 NOTE — Anesthesia Preprocedure Evaluation (Signed)
Anesthesia Evaluation  Patient identified by MRN, date of birth, ID band Patient awake    Reviewed: Allergy & Precautions, NPO status , Patient's Chart, lab work & pertinent test results, reviewed documented beta blocker date and time   History of Anesthesia Complications Negative for: history of anesthetic complications  Airway Mallampati: II  TM Distance: >3 FB Neck ROM: Full    Dental  (+) Teeth Intact, Dental Advisory Given   Pulmonary neg pulmonary ROS, former smoker,    Pulmonary exam normal        Cardiovascular hypertension, Pt. on medications and Pt. on home beta blockers + CAD (nonobstructive by cath 05/18/20)  Normal cardiovascular exam+ dysrhythmias Atrial Fibrillation      Neuro/Psych negative neurological ROS     GI/Hepatic Neg liver ROS, GERD  ,  Endo/Other  diabetes, Type 2  Renal/GU negative Renal ROS   Prostate cancer    Musculoskeletal  (+) Arthritis ,   Abdominal   Peds  Hematology negative hematology ROS (+) Xarelto   Anesthesia Other Findings  Cath 05/18/20: 1. Minor nonobstructive CAD 2. Normal LV function 3. Normal LVEDP  Echo 04/13/20: LVEF 55% with inferior basal hypokinesis, moderate LAE, mild/mod aortic sclerosis without stenosis  Reproductive/Obstetrics                            Anesthesia Physical Anesthesia Plan  ASA: 3  Anesthesia Plan: MAC   Post-op Pain Management:    Induction: Intravenous  PONV Risk Score and Plan: 1 and Propofol infusion, TIVA and Treatment may vary due to age or medical condition  Airway Management Planned: Natural Airway, Nasal Cannula and Simple Face Mask  Additional Equipment: None  Intra-op Plan:   Post-operative Plan:   Informed Consent: I have reviewed the patients History and Physical, chart, labs and discussed the procedure including the risks, benefits and alternatives for the proposed anesthesia with the  patient or authorized representative who has indicated his/her understanding and acceptance.     Dental advisory given  Plan Discussed with:   Anesthesia Plan Comments:         Anesthesia Quick Evaluation

## 2020-08-13 NOTE — Op Note (Signed)
Preoperative diagnosis: Prostate cancer  Postoperative diagnosis: Prostate cancer  Procedure: 1. Transrectal ultrasound of the prostate 2. Prostate needle biopsy  Surgeon: Dr. Roxy Horseman, Brooke Bonito.  Anesthesia: IV sedation  Complications: None  EBL: Minimal  Specimens: Specimens were obtained from the lateral and parasagittal regions of the base, mid, and apex of the prostate bilaterally. A total of 12 biopsy cores were obtained.  Disposition specimens: Pathology lab.  Indication: James Edwards is a 70 y.o. with prostate cancer.  He is on active surveillance management and returns today for a biopsy of the prostate. I discussed the potential benefits and risks of the procedure, side effects of the proposed treatment, the likelihood of the patient achieving the goals of the procedure, and any potential problems that might occur during the procedure or recuperation. He gave informed consent to proceed.   Description of procedure:  The patient was taken to the operating room and conscious sedation was administered. He had been provided preoperative antibiotics and did prepare himself with an enema prior to his procedure. He was laid in the left lateral decubitus position. The transrectal ultrasound probe was then placed into the rectum and used to visualize the prostate. 10 cc of 1% lidocaine was then utilized to provide local anesthesia with a periprostatic nerve block. Images of the prostate were then obtained systematically. The prostate was mostly homogenous with minimal calcifications throughout the transition zone but no concerning hypoechoic lesions. The prostate volume was measured at 46.1 cc. Systematic biopsies of the prostate were then performed under ultrasound guidance. A total of 12 cores were obtained. Biopsies were taken from the lateral and parasagittal regions of the base, mid, and apex of the prostate bilaterally all under transrectal ultrasound guidance. Following  removal of the transrectal ultrasound, a digital rectal exam was performed and it was confirmed that no significant bleeding was present. The patient tolerated the procedure well and without complications.

## 2020-08-13 NOTE — Discharge Instructions (Addendum)
-   Call if fever > 101 or excessive bleeding - YOU MAY RESTART YOUR XARELTO ON Thursday MORNING IF THERE IS NO BLEEDING FROM THE URINE OR STOOL AT THAT TIME  Post Anesthesia Home Care Instructions  Activity: Get plenty of rest for the remainder of the day. A responsible individual must stay with you for 24 hours following the procedure.  For the next 24 hours, DO NOT: -Drive a car -Paediatric nurse -Drink alcoholic beverages -Take any medication unless instructed by your physician -Make any legal decisions or sign important papers.  Meals: Start with liquid foods such as gelatin or soup. Progress to regular foods as tolerated. Avoid greasy, spicy, heavy foods. If nausea and/or vomiting occur, drink only clear liquids until the nausea and/or vomiting subsides. Call your physician if vomiting continues.  Special Instructions/Symptoms: Your throat may feel dry or sore from the anesthesia or the breathing tube placed in your throat during surgery. If this causes discomfort, gargle with warm salt water. The discomfort should disappear within 24 hours.

## 2020-08-13 NOTE — Transfer of Care (Addendum)
Immediate Anesthesia Transfer of Care Note  Patient: James Edwards  Procedure(s) Performed: Procedure(s) (LRB): BIOPSY TRANSRECTAL ULTRASONIC PROSTATE (TUBP) (N/A)  Patient Location: PACU  Anesthesia Type: MAC  Level of Consciousness: awake, alert  and oriented  Airway & Oxygen Therapy: Patient Spontanous Breathing and Patient connected to face mask oxygen  Post-op Assessment: Report given to PACU RN and Post -op Vital signs reviewed and stable  Post vital signs: Reviewed and stable  Complications: No apparent anesthesia complications  Last Vitals:  Vitals Value Taken Time  BP 127/90 1500  Temp 98.6   Pulse 78   Resp 14   SpO2 97     Last Pain:  Vitals:   08/13/20 1226  TempSrc: Oral  PainSc: 0-No pain      Patients Stated Pain Goal: 7 (43/83/81 8403)  Complications: No notable events documented.

## 2020-08-13 NOTE — Anesthesia Postprocedure Evaluation (Signed)
Anesthesia Post Note  Patient: James Edwards  Procedure(s) Performed: BIOPSY TRANSRECTAL ULTRASONIC PROSTATE (TUBP) (Prostate)     Patient location during evaluation: PACU Anesthesia Type: MAC Level of consciousness: awake Pain management: pain level controlled Vital Signs Assessment: post-procedure vital signs reviewed and stable Respiratory status: spontaneous breathing, nonlabored ventilation, respiratory function stable and patient connected to nasal cannula oxygen Cardiovascular status: stable and blood pressure returned to baseline Postop Assessment: no apparent nausea or vomiting Anesthetic complications: no   No notable events documented.  Last Vitals:  Vitals:   08/13/20 1500 08/13/20 1530  BP: 127/90 (!) 147/90  Pulse: 78 72  Resp: 14 16  Temp: 37 C 37 C  SpO2: 97% 100%    Last Pain:  Vitals:   08/13/20 1530  TempSrc:   PainSc: 0-No pain                 Maeve Debord P Johanne Mcglade

## 2020-08-14 ENCOUNTER — Encounter (HOSPITAL_BASED_OUTPATIENT_CLINIC_OR_DEPARTMENT_OTHER): Payer: Self-pay | Admitting: Urology

## 2020-08-14 LAB — SURGICAL PATHOLOGY

## 2020-08-17 ENCOUNTER — Institutional Professional Consult (permissible substitution): Payer: Medicare Other | Admitting: Internal Medicine

## 2020-08-18 NOTE — Progress Notes (Signed)
08/21/20- 42 yoM former smoker (70 pkyrs) for sleep evaluation courtesy of Dorothyann Peng, NP with concern of OSA Medical problem list includes AFib, HTN, GERD, DM2, Prostate Cancer, Hyperlipidemia, Covid infection 2019,  Meds include- trazodone 50,  Epworth score-4 Body weight today-193 lbs Covid vax- 3 Moderna He was advised by his Cardiologist to seek evaluation for possible OSA due to his AFib. He is aware he snores, "worse when I smoked". Otherwise he feels he sleeps well, minimizing daytime sleepiness and denying complex parasomnias.  Trazodone for sleep, 18 oz coffee/ day. Denies ENT surgery and lung disease. Quit smoking 15 yrs ago. Prostate cancer, now moving beyond observation status.  Married, retired Production assistant, radio.  Prior to Admission medications   Medication Sig Start Date End Date Taking? Authorizing Provider  acetaminophen (TYLENOL) 500 MG tablet Take 1,000 mg by mouth every 6 (six) hours as needed for moderate pain.   Yes [provider]  Cyanocobalamin (B-12) 1000 MCG CAPS Take 1 tablet by mouth daily.   Yes [provider]  losartan (COZAAR) 50 MG tablet Take 1 tablet (50 mg total) by mouth daily. 04/16/20 08/21/20 Yes Freada Bergeron, MD  metoprolol succinate (TOPROL XL) 25 MG 24 hr tablet Take 1 tablet (25 mg total) by mouth daily. Patient taking differently: Take 25 mg by mouth every evening. 07/19/20  Yes Freada Bergeron, MD  metoprolol tartrate (LOPRESSOR) 25 MG tablet Take 1 tablet (25 mg total) by mouth 3 (three) times daily as needed (for break through heart rate greater than 100.). 07/19/20  Yes Freada Bergeron, MD  Multiple Vitamin (MULTIVITAMIN) tablet Take 1 tablet by mouth daily.   Yes [provider]  OVER THE COUNTER MEDICATION Take 9 tablets by mouth daily. Gin Raisin for joint health   Yes [provider]  oxybutynin (DITROPAN-XL) 10 MG 24 hr tablet Take 10 mg by mouth at bedtime. 12/26/16  Yes [provider]   rosuvastatin (CRESTOR) 20 MG tablet Take 1 tablet (20 mg total) by mouth daily. 04/25/20  Yes Freada Bergeron, MD  traZODone (DESYREL) 50 MG tablet TAKE 1 TABLET (50 MG TOTAL) BY MOUTH AT BEDTIME AS NEEDED. 07/02/20  Yes Nafziger, Tommi Rumps, NP  trolamine salicylate (ASPERCREME) 10 % cream Apply 1 application topically as needed for muscle pain.   Yes [provider]  Turmeric 1053 MG TABS Take 3 tablets by mouth daily.   Yes [provider]  nitroGLYCERIN (NITROSTAT) 0.4 MG SL tablet Place 1 tablet (0.4 mg total) under the tongue every 5 (five) minutes as needed for chest pain. 05/14/20 08/13/20  Freada Bergeron, MD  TURMERIC PO Take 1 capsule by mouth daily. Patient not taking: Reported on 08/21/2020    [provider]   Past Medical History:  Diagnosis Date   BPH without obstruction/lower urinary tract symptoms    Diet-controlled type 2 diabetes mellitus ([place])    followed by pcp   ED (erectile dysfunction)    GERD (gastroesophageal reflux disease)    no meds   History of 2019 novel coronavirus disease (COVID-19) 2020   per pt asymptomatic   History of melanoma excision 06/2020   per pt localized area mid back   Hypertension    followed by pcp   OA (osteoarthritis)    Persistent atrial fibrillation ([place]) 03/2020   cardiologist-- dr h. Johney Frame---  on xarelto and bb;  work-up includes TTE, CTA, event monitor, and cardiac cath (results in epic),  pt ef 55-656 per cath  and echo 55%   Prostate cancer ([place]) 08/2009   urologist--- dr Alinda Money--- first dx 07/ 2011,  Gleason 3+3,  active surveillance since   Wears glasses    Past Surgical History:  Procedure Laterality Date   COLONOSCOPY     last one 2019 approx   ELBOW SURGERY Right 2007   LEFT HEART CATH AND CORONARY ANGIOGRAPHY N/A 05/18/2020   Procedure: LEFT HEART CATH AND CORONARY ANGIOGRAPHY;  Surgeon: Martinique, Peter M, MD;  Location: [facility];  Service: Cardiovascular;  Laterality: N/A;    PROSTATE BIOPSY N/A 01/21/2016   Procedure: BIOPSY TRANSRECTAL ULTRASONIC PROSTATE (TUBP);  Surgeon: Raynelle Bring, MD;  Location: [facility];  Service: Urology;  Laterality: N/A;   PROSTATE BIOPSY N/A 07/15/2018   Procedure: NEEDLE BIOPSY TRANSRECTAL ULTRASONIC PROSTATE (TUBP);  Surgeon: Raynelle Bring, MD;  Location: [facility];  Service: Urology;  Laterality: N/A;   PROSTATE BIOPSY N/A 08/13/2020   Procedure: BIOPSY TRANSRECTAL ULTRASONIC PROSTATE (TUBP);  Surgeon: Raynelle Bring, MD;  Location[facility];  Service: Urology;  Laterality: N/A;   .faamh Social History   Socioeconomic History   Marital status: Married    Spouse name: Not on file   Number of children: 1   Years of education: Not on file   Highest education level: Not on file  Occupational History   Not on file  Tobacco Use   Smoking status: Former    Packs/day: 2.00    Years: 35.00    Pack years: 70.00    Types: Cigarettes    Quit date: 2009    Years since quitting: 13.5   Smokeless tobacco: Never  Vaping Use   Vaping Use: Never used  Substance and Sexual Activity   Alcohol use: Yes    Alcohol/week: 42.0 standard drinks    Types: 42 Cans of beer per week    Comment: 6pk beer per day  (12 oz each)   Drug use: Never   Sexual activity: Not on file  Other Topics Concern   Not on file  Social History Narrative   Lives in [place] with wife.   Retired.  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    Two Children who both live locally.       He likes to sleep.    Social Determinants of Health   Financial Resource Strain: Low Risk    Difficulty of Paying Living Expenses: Not hard at all  Food Insecurity: No Food Insecurity   Worried About Charity fundraiser in the Last Year: Never true   [place] in the Last Year: Never true  Transportation Needs: No Transportation Needs   Lack of Transportation (Medical): No   Lack of Transportation  (Non-Medical): No  Physical Activity: Sufficiently Active   Days of Exercise per Week: 7 days   Minutes of Exercise per Session: 60 min  Stress: No Stress Concern Present   Feeling of Stress : Not at all  Social Connections: Moderately Isolated   Frequency of Communication with Friends and Family: More than three times a week   Frequency of Social Gatherings with Friends and Family: Twice a week   Attends Religious Services: Never   Marine scientist or Organizations: No   Attends Archivist Meetings: Never   Marital Status: Married  Human resources officer Violence: Not At Risk   Fear of Current or Ex-Partner: No   Emotionally Abused:  No   Physically Abused: No   Sexually Abused: No   ROS-see HPI   + = positive Constitutional:    weight loss, night sweats, fevers, chills, fatigue, lassitude. HEENT:    +headaches, difficulty swallowing, tooth/dental problems, sore throat,       sneezing, itching, ear ache, nasal congestion, post nasal drip, snoring CV:    chest pain, orthopnea, PND, swelling in lower extremities, anasarca,              dizziness, +palpitations Resp:   shortness of breath with exertion or at rest.                productive cough,   non-productive cough, coughing up of blood.              change in color of mucus.  wheezing.   Skin:    rash or lesions. GI:  No-   heartburn, indigestion, abdominal pain, nausea, vomiting, diarrhea,                 change in bowel habits, loss of appetite GU: dysuria, change in color of urine, no urgency or frequency.   flank pain. MS:   joint pain, +stiffness, decreased range of motion, back pain. Neuro-     nothing unusual Psych:  change in mood or affect.  depression or +anxiety.   memory loss.  OBJ- Physical Exam General- Alert, Oriented, Affect-appropriate, Distress- none acute, not obese Skin- rash-none, lesions- none, excoriation- none Lymphadenopathy- none Head- atraumatic            Eyes- Gross vision intact,  PERRLA, conjunctivae and secretions clear            Ears- Hearing, canals-normal            Nose- Clear, no-Septal dev, mucus, polyps, erosion, perforation             Throat- Mallampati III-IV,  mucosa clear , drainage- none, tonsils- atrophic,  Teeth+ repair Neck- flexible , trachea midline, no stridor , thyroid nl, carotid no bruit Chest - symmetrical excursion , unlabored           Heart/CV- +IRR/ AFib , no murmur , no gallop  , no rub, nl s1 s2                           - JVD- none , edema- none, stasis changes- none, varices- none           Lung- clear to P&A, wheeze- none, cough- none , dullness-none, rub- none           Chest wall-  Abd-  Br/ Gen/ Rectal- Not done, not indicated Extrem- cyanosis- none, clubbing, none, atrophy- none, strength- nl Neuro- grossly intact to observation

## 2020-08-21 ENCOUNTER — Ambulatory Visit (INDEPENDENT_AMBULATORY_CARE_PROVIDER_SITE_OTHER): Payer: Medicare Other | Admitting: Internal Medicine

## 2020-08-21 ENCOUNTER — Encounter: Payer: Self-pay | Admitting: Internal Medicine

## 2020-08-21 ENCOUNTER — Other Ambulatory Visit: Payer: Self-pay

## 2020-08-21 VITALS — BP 136/62 | HR 74 | Temp 97.9°F | Ht 70.0 in | Wt 193.0 lb

## 2020-08-21 DIAGNOSIS — I2510 Atherosclerotic heart disease of native coronary artery without angina pectoris: Secondary | ICD-10-CM

## 2020-08-21 DIAGNOSIS — R0683 Snoring: Secondary | ICD-10-CM

## 2020-08-21 DIAGNOSIS — C61 Malignant neoplasm of prostate: Secondary | ICD-10-CM

## 2020-08-21 DIAGNOSIS — G4733 Obstructive sleep apnea (adult) (pediatric): Secondary | ICD-10-CM | POA: Insufficient documentation

## 2020-08-21 NOTE — Patient Instructions (Signed)
Order- schedule home sleep test  dx snoring  Please call us for results and recommendations about 2 weeks after your sleep test  Feel free to contact us at any point if we can help

## 2020-08-21 NOTE — Assessment & Plan Note (Signed)
Concern is atrial fib, as dicussed. We reviewed issues of OSA in detail. Plan- sleep study, then possibly CPAP would be best initial therapy I appropriate.

## 2020-08-21 NOTE — Assessment & Plan Note (Signed)
Discussed reasons for attention to OSA in case of potential surgery/ anesthesia.

## 2020-08-27 DIAGNOSIS — L918 Other hypertrophic disorders of the skin: Secondary | ICD-10-CM | POA: Diagnosis not present

## 2020-08-27 DIAGNOSIS — Z20822 Contact with and (suspected) exposure to covid-19: Secondary | ICD-10-CM | POA: Diagnosis not present

## 2020-08-27 DIAGNOSIS — Z85828 Personal history of other malignant neoplasm of skin: Secondary | ICD-10-CM | POA: Diagnosis not present

## 2020-08-27 DIAGNOSIS — L821 Other seborrheic keratosis: Secondary | ICD-10-CM | POA: Diagnosis not present

## 2020-08-27 DIAGNOSIS — L570 Actinic keratosis: Secondary | ICD-10-CM | POA: Diagnosis not present

## 2020-08-27 DIAGNOSIS — L814 Other melanin hyperpigmentation: Secondary | ICD-10-CM | POA: Diagnosis not present

## 2020-08-27 DIAGNOSIS — Z85820 Personal history of malignant melanoma of skin: Secondary | ICD-10-CM | POA: Diagnosis not present

## 2020-08-27 DIAGNOSIS — D1801 Hemangioma of skin and subcutaneous tissue: Secondary | ICD-10-CM | POA: Diagnosis not present

## 2020-08-27 DIAGNOSIS — D485 Neoplasm of uncertain behavior of skin: Secondary | ICD-10-CM | POA: Diagnosis not present

## 2020-09-10 ENCOUNTER — Telehealth: Payer: Self-pay

## 2020-09-10 DIAGNOSIS — Z006 Encounter for examination for normal comparison and control in clinical research program: Secondary | ICD-10-CM

## 2020-09-10 NOTE — Telephone Encounter (Signed)
I have attempted without success to contact this patient by phone for his Identify 90 day follow up phone call. I left a message for patient to return my phone call with my name and callback number. An e-mail was also sent to patient.   

## 2020-09-25 ENCOUNTER — Ambulatory Visit: Payer: Medicare Other

## 2020-09-25 ENCOUNTER — Other Ambulatory Visit: Payer: Self-pay

## 2020-09-25 DIAGNOSIS — G4733 Obstructive sleep apnea (adult) (pediatric): Secondary | ICD-10-CM | POA: Diagnosis not present

## 2020-09-25 DIAGNOSIS — R0683 Snoring: Secondary | ICD-10-CM

## 2020-09-26 ENCOUNTER — Telehealth: Payer: Self-pay | Admitting: Pharmacist

## 2020-09-26 NOTE — Chronic Care Management (AMB) (Signed)
Chronic Care Management Pharmacy Assistant   Name: James Edwards  MRN: [id number] DOB: [birth year]    Reason for Encounter: General Adherence Call   Recent office visits:  None  Recent consult visits:  08-21-2020 Deneise Lever, MD (Pulmonology) - Patient presented for snoring. Home sleep test ordered. No medication changes.  08-13-2020 Raynelle Bring MD (Radiology) - Patient presented for Prostate biopsy. No medication changes.  07-19-2020 Freada Bergeron, MD (Cardiology) - Patient presented for persistent atrial fibrillation and other concerns. Prescribed Metoprolol Succinate  25 mg daily. Changed Metoprolol Tartrate 25 mg to 3 times daily PRN   Hospital visits:  Medication Reconciliation was completed by comparing discharge summary, patient's EMR and Pharmacy list, and upon discussion with patient.  Patient visited [facility] on 6-27-200 for Prostate  Biopsy. He was there for 3 hours.    New?Medications Started at [facility] Discharge:?? -started None  Medication Changes at Hospital Discharge: -Changed None  Medications Discontinued at Hospital Discharge: -Stopped None  Medications that remain the same after Hospital Discharge:??  -All other medications will remain the same.    Medications: Outpatient Encounter Medications as of 09/26/2020  Medication Sig   acetaminophen (TYLENOL) 500 MG tablet Take 1,000 mg by mouth every 6 (six) hours as needed for moderate pain.   Cyanocobalamin (B-12) 1000 MCG CAPS Take 1 tablet by mouth daily.   losartan (COZAAR) 50 MG tablet Take 1 tablet (50 mg total) by mouth daily.   metoprolol succinate (TOPROL XL) 25 MG 24 hr tablet Take 1 tablet (25 mg total) by mouth daily. (Patient taking differently: Take 25 mg by mouth every evening.)   metoprolol tartrate (LOPRESSOR) 25 MG tablet Take 1 tablet (25 mg total) by mouth 3 (three) times daily as needed (for break through heart rate greater than 100.).   Multiple Vitamin  (MULTIVITAMIN) tablet Take 1 tablet by mouth daily.   nitroGLYCERIN (NITROSTAT) 0.4 MG SL tablet Place 1 tablet (0.4 mg total) under the tongue every 5 (five) minutes as needed for chest pain.   OVER THE COUNTER MEDICATION Take 9 tablets by mouth daily. Gin Raisin for joint health   oxybutynin (DITROPAN-XL) 10 MG 24 hr tablet Take 10 mg by mouth at bedtime.   rosuvastatin (CRESTOR) 20 MG tablet Take 1 tablet (20 mg total) by mouth daily.   traZODone (DESYREL) 50 MG tablet TAKE 1 TABLET (50 MG TOTAL) BY MOUTH AT BEDTIME AS NEEDED.   trolamine salicylate (ASPERCREME) 10 % cream Apply 1 application topically as needed for muscle pain.   Turmeric 1053 MG TABS Take 3 tablets by mouth daily.   No facility-administered encounter medications on file as of 09/26/2020.   Notes: Patient returned my call, he reports he is now taking Xarelto that is new to him as he has been in atrial fibrillation. He reports he has also been told by is heart doctor to monitor his pulse they do not want it to exceed 100 if so he is to take his Lopressor. He reports he recently acquired a smart watch and he is better able to monitor his heart rate an it has recently been ranging between 78-125 so he has needed to take the medication. He reports he does have a cuff but does not check his pressures at home at all. He confirmed he is taking all of the above medications as prescribed. He is current on all of his fills and not in need of any medications at this time.  Offered a follow up appointment with the Clinical pharmacist on 12-18-2020 at 12 patient accepted. He notes no other concerns or issues at this time.   Care Gaps: Urine Micro - Overdue TDAP- Overdue Zoster Vaccine - Overdue Eye Exam - Overdue HGB A1C- Overdue COVID Booster #4 (Moderna) - Overdue Foot Exam - Overdue Flu Vaccine - Overdue AVW- MSG sent to Ramond Craver CMA to schedule. CCM F /U Call - Scheduled for 12-18-20 at 12   Star Rating Drugs: Pravastatin  (Pravachol) 20 mg - Last filled 06-21-2020 90 DS at [facility] Rosuvastatin (Crestor) 20 mg - Last filled 06-29-2020 90 DS at [facility] Losartan (Cozaar) 50 mg - Last filled 07-02-2020 90 DS at [facility] Pharmacist Assistant [phone]

## 2020-09-28 DIAGNOSIS — G4733 Obstructive sleep apnea (adult) (pediatric): Secondary | ICD-10-CM | POA: Diagnosis not present

## 2020-10-02 ENCOUNTER — Other Ambulatory Visit (HOSPITAL_COMMUNITY): Payer: Self-pay

## 2020-10-02 MED ORDER — RIVAROXABAN 20 MG PO TABS: 20.0000 mg | ORAL_TABLET | Freq: Every day | ORAL | 0 refills | Status: DC

## 2020-10-29 ENCOUNTER — Ambulatory Visit: Payer: Medicare Other

## 2020-10-31 ENCOUNTER — Ambulatory Visit: Payer: Medicare Other

## 2020-11-07 DIAGNOSIS — C61 Malignant neoplasm of prostate: Secondary | ICD-10-CM | POA: Diagnosis not present

## 2020-11-07 LAB — PSA

## 2020-11-12 ENCOUNTER — Telehealth: Payer: Self-pay

## 2020-11-12 NOTE — Telephone Encounter (Signed)
RN left voicemail for call back regarding [facility] appointment.

## 2020-11-13 ENCOUNTER — Encounter: Payer: Self-pay | Admitting: Adult Health

## 2020-11-13 ENCOUNTER — Telehealth: Payer: Self-pay | Admitting: Adult Health

## 2020-11-13 DIAGNOSIS — Z20822 Contact with and (suspected) exposure to covid-19: Secondary | ICD-10-CM | POA: Diagnosis not present

## 2020-11-13 NOTE — Progress Notes (Signed)
Oncology Nurse Navigator Documentation  Oncology Nurse Navigator Flowsheets 11/13/2020 08/27/2018  Abnormal Finding Date 03/23/2020 03/12/2018  Confirmed Diagnosis Date 08/13/2020 07/15/2018  Diagnosis Status Confirmed Diagnosis Complete Confirmed Diagnosis Complete  Planned Course of Treatment (No Data) -  Navigation Complete Date: - 08/27/2018  Navigator Location [facility]  Referral Date to RadOnc/MedOnc 08/17/2020 08/11/2018  Navigator Encounter Type Introductory Phone Call Telephone  Telephone - [place] Call  [facility] Date 11/30/2020 -  Multidisiplinary Clinic Type Prostate -  Barriers/Navigation Needs Coordination of Care;No Barriers At This Time No Barriers At This Time  Interventions Coordination of Care -  Acuity Level 1-No Barriers Level 1  Coordination of Care Appts -  Time Spent with Patient 15 15

## 2020-11-13 NOTE — Progress Notes (Deleted)
Oncology Nurse Navigator Documentation  Oncology Nurse Navigator Flowsheets 11/13/2020 08/27/2018  Abnormal Finding Date 03/23/2020 03/12/2018  Confirmed Diagnosis Date 08/13/2020 07/15/2018  Diagnosis Status Confirmed Diagnosis Complete Confirmed Diagnosis Complete  Planned Course of Treatment (No Data) -  Navigation Complete Date: - 08/27/2018  Navigator Location [facility]  Referral Date to RadOnc/MedOnc 08/17/2020 08/11/2018  Navigator Encounter Type Introductory Phone Call Telephone  Telephone - [place] Call  [facility] Date 11/30/2020 -  Multidisiplinary Clinic Type Prostate -  Barriers/Navigation Needs Coordination of Care;No Barriers At This Time No Barriers At This Time  Interventions Coordination of Care -  Acuity Level 1-No Barriers Level 1  Coordination of Care Appts -  Time Spent with Patient 15 15

## 2020-11-13 NOTE — Telephone Encounter (Signed)
FYI

## 2020-11-13 NOTE — Telephone Encounter (Signed)
Spoke to pt and he stated that he read the test wrong. Pt stated that he took 2 and they were both neg. Pt stated that he has a hx of faituge and night sweats and is feeling better now. Pt declined a visit. Pt advised to call back if anything changes.

## 2020-11-13 NOTE — Telephone Encounter (Signed)
Please see Mychart message.

## 2020-11-13 NOTE — Telephone Encounter (Signed)
Patient wants a callback about being covid positive. I did offer patient virtual appointment but patient would rather have a call from [facility]. Patient was sent to triage.     Good callback number is [phone]

## 2020-11-15 ENCOUNTER — Ambulatory Visit (INDEPENDENT_AMBULATORY_CARE_PROVIDER_SITE_OTHER): Payer: Medicare Other

## 2020-11-15 DIAGNOSIS — Z0000 Encounter for general adult medical examination without abnormal findings: Secondary | ICD-10-CM

## 2020-11-15 NOTE — Progress Notes (Signed)
Subjective:   James Edwards is a 70 y.o. male who presents for an Initial Medicare Annual Wellness Visit.  I connected with James Edwards today by telephone and verified that I am speaking with the correct person using two identifiers. Location patient: home Location provider: work Persons participating in the virtual visit: patient, provider.   I discussed the limitations, risks, security and privacy concerns of performing an evaluation and management service by telephone and the availability of in person appointments. I also discussed with the patient that there may be a patient responsible charge related to this service. The patient expressed understanding and verbally consented to this telephonic visit.    Interactive audio and video telecommunications were attempted between this provider and patient, however failed, due to patient having technical difficulties OR patient did not have access to video capability.  We continued and completed visit with audio only.    Review of Systems    N/A Cardiac Risk Factors include: advanced age (>29men, >99 women);dyslipidemia;hypertension;male gender     Objective:    There were no vitals filed for this visit. There is no height or weight on file to calculate BMI.  Advanced Directives 11/15/2020 08/13/2020 05/18/2020 10/26/2019 08/17/2018 07/15/2018 07/09/2018  Does Patient Have a Medical Advance Directive? Yes Yes Yes Yes No Yes Yes  Type of Paramedic of [place];Living will [place];Living will [place];Living will [place];Living will - Living will Living will  Does patient want to make changes to medical advance directive? - - No - Patient declined No - Patient declined - No - Patient declined No - Patient declined  Copy of [place] in Chart? No - copy requested No - copy requested No - copy requested No - copy requested - - -  Would  patient like information on creating a medical advance directive? - - - - No - Patient declined - -    Current Medications (verified) Outpatient Encounter Medications as of 11/15/2020  Medication Sig   acetaminophen (TYLENOL) 500 MG tablet Take 1,000 mg by mouth every 6 (six) hours as needed for moderate pain.   Cyanocobalamin (B-12) 1000 MCG CAPS Take 1 tablet by mouth daily.   metoprolol succinate (TOPROL XL) 25 MG 24 hr tablet Take 1 tablet (25 mg total) by mouth daily. (Patient taking differently: Take 25 mg by mouth every evening.)   metoprolol tartrate (LOPRESSOR) 25 MG tablet Take 1 tablet (25 mg total) by mouth 3 (three) times daily as needed (for break through heart rate greater than 100.).   Multiple Vitamin (MULTIVITAMIN) tablet Take 1 tablet by mouth daily.   OVER THE COUNTER MEDICATION Take 9 tablets by mouth daily. Gin Raisin for joint health   oxybutynin (DITROPAN-XL) 10 MG 24 hr tablet Take 10 mg by mouth at bedtime.   rosuvastatin (CRESTOR) 20 MG tablet Take 1 tablet (20 mg total) by mouth daily.   traZODone (DESYREL) 50 MG tablet TAKE 1 TABLET (50 MG TOTAL) BY MOUTH AT BEDTIME AS NEEDED.   trolamine salicylate (ASPERCREME) 10 % cream Apply 1 application topically as needed for muscle pain.   Turmeric 1053 MG TABS Take 3 tablets by mouth daily.   losartan (COZAAR) 50 MG tablet Take 1 tablet (50 mg total) by mouth daily.   nitroGLYCERIN (NITROSTAT) 0.4 MG SL tablet Place 1 tablet (0.4 mg total) under the tongue every 5 (five) minutes as needed for chest pain.  rivaroxaban (XARELTO) 20 MG TABS tablet Take 1 tablet (20 mg total) by mouth daily with supper.   No facility-administered encounter medications on file as of 11/15/2020.    Allergies (verified) Codeine   History: Past Medical History:  Diagnosis Date   BPH without obstruction/lower urinary tract symptoms    Diet-controlled type 2 diabetes mellitus ([place])    followed by pcp   ED (erectile dysfunction)    GERD  (gastroesophageal reflux disease)    no meds   History of 2019 novel coronavirus disease (COVID-19) 2020   per pt asymptomatic   History of melanoma excision 06/2020   per pt localized area mid back   Hypertension    followed by pcp   OA (osteoarthritis)    Persistent atrial fibrillation ([place]) 03/2020   cardiologist-- dr h. 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,  active surveillance since   Wears glasses    Past Surgical History:  Procedure Laterality Date   COLONOSCOPY     last one 2019 approx   ELBOW SURGERY Right 2007   LEFT HEART CATH AND CORONARY ANGIOGRAPHY N/A 05/18/2020   Procedure: LEFT HEART CATH AND CORONARY ANGIOGRAPHY;  Surgeon: Martinique, Peter M, MD;  Location: [facility];  Service: Cardiovascular;  Laterality: N/A;   PROSTATE BIOPSY N/A 01/21/2016   Procedure: BIOPSY TRANSRECTAL ULTRASONIC PROSTATE (TUBP);  Surgeon: Raynelle Bring, MD;  Location: [facility];  Service: Urology;  Laterality: N/A;   PROSTATE BIOPSY N/A 07/15/2018   Procedure: NEEDLE BIOPSY TRANSRECTAL ULTRASONIC PROSTATE (TUBP);  Surgeon: Raynelle Bring, MD;  Location: [facility];  Service: Urology;  Laterality: N/A;   PROSTATE BIOPSY N/A 08/13/2020   Procedure: BIOPSY TRANSRECTAL ULTRASONIC PROSTATE (TUBP);  Surgeon: Raynelle Bring, MD;  Location: [facility];  Service: Urology;  Laterality: N/A;   Family History  Problem Relation Age of Onset   Arthritis Father    Heart disease Father    Hypertension Father    Bladder Cancer Father    Colon polyps Father    Cancer Father        bladder   Heart disease Mother    Hypertension Mother    Bladder Cancer Mother    Glaucoma Mother    Dementia Brother    Colon cancer Neg Hx    Esophageal cancer Neg Hx    Rectal cancer Neg Hx    Stomach  cancer Neg Hx    Breast cancer Neg Hx    Prostate cancer Neg Hx    Social History   Socioeconomic History   Marital status: Married    Spouse name: Not on file   Number of children: 1   Years of education: Not on file   Highest education level: Not on file  Occupational History   Not on file  Tobacco Use   Smoking status: Former    Packs/day: 2.00    Years: 35.00    Pack years: 70.00    Types: Cigarettes    Quit date: 2009    Years since quitting: 13.7   Smokeless tobacco: Never  Vaping Use   Vaping Use: Never used  Substance and Sexual Activity   Alcohol use: Yes    Alcohol/week: 42.0 standard drinks    Types: 42 Cans of beer  per week    Comment: 6pk beer per day  (12 oz each)   Drug use: Never   Sexual activity: Not on file  Other Topics Concern   Not on file  Social History Narrative   Lives in [place] with wife.   Retired.  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    Two Children who both live locally.       He likes to sleep.    Social Determinants of Health   Financial Resource Strain: Low Risk    Difficulty of Paying Living Expenses: Not hard at all  Food Insecurity: No Food Insecurity   Worried About Charity fundraiser in the Last Year: Never true   [place] in the Last Year: Never true  Transportation Needs: No Transportation Needs   Lack of Transportation (Medical): No   Lack of Transportation (Non-Medical): No  Physical Activity: Sufficiently Active   Days of Exercise per Week: 7 days   Minutes of Exercise per Session: 50 min  Stress: No Stress Concern Present   Feeling of Stress : Not at all  Social Connections: Moderately Isolated   Frequency of Communication with Friends and Family: Three times a week   Frequency of Social Gatherings with Friends and Family: Three times a week   Attends Religious Services: Never   Active Member of Clubs or Organizations: No   Attends Programme researcher, broadcasting/film/video: Never   Marital Status: Married    Tobacco Counseling Counseling given: Not Answered   Clinical Intake:  Pre-visit preparation completed: Yes  Pain : No/denies pain     Nutritional Risks: None Diabetes: No  How often do you need to have someone help you when you read instructions, pamphlets, or other written materials from your doctor or pharmacy?: 1 - Never What is the last grade level you completed in school?: BA  Diabetic?no  Interpreter Needed?: No  Information entered by :: L.Compton Brigance,LPN   Activities of Daily Living In your present state of health, do you have any difficulty performing the following activities: 11/15/2020 08/13/2020  Hearing? N N  Vision? N N  Difficulty concentrating or making decisions? N N  Walking or climbing stairs? N N  Dressing or bathing? N N  Doing errands, shopping? N -  Preparing Food and eating ? N -  Using the Toilet? N -  In the past six months, have you accidently leaked urine? N -  Do you have problems with loss of bowel control? N -  Managing your Medications? N -  Managing your Finances? N -  Housekeeping or managing your Housekeeping? N -  Some recent data might be hidden    Patient Care Team: Dorothyann Peng, NP as PCP - General (Family Medicine) Freada Bergeron, MD as PCP - Cardiology (Cardiology) Thompson Grayer, MD as PCP - Electrophysiology (Clinical Cardiac Electrophysiology) [facility], [facility] Viona Gilmore, [facility] as Pharmacist (Pharmacist) Katheren Puller as Oncology Nurse Navigator  Indicate any recent Medical Services you may have received from other than [facility] providers in the past year (date may be approximate).     Assessment:   This is a routine wellness examination for James Edwards.  Hearing/Vision screen Vision Screening - Comments:: Annual eye exams wear glasses   Dietary issues and exercise activities discussed: Current Exercise Habits: Home exercise routine, Type of  exercise: walking, Time (Minutes): 45, Frequency (Times/Week): 7, Weekly Exercise (Minutes/Week): 315,  Intensity: Mild, Exercise limited by: None identified   Goals Addressed             This Visit's Progress    Patient Stated   On track    Keep on walking!      Patient Stated   On track    I will continue to walk 5 miles per day        Depression Screen PHQ 2/9 Scores 11/15/2020 10/26/2019 05/24/2019 05/24/2019 08/24/2017 01/02/2017 08/18/2016  PHQ - 2 Score 0 0 0 0 0 0 0  PHQ- 9 Score - 0 - - - - -    Fall Risk Fall Risk  11/15/2020 10/26/2019 05/24/2019 01/12/2019 08/24/2017  Falls in the past year? 0 0 0 0 No  Comment - - - Emmi Telephone Survey: data to providers prior to load -  Number falls in past yr: 0 0 - - -  Injury with Fall? 0 0 - - -  Risk for fall due to : - Medication side effect - - -  Follow up Falls evaluation completed Falls evaluation completed;Falls prevention discussed - - -    FALL RISK PREVENTION PERTAINING TO THE HOME:  Any stairs in or around the home? Yes  If so, are there any without handrails? No  Home free of loose throw rugs in walkways, pet beds, electrical cords, etc? Yes  Adequate lighting in your home to reduce risk of falls? Yes   ASSISTIVE DEVICES UTILIZED TO PREVENT FALLS:  Life alert? No  Use of a cane, walker or w/c? No  Grab bars in the bathroom? Yes  Shower chair or bench in shower? No  Elevated toilet seat or a handicapped toilet? No    Cognitive Function:  Normal cognitive status assessed by direct observation by this Nurse Health Advisor. No abnormalities found.     6CIT Screen 10/26/2019  What Year? 0 points  What month? 0 points  What time? 0 points  Count back from 20 0 points  Months in reverse 2 points  Repeat phrase 0 points  Total Score 2    Immunizations Immunization History  Administered Date(s) Administered   Influenza, High Dose Seasonal PF 10/18/2014, 10/24/2015, 12/05/2017, 12/07/2019   Moderna SARS-COV2 Booster  Vaccination 12/13/2019   Moderna Sars-Covid-2 Vaccination 04/02/2019, 04/30/2019   Pneumococcal Conjugate-13 10/18/2014   Pneumococcal Polysaccharide-23 10/24/2015    TDAP status: Due, Education has been provided regarding the importance of this vaccine. Advised may receive this vaccine at local pharmacy or Health Dept. Aware to provide a copy of the vaccination record if obtained from local pharmacy or Health Dept. Verbalized acceptance and understanding.  Flu Vaccine status: Up to date  Pneumococcal vaccine status: Up to date  Covid-19 vaccine status: Completed vaccines  Qualifies for Shingles Vaccine? Yes   Zostavax completed No   Shingrix Completed?: No.    Education has been provided regarding the importance of this vaccine. Patient has been advised to call insurance company to determine out of pocket expense if they have not yet received this vaccine. Advised may also receive vaccine at local pharmacy or Health Dept. Verbalized acceptance and understanding.  Screening Tests Health Maintenance  Topic Date Due   URINE MICROALBUMIN  Never done   TETANUS/TDAP  Never done   Zoster Vaccines- Shingrix (1 of 2) Never done   OPHTHALMOLOGY EXAM  08/18/2019   HEMOGLOBIN A1C  11/23/2019   COVID-19 Vaccine (4 - Booster for Moderna series) 03/06/2020   FOOT EXAM  05/23/2020  INFLUENZA VACCINE  09/17/2020   COLONOSCOPY (Pts 45-9yrs Insurance coverage will need to be confirmed)  10/08/2022   Hepatitis C Screening  Completed   HPV VACCINES  Aged Out    Health Maintenance  Health Maintenance Due  Topic Date Due   URINE MICROALBUMIN  Never done   TETANUS/TDAP  Never done   Zoster Vaccines- Shingrix (1 of 2) Never done   OPHTHALMOLOGY EXAM  08/18/2019   HEMOGLOBIN A1C  11/23/2019   COVID-19 Vaccine (4 - Booster for Moderna series) 03/06/2020   FOOT EXAM  05/23/2020   INFLUENZA VACCINE  09/17/2020    Colorectal cancer screening: Type of screening: Colonoscopy. Completed 10/07/2017.  Repeat every 10 years  Lung Cancer Screening: (Low Dose CT Chest recommended if Age 36-80 years, 30 pack-year currently smoking OR have quit w/in 15years.) does not qualify.   Lung Cancer Screening Referral: n/a  Additional Screening:  Hepatitis C Screening: does not qualify; Completed 03/05/2018  Vision Screening: Recommended annual ophthalmology exams for early detection of glaucoma and other disorders of the eye. Is the patient up to date with their annual eye exam?  Yes  Who is the provider or what is the name of the office in which the patient attends annual eye exams? [facility] If pt is not established with a provider, would they like to be referred to a provider to establish care? No .   Dental Screening: Recommended annual dental exams for proper oral hygiene  Community Resource Referral / Chronic Care Management: CRR required this visit?  No   CCM required this visit?  No      Plan:     I have personally reviewed and noted the following in the patient's chart:   Medical and social history Use of alcohol, tobacco or illicit drugs  Current medications and supplements including opioid prescriptions. Patient is not currently taking opioid prescriptions. Functional ability and status Nutritional status Physical activity Advanced directives List of other physicians Hospitalizations, surgeries, and ER visits in previous 12 months Vitals Screenings to include cognitive, depression, and falls Referrals and appointments  In addition, I have reviewed and discussed with patient certain preventive protocols, quality metrics, and best practice recommendations. A written personalized care plan for preventive services as well as general preventive health recommendations were provided to patient.     Randel Pigg, LPN   [id number]   Nurse Notes: none

## 2020-11-15 NOTE — Patient Instructions (Signed)
[name] , Thank you for taking time to come for your Medicare Wellness Visit. I appreciate your ongoing commitment to your health goals. Please review the following plan we discussed and let me know if I can assist you in the future.   Screening recommendations/referrals: Colonoscopy: 10/07/2017  due 2029 Recommended yearly ophthalmology/optometry visit for glaucoma screening and checkup Recommended yearly dental visit for hygiene and checkup  Vaccinations: Influenza vaccine: due in fall 2022  Pneumococcal vaccine: completed series  Tdap vaccine: due with injury  Shingles vaccine: will consider     Advanced directives: will provide copies   Conditions/risks identified: none   Next appointment: none   Preventive Care 71 Years and Older, Male Preventive care refers to lifestyle choices and visits with your health care provider that can promote health and wellness. What does preventive care include? A yearly physical exam. This is also called an annual well check. Dental exams once or twice a year. Routine eye exams. Ask your health care provider how often you should have your eyes checked. Personal lifestyle choices, including: Daily care of your teeth and gums. Regular physical activity. Eating a healthy diet. Avoiding tobacco and drug use. Limiting alcohol use. Practicing safe sex. Taking low doses of aspirin every day. Taking vitamin and mineral supplements as recommended by your health care provider. What happens during an annual well check? The services and screenings done by your health care provider during your annual well check will depend on your age, overall health, lifestyle risk factors, and family history of disease. Counseling  Your health care provider may ask you questions about your: Alcohol use. Tobacco use. Drug use. Emotional well-being. Home and relationship well-being. Sexual activity. Eating habits. History of falls. Memory and ability to  understand (cognition). Work and work Statistician. Screening  You may have the following tests or measurements: Height, weight, and BMI. Blood pressure. Lipid and cholesterol levels. These may be checked every 5 years, or more frequently if you are over 29 years old. Skin check. Lung cancer screening. You may have this screening every year starting at age 71 if you have a 30-pack-year history of smoking and currently smoke or have quit within the past 15 years. Fecal occult blood test (FOBT) of the stool. You may have this test every year starting at age 71. Flexible sigmoidoscopy or colonoscopy. You may have a sigmoidoscopy every 5 years or a colonoscopy every 10 years starting at age 71. Prostate cancer screening. Recommendations will vary depending on your family history and other risks. Hepatitis C blood test. Hepatitis B blood test. Sexually transmitted disease (STD) testing. Diabetes screening. This is done by checking your blood sugar (glucose) after you have not eaten for a while (fasting). You may have this done every 1-3 years. Abdominal aortic aneurysm (AAA) screening. You may need this if you are a current or former smoker. Osteoporosis. You may be screened starting at age 71 if you are at high risk. Talk with your health care provider about your test results, treatment options, and if necessary, the need for more tests. Vaccines  Your health care provider may recommend certain vaccines, such as: Influenza vaccine. This is recommended every year. Tetanus, diphtheria, and acellular pertussis (Tdap, Td) vaccine. You may need a Td booster every 10 years. Zoster vaccine. You may need this after age 71. Pneumococcal 13-valent conjugate (PCV13) vaccine. One dose is recommended after age 71. Pneumococcal polysaccharide (PPSV23) vaccine. One dose is recommended after age 71. Talk to your health  care provider about which screenings and vaccines you need and how often you need them. This  information is not intended to replace advice given to you by your health care provider. Make sure you discuss any questions you have with your health care provider. Document Released: 03/02/2015 Document Revised: 10/24/2015 Document Reviewed: 12/05/2014 Elsevier Interactive Patient Education  2017 [place] Prevention in the Home Falls can cause injuries. They can happen to people of all ages. There are many things you can do to make your home safe and to help prevent falls. What can I do on the outside of my home? Regularly fix the edges of walkways and driveways and fix any cracks. Remove anything that might make you trip as you walk through a door, such as a raised step or threshold. Trim any bushes or trees on the path to your home. Use bright outdoor lighting. Clear any walking paths of anything that might make someone trip, such as rocks or tools. Regularly check to see if handrails are loose or broken. Make sure that both sides of any steps have handrails. Any raised decks and porches should have guardrails on the edges. Have any leaves, snow, or ice cleared regularly. Use sand or salt on walking paths during winter. Clean up any spills in your garage right away. This includes oil or grease spills. What can I do in the bathroom? Use night lights. Install grab bars by the toilet and in the tub and shower. Do not use towel bars as grab bars. Use non-skid mats or decals in the tub or shower. If you need to sit down in the shower, use a plastic, non-slip stool. Keep the floor dry. Clean up any water that spills on the floor as soon as it happens. Remove soap buildup in the tub or shower regularly. Attach bath mats securely with double-sided non-slip rug tape. Do not have throw rugs and other things on the floor that can make you trip. What can I do in the bedroom? Use night lights. Make sure that you have a light by your bed that is easy to reach. Do not use any sheets or  blankets that are too big for your bed. They should not hang down onto the floor. Have a firm chair that has side arms. You can use this for support while you get dressed. Do not have throw rugs and other things on the floor that can make you trip. What can I do in the kitchen? Clean up any spills right away. Avoid walking on wet floors. Keep items that you use a lot in easy-to-reach places. If you need to reach something above you, use a strong step stool that has a grab bar. Keep electrical cords out of the way. Do not use floor polish or wax that makes floors slippery. If you must use wax, use non-skid floor wax. Do not have throw rugs and other things on the floor that can make you trip. What can I do with my stairs? Do not leave any items on the stairs. Make sure that there are handrails on both sides of the stairs and use them. Fix handrails that are broken or loose. Make sure that handrails are as long as the stairways. Check any carpeting to make sure that it is firmly attached to the stairs. Fix any carpet that is loose or worn. Avoid having throw rugs at the top or bottom of the stairs. If you do have throw rugs, attach them to  the floor with carpet tape. Make sure that you have a light switch at the top of the stairs and the bottom of the stairs. If you do not have them, ask someone to add them for you. What else can I do to help prevent falls? Wear shoes that: Do not have high heels. Have rubber bottoms. Are comfortable and fit you well. Are closed at the toe. Do not wear sandals. If you use a stepladder: Make sure that it is fully opened. Do not climb a closed stepladder. Make sure that both sides of the stepladder are locked into place. Ask someone to hold it for you, if possible. Clearly mark and make sure that you can see: Any grab bars or handrails. First and last steps. Where the edge of each step is. Use tools that help you move around (mobility aids) if they are  needed. These include: Canes. Walkers. Scooters. Crutches. Turn on the lights when you go into a dark area. Replace any light bulbs as soon as they burn out. Set up your furniture so you have a clear path. Avoid moving your furniture around. If any of your floors are uneven, fix them. If there are any pets around you, be aware of where they are. Review your medicines with your doctor. Some medicines can make you feel dizzy. This can increase your chance of falling. Ask your doctor what other things that you can do to help prevent falls. This information is not intended to replace advice given to you by your health care provider. Make sure you discuss any questions you have with your health care provider. Document Released: 11/30/2008 Document Revised: 07/12/2015 Document Reviewed: 03/10/2014 Elsevier Interactive Patient Education  2017 [employer].

## 2020-11-27 DIAGNOSIS — L814 Other melanin hyperpigmentation: Secondary | ICD-10-CM | POA: Diagnosis not present

## 2020-11-27 DIAGNOSIS — D1801 Hemangioma of skin and subcutaneous tissue: Secondary | ICD-10-CM | POA: Diagnosis not present

## 2020-11-27 DIAGNOSIS — L570 Actinic keratosis: Secondary | ICD-10-CM | POA: Diagnosis not present

## 2020-11-27 DIAGNOSIS — Z85828 Personal history of other malignant neoplasm of skin: Secondary | ICD-10-CM | POA: Diagnosis not present

## 2020-11-27 DIAGNOSIS — Z85820 Personal history of malignant melanoma of skin: Secondary | ICD-10-CM | POA: Diagnosis not present

## 2020-11-28 NOTE — Progress Notes (Signed)
I called pt to introduce myself as the Prostate Nurse Navigator and the Coordinator of the Prostate [place].   1. I confirmed with the patient he is aware of his referral to the clinic 10/25, arriving @ 12:30 pm.    2. I discussed the format of the clinic and the physicians he will be seeing that day.   3. I discussed where the clinic is located and how to contact me.   4. I confirmed his address and informed him I would be mailing a packet of information and forms to be completed. I asked him to bring them with him the day of his appointment.    He voiced understanding of the above. I asked him to call me if he has any questions or concerns regarding his appointments or the forms he needs to complete.

## 2020-11-30 ENCOUNTER — Ambulatory Visit: Payer: Medicare Other | Admitting: Radiation Oncology

## 2020-12-11 ENCOUNTER — Encounter: Payer: Self-pay | Admitting: Genetic Counselor

## 2020-12-11 ENCOUNTER — Inpatient Hospital Stay: Payer: Medicare Other | Attending: Oncology | Admitting: Oncology

## 2020-12-11 ENCOUNTER — Inpatient Hospital Stay: Payer: Medicare Other

## 2020-12-11 ENCOUNTER — Encounter: Payer: Self-pay | Admitting: General Practice

## 2020-12-11 ENCOUNTER — Other Ambulatory Visit: Payer: Self-pay

## 2020-12-11 ENCOUNTER — Ambulatory Visit (HOSPITAL_BASED_OUTPATIENT_CLINIC_OR_DEPARTMENT_OTHER): Payer: Medicare Other | Admitting: Genetic Counselor

## 2020-12-11 ENCOUNTER — Ambulatory Visit
Admission: RE | Admit: 2020-12-11 | Discharge: 2020-12-11 | Disposition: A | Discharge status: Home / Self Care | Payer: Medicare Other | Source: Ambulatory Visit | Attending: Radiation Oncology | Admitting: Radiation Oncology

## 2020-12-11 ENCOUNTER — Other Ambulatory Visit: Payer: Self-pay | Admitting: Genetic Counselor

## 2020-12-11 VITALS — BP 135/77 | HR 107 | Temp 97.8°F | Resp 20 | Ht 70.0 in | Wt 192.6 lb

## 2020-12-11 DIAGNOSIS — R351 Nocturia: Secondary | ICD-10-CM | POA: Diagnosis not present

## 2020-12-11 DIAGNOSIS — Z8261 Family history of arthritis: Secondary | ICD-10-CM | POA: Insufficient documentation

## 2020-12-11 DIAGNOSIS — Z818 Family history of other mental and behavioral disorders: Secondary | ICD-10-CM | POA: Insufficient documentation

## 2020-12-11 DIAGNOSIS — C439 Malignant melanoma of skin, unspecified: Secondary | ICD-10-CM

## 2020-12-11 DIAGNOSIS — Z8052 Family history of malignant neoplasm of bladder: Secondary | ICD-10-CM | POA: Diagnosis not present

## 2020-12-11 DIAGNOSIS — C61 Malignant neoplasm of prostate: Secondary | ICD-10-CM | POA: Diagnosis not present

## 2020-12-11 DIAGNOSIS — Z8371 Family history of colonic polyps: Secondary | ICD-10-CM | POA: Diagnosis not present

## 2020-12-11 DIAGNOSIS — Z7289 Other problems related to lifestyle: Secondary | ICD-10-CM | POA: Diagnosis not present

## 2020-12-11 DIAGNOSIS — Z83511 Family history of glaucoma: Secondary | ICD-10-CM | POA: Insufficient documentation

## 2020-12-11 DIAGNOSIS — I10 Essential (primary) hypertension: Secondary | ICD-10-CM | POA: Diagnosis not present

## 2020-12-11 DIAGNOSIS — Z87891 Personal history of nicotine dependence: Secondary | ICD-10-CM | POA: Diagnosis not present

## 2020-12-11 DIAGNOSIS — R3915 Urgency of urination: Secondary | ICD-10-CM | POA: Diagnosis not present

## 2020-12-11 DIAGNOSIS — Z8249 Family history of ischemic heart disease and other diseases of the circulatory system: Secondary | ICD-10-CM | POA: Diagnosis not present

## 2020-12-11 LAB — GENETIC SCREENING ORDER

## 2020-12-11 NOTE — Progress Notes (Signed)
[place] Psychosocial Distress Screening Spiritual Care  Met with Brason BerthelotEd") and his wife Berline Chough in [facility] to introduce [facility] team/resources, reviewing distress screen per protocol.  The patient scored a 4 on the Psychosocial Distress Thermometer which indicates moderate distress. Also assessed for distress and other psychosocial needs.   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 Ed and Laurel's daughter died three years ago, which is of course a source of deep grief.  Provided empathic listening, emotional support, normalization of feelings, and encouragement to explore [facility] programming, including Prostate Cancer and Caregiver Support Groups.   Follow up needed: Yes.  "If you support my wife, you're supporting me." We plan for me to reach out to [place] next week to address grief and explore support resources.   [place], [place], [facility] Pager [phone] Voicemail [phone]

## 2020-12-11 NOTE — Consult Note (Signed)
Multi-Disciplinary Clinic     12/11/2020   --------------------------------------------------------------------------------   [name]  MRN: [id number]  DOB: [birth year], 70 year old Male  SSN: [id number]   PRIMARY CARE:  Dorothyann Peng, NP  REFERRING:  Raynelle Bring, Eduardo Osier  PROVIDER:  Raynelle Bring, M.D.  LOCATION:  [facility] [phone]     --------------------------------------------------------------------------------   CC/HPI: CC: Prostate Cancer   PCP: Dorothyann Peng, NP  Location of consult: [facility] - Prostate Cancer Multidisciplinary [facility]   [name] is a 70 year old gentleman with a past medical history of diabetes, hyperlipidemia, and hypertension. 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 He was counseled by myself and Dr. Tammi Klippel at that time and elected active surveillance.   Surveillance:  Jan 2012: MRI -- L medial apical nodule most suspicious area, No EPE, SVI, or LAD  Jan 2012: 4/22 cores positive -- L lateral apex (<5%, 3+3=6), L mid (2/2 cores, 25% and 5%, 3+3=6, PNI) L lateral mid (<5%, 3+3=6), Vol 38.0 cc  Aug 2013: 1/12 cores positive -- L mid (< 5%), multifocal HGPIN, Vol 39.5 cc  Sep 2015: 2/12 cores -- L mid (10%, 3+3=6, PNI), R lateral base (< 5%, 3+3=6), Vol 33.1 cc  Dec 2017: 1/12 core positive -- L apex (20%, 3+3=6), Vol 31 cc  May 2020: 3/12 cores positive -- L lateral apex (5%, 3+3=6), L apex (20%, 3+3=6), R mid (50%, 3+4=7), Vol 41.0 cc, PSAD 0.08 - elected to continue surveillance   He was noted to have upgraded Gleason 3+4=7 adenocarcinoma on his biopsy in May 2020 when his PSA was noted to be 3.25 still. His PSAD was 0.08 and he was again counseled by me and Dr. Tammi Klippel and still wished to proceed with surveillance. His PSA increased to 6.41 in  January 2021 but then declined again but never back to baseline and was 4.21 prior to his most recent biopsy on 08/13/20. This biopsy indicated Gleason 3+4=7 adenocarcinoma again in 2 cores with 1 core also with Gleason 3+3=6 disease. He was noted to have only 10% pattern 4 disease in his Gleason 7 biopsy cores but he was noted to have PNI. His PSA remained above baseline at 4.48 when checked on September 2022.   He does have LUTS. He is currently taking oxybutynin for treatment of urinary urgency and frequency. IPSS is 14.   Family history: None.   Imaging studies: None.   PMH: He has a history of diabetes, hyperlipidemia, and hypertension.  PSH: No abdominal surgeries.   TNM stage: cT1c Nx Mx  PSA: 4.48  Gleason score: 3+4=7 (GG 2)  Biopsy (08/13/20): 3/12 cores positive  Left: L medial apex (10%, 3+4=7), L lateral mid (20%, 3+4=7, PNI),  Right: R mid (5%, 3+3=6)  Prostate volume: 46.1 cc  PSAD: 0.10   Nomogram  OC disease: 66%  EPE: 33%  SVI: 3%  LNI: 3%  PFS (5 year, 10 year): 87%, 78%   Urinary function: IPSS is 14.  Erectile function: SHIM score is 10.     ALLERGIES: Codeine Derivatives    MEDICATIONS: Cialis 20 mg tablet 1 tablet PO PRN  Toviaz 8 mg tablet, extended release 24 hr 1 tablet PO Daily  Multiple Vitamin  Quinapril Hcl 40 mg tablet Oral  Trazodone  Hcl  Turmeric     GU PSH: Prostate Needle Biopsy - 08/13/2020, 2020, 2017       [place] Notes: Elbow Arthroscopy   NON-GU PSH: Elbow Arthroscopy/surgery - 2011     GU PMH: BPH w/LUTS - 03/23/2020, - 2018 Prostate Cancer - 03/23/2020, Prostate cancer, - 2017 Urinary Urgency - 03/23/2020, - 2018 Nocturia - 2018 ED due to arterial insufficiency, Erectile dysfunction due to arterial insufficiency - 2017      PMH Notes:   ** Cannot tolerate biopsies in the office even despite oral sedation (requires IV sedation in OR)   1) Prostate cancer: He was noted to have an increase in his PSA from 1.3 in November 2008 up  to 3.04 in May 2011. His PSA was rechecked when I first evaluated him in June 2011 and remained elevated at 3.53. 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 He has no family history of prostate cancer. He has been thoroughly counseled by myself and Dr. Tammi Klippel about his treatment options and elected active surveillance. He was found to have upgraded but low volume Gleason 3+4=7 disease in May 2020. He was again counseled by Dr. Tammi Klippel and me and elected to continue active surveillance.   Initial diagnosis: July 2011  TNM stage: cT1c Nx Mx  PSA at diagnosis: 3.53  Gleason score: 3+3=6  Biopsy ( 09/04/09): 2/12 cores -- L mid (10%), R lateral mid (5%)  Prostate volume: 37.7 cc  PSAD: 0.09   Surveillance:  Jan 2012: MRI -- L medial apical nodule most suspicious area, No EPE, SVI, or LAD  Jan 2012: 4/22 cores positive -- L lateral apex (<5%, 3+3=6), L mid (2/2 cores, 25% and 5%, 3+3=6, PNI) L lateral mid (<5%, 3+3=6), Vol 38.0 cc  Aug 2013: 1/12 cores positive -- L mid (< 5%), multifocal HGPIN, Vol 39.5 cc  Sep 2015: 2/12 cores -- L mid (10%, 3+3=6, PNI), R lateral base (< 5%, 3+3=6), Vol 33.1 cc  Dec 2017: 1/12 core positive -- L apex (20%, 3+3=6), Vol 31 cc  May 2020: 3/12 cores positive -- L lateral apex (5%, 3+3=6), L apex (20%, 3+3=6), R mid (50%, 3+4=7), Vol 41.0 cc, PSAD 0.08 - elected to continue surveillance    Urinary function (2020): IPSS: 15. He has very little bother from his symptoms which include a weak stream, urgency, and nocturia.  Erectile function (2020): SHIM score 10.   2) BPH/LUTS: His baseline symptoms include frequency, urgency, and nocturia.   Current treatment:  Prior treatment: Vesicare 10 mg (helpful but not covered), Myrbetriq 50 mg   NON-GU PMH: Diabetes Type 2 Hypercholesterolemia Hypertension    FAMILY HISTORY: nephrolithiasis - Father Prostate Cancer - No Family  History   SOCIAL HISTORY: Marital Status: Married Preferred Language: English; Ethnicity: Not Hispanic Or Latino; Race: White Current Smoking Status: Patient does not smoke anymore.   Tobacco Use Assessment Completed: Used Tobacco in last 30 days?     Notes: Former smoker, Marital History - Currently Married, Alcohol Use   REVIEW OF SYSTEMS:    GU Review Male:   Patient denies frequent urination, hard to postpone urination, burning/ pain with urination, get up at night to urinate, leakage of urine, stream starts and stops, trouble starting your streams, and have to strain to urinate .  Gastrointestinal (Upper):   Patient denies nausea and vomiting.  Gastrointestinal (Lower):   Patient denies diarrhea and constipation.  Constitutional:  Patient denies fever, night sweats, weight loss, and fatigue.  Skin:   Patient denies skin rash/ lesion and itching.  Eyes:   Patient denies blurred vision and double vision.  Ears/ Nose/ Throat:   Patient denies sore throat and sinus problems.  Hematologic/Lymphatic:   Patient denies swollen glands and easy bruising.  Cardiovascular:   Patient denies leg swelling and chest pains.  Respiratory:   Patient denies cough and shortness of breath.  Endocrine:   Patient denies excessive thirst.  Musculoskeletal:   Patient denies back pain and joint pain.  Neurological:   Patient denies headaches and dizziness.  Psychologic:   Patient denies depression and anxiety.   VITAL SIGNS: None   MULTI-SYSTEM PHYSICAL EXAMINATION:    Constitutional: Well-nourished. No physical deformities. Normally developed. Good grooming.     Complexity of Data:  Lab Test Review:   PSA  Records Review:   Pathology Reports, Previous Patient Records   11/07/20 03/16/20 08/26/19 06/13/19 02/18/19 03/12/18 12/04/17 08/28/17  PSA  Total PSA 4.48 ng/mL 4.21 ng/mL 5.27 ng/mL 4.21 ng/mL 6.41 ng/mL 3.25 ng/mL 3.88 ng/mL 6.88 ng/mL  Free PSA        1.05 ng/mL  % Free PSA        15 % PSA     PROCEDURES: None   ASSESSMENT:      ICD-10 Details  1 GU:   Prostate Cancer - C61   2   Urinary Urgency - R39.15    PLAN:           Document Letter(s):  Created for Patient: Clinical Summary         Notes:   1. Favorable intermediate risk prostate cancer: I had a detailed discussion with Mr. Carilyn Goodpasture today regarding his management options. At this time, he does wish to switch from active surveillance management to treatment for curative intent. He has met with both Dr. Tammi Klippel and Dr. Alen Blew earlier this afternoon. At this time, he is most interested in proceeding with a radiation seed implant.   The patient was counseled about the natural history of prostate cancer and the standard treatment options that are available for prostate cancer. It was explained to him how his age and life expectancy, clinical stage, Gleason score/prognostic grade group, and PSA (and PSA density) affect his prognosis, the decision to proceed with additional staging studies, as well as how that information influences recommended treatment strategies. We discussed the roles for active surveillance, radiation therapy, surgical therapy, androgen deprivation, as well as ablative therapy and other invesitgational options for the treatment of prostate cancer as appropriate to his individual cancer situation. We discussed the risks and benefits of these options with regard to their impact on cancer control and also in terms of potential adverse events, complications, and impact on quality of life particularly related to urinary and sexual function. The patient was encouraged to ask questions throughout the discussion today and all questions were answered to his stated satisfaction. In addition, the patient was provided with and/or directed to appropriate resources and literature for further education about prostate cancer and treatment options.   After reviewing options, he confirms his decision to proceed with  brachytherapy. We did have a very detailed discussion about his baseline lower urinary tract symptoms and particularly his urgency and frequency and the possibility that the symptoms could be exacerbated by a radiation seed implant. He understands that these symptoms could be acute or potentially chronic. He understands this potential risk and accepts this risk  and still wishes to proceed in this fashion.   He will be scheduled for a radiation seed implantation, cystoscopy, and SpaceOAR hydrogel insertion. At this time, he wishes to proceed after the holidays.   CC: Dorothyann Peng, NP  Dr. Tyler Pita  Dr. Zola Button    E & M CODES: We spent 42 minutes dedicated to evaluation and management time, including face to face interaction, discussions on coordination of care, documentation, result review, and discussion with others as applicable.

## 2020-12-11 NOTE — Progress Notes (Signed)
REFERRING PROVIDER: Raynelle Bring, MD [address] [place],   [address]  PRIMARY PROVIDER:  Dorothyann Peng, NP  PRIMARY REASON FOR VISIT:  1. Malignant neoplasm of prostate ([place])   2. Melanoma of skin ([place])   3. Family history of bladder cancer      HISTORY OF PRESENT ILLNESS:   [name], a 70 y.o. male, was seen for a [facility] cancer genetics consultation at the request of Dr. Alinda Money due to a personal and family history of cancer.  [name] presents to clinic today to discuss the possibility of a hereditary predisposition to cancer, genetic testing, and to further clarify his future cancer risks, as well as potential cancer risks for family members.   In 2013, at the age of 77, [name] was diagnosed with cancer of the prostate.  The treatment plan was watch and wait. In June 2022, he was diagnosed with prostate cancer that was progressing.  Currently his prostate cancer is Stage IIB, Group 2, Gleason Score 3 x 4=7 prostate cancer.   In April 2022, [name] was diagnosed with melanoma. This was excised and he goes back every 3 months.   CANCER HISTORY:  Oncology History  Malignant neoplasm of prostate ([place])  10/18/2014 Initial Diagnosis   Malignant neoplasm of prostate ([place])   08/13/2020 Cancer Staging   Staging form: Prostate, AJCC 8th Edition - Clinical stage from 08/13/2020: Stage IIB (cT1c, cN0, cM0, PSA: 4.5, Grade Group: 2) - Signed by Freeman Caldron, PA-C on 12/11/2020 Histopathologic type: Adenocarcinoma, NOS Stage prefix: Initial diagnosis Prostate specific antigen (PSA) range: Less than 10 Gleason primary pattern: 3 Gleason secondary pattern: 4 Gleason score: 7 Histologic grading system: 5 grade system Number of biopsy cores examined: 12 Number of biopsy cores positive: 3 Location of positive needle core biopsies: Both sides      Past Medical History:  Diagnosis Date   BPH without obstruction/lower urinary tract symptoms     Diet-controlled type 2 diabetes mellitus ([place])    followed by pcp   ED (erectile dysfunction)    GERD (gastroesophageal reflux disease)    no meds   History of 2019 novel coronavirus disease (COVID-19) 2020   per pt asymptomatic   History of melanoma excision 06/2020   per pt localized area mid back   Hypertension    followed by pcp   OA (osteoarthritis)    Persistent atrial fibrillation ([place]) 03/2020   cardiologist-- dr h. Johney Frame---  on xarelto and bb;  work-up includes TTE, CTA, event monitor, and cardiac cath (results in epic),  pt ef 55-656 per cath and echo 55%   Prostate cancer ([place]) 08/2009   urologist--- dr Alinda Money--- first dx 07/ 2011,  Gleason 3+3,  active surveillance since   Wears glasses     Past Surgical History:  Procedure Laterality Date   COLONOSCOPY     last one 2019 approx   ELBOW SURGERY Right 2007   LEFT HEART CATH AND CORONARY ANGIOGRAPHY N/A 05/18/2020   Procedure: LEFT HEART CATH AND CORONARY ANGIOGRAPHY;  Surgeon: Martinique, Peter M, MD;  Location: [facility];  Service: Cardiovascular;  Laterality: N/A;   PROSTATE BIOPSY N/A 01/21/2016   Procedure: BIOPSY TRANSRECTAL ULTRASONIC PROSTATE (TUBP);  Surgeon: Raynelle Bring, MD;  Location: [facility];  Service: Urology;  Laterality: N/A;   PROSTATE BIOPSY N/A 07/15/2018   Procedure: NEEDLE BIOPSY TRANSRECTAL ULTRASONIC PROSTATE (TUBP);  Surgeon: Raynelle Bring, MD;  Location[facility];  Service: Urology;  Laterality: N/A;   PROSTATE BIOPSY N/A 08/13/2020   Procedure: BIOPSY TRANSRECTAL ULTRASONIC PROSTATE (TUBP);  Surgeon: Raynelle Bring, MD;  [facility];  Service: Urology;  Laterality: N/A;    Social History   Socioeconomic History   Marital status: Married    Spouse name: Not on file   Number of children: 1   Years of education: Not on file   Highest education level: Not on file  Occupational History   Not on file  Tobacco Use   Smoking status: Former    Packs/day: 2.00     Years: 35.00    Pack years: 70.00    Types: Cigarettes    Quit date: 2009    Years since quitting: 13.8   Smokeless tobacco: Never  Vaping Use   Vaping Use: Never used  Substance and Sexual Activity   Alcohol use: Yes    Alcohol/week: 42.0 standard drinks    Types: 42 Cans of beer per week    Comment: 6pk beer per day  (12 oz each)   Drug use: Never   Sexual activity: Not on file  Other Topics Concern   Not on file  Social History Narrative   Lives in [place] with wife.   Retired.  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    Two Children who both live locally.       He likes to sleep.    Social Determinants of Health   Financial Resource Strain: Low Risk    Difficulty of Paying Living Expenses: Not hard at all  Food Insecurity: No Food Insecurity   Worried About Charity fundraiser in the Last Year: Never true   [place] in the Last Year: Never true  Transportation Needs: No Transportation Needs   Lack of Transportation (Medical): No   Lack of Transportation (Non-Medical): No  Physical Activity: Sufficiently Active   Days of Exercise per Week: 7 days   Minutes of Exercise per Session: 50 min  Stress: No Stress Concern Present   Feeling of Stress : Not at all  Social Connections: Moderately Isolated   Frequency of Communication with Friends and Family: Three times a week   Frequency of Social Gatherings with Friends and Family: Three times a week   Attends Religious Services: Never   Active Member of Clubs or Organizations: No   Attends Archivist Meetings: Never   Marital Status: Married     FAMILY HISTORY:  We obtained a detailed, 4-generation family history.  Significant diagnoses are listed below: Family History  Problem Relation Age of Onset   Heart disease Mother    Hypertension Mother    Glaucoma Mother    Arthritis Father    Heart disease Father    Hypertension Father    Bladder Cancer  Father 51   Colon polyps Father    Dementia Brother    Lymphoma Cousin    Prostate cancer Cousin        mother's first cousin   Colon cancer Neg Hx    Esophageal cancer Neg Hx    Rectal cancer Neg Hx    Stomach cancer Neg Hx    Breast cancer Neg Hx      The patient has a son and daughter, both cancer free.  His daughter has passed away at age 32.  He has one brother who is 48 and affected with Dementia.  Both parents  are deceased.  The patient's mother died at 36.  She has a sister who is deceased and had a son with lymphoma.  The maternal grandparents are deceased from non-cancer related issues.  The patient's father had bladder cancer at 57.  He had a brother and sister who did not have cancer.  His parents died of non cancer related issues.  [name] is unaware of previous family history of genetic testing for hereditary cancer risks. Patient's maternal ancestors are of [place] descent, and paternal ancestors are of [place] descent. There is no reported Ashkenazi Jewish ancestry. There is no known consanguinity.  GENETIC COUNSELING ASSESSMENT: [name] is a 70 y.o. male with a personal and family history of cancer which is somewhat suggestive of a hereditary cancer syndrome and predisposition to cancer given his two diagnosis of cancer and a family history of cancer. We, therefore, discussed and recommended the following at today's visit.   DISCUSSION: We discussed that, in general, most cancer is not inherited in families, but instead is sporadic or familial. Sporadic cancers occur by chance and typically happen at older ages (>50 years) as this type of cancer is caused by genetic changes acquired during an individual's lifetime. Some families have more cancers than would be expected by chance; however, the ages or types of cancer are not consistent with a known genetic mutation or known genetic mutations have been ruled out. This type of familial cancer is thought to be due  to a combination of multiple genetic, environmental, hormonal, and lifestyle factors. While this combination of factors likely increases the risk of cancer, the exact source of this risk is not currently identifiable or testable.  We discussed that up to 15% of prostate cancer is hereditary, with most cases associated with BRCA mutations.  There are other genes that can be associated with hereditary prostate cancer syndromes.  These include ATM, CHEK2, PALB2, HOXB13 and the Lynch syndrome genes.  We discussed that testing is beneficial for several reasons including knowing how to follow individuals after completing their treatment, identifying whether potential treatment options such as PARP inhibitors would be beneficial, and understand if other family members could be at risk for cancer and allow them to undergo genetic testing.   We reviewed the characteristics, features and inheritance patterns of hereditary cancer syndromes. We also discussed genetic testing, including the appropriate family members to test, the process of testing, insurance coverage and turn-around-time for results. We discussed the implications of a negative, positive, carrier and/or variant of uncertain significant result. We recommended [name] pursue genetic testing for the CancerNext-Expanded+RNAinsight gene panel.   The CancerNext-Expanded gene panel offered by [facility] and includes sequencing and rearrangement analysis for the following 77 genes: AIP, ALK, APC*, ATM*, AXIN2, BAP1, BARD1, BLM, BMPR1A, BRCA1*, BRCA2*, BRIP1*, CDC73, CDH1*, CDK4, CDKN1B, CDKN2A, CHEK2*, CTNNA1, DICER1, FANCC, FH, FLCN, GALNT12, KIF1B, LZTR1, MAX, MEN1, MET, MLH1*, MSH2*, MSH3, MSH6*, MUTYH*, NBN, NF1*, NF2, NTHL1, PALB2*, PHOX2B, PMS2*, POT1, PRKAR1A, PTCH1, PTEN*, RAD51C*, RAD51D*, RB1, RECQL, RET, SDHA, SDHAF2, SDHB, SDHC, SDHD, SMAD4, SMARCA4, SMARCB1, SMARCE1, STK11, SUFU, TMEM127, TP53*, TSC1, TSC2, VHL and XRCC2 (sequencing and  deletion/duplication); EGFR, EGLN1, HOXB13, KIT, MITF, PDGFRA, POLD1, and POLE (sequencing only); EPCAM and GREM1 (deletion/duplication only). DNA and RNA analyses performed for * genes.   Based on [name]'s personal and family history of cancer, he does not quite meets medical criteria for genetic testing. Since he does not meets criteria, he may have an out  of pocket cost, which should not be greater than $100.     PLAN: After considering the risks, benefits, and limitations, [name] provided informed consent to pursue genetic testing and the blood sample was sent to [facility] for analysis of the CancerNext-Expanded+RNAinsight. Results should be available within approximately 2-3 weeks' time, at which point they will be disclosed by telephone to [name], as will any additional recommendations warranted by these results. [name] will receive a summary of his genetic counseling visit and a copy of his results once available. This information will also be available in Epic.   Lastly, we encouraged [name] to remain in contact with cancer genetics annually so that we can continuously update the family history and inform him of any changes in cancer genetics and testing that may be of benefit for this family.   [name]'s questions were answered to his satisfaction today. Our contact information was provided should additional questions or concerns arise. Thank you for the referral and allowing [place] to share in the care of your patient.   James Edwards P. Florene Edwards, [place], [facility] Licensed, Insurance risk surveyor [email or web address] phone: [phone]  The patient was seen for a total of 30 minutes in face-to-face genetic counseling.  The patient brought his wife. This patient was discussed with Drs. Magrinat, Lindi Adie and/or Burr Medico who agrees with the above.    _______________________________________________________________________ For Office Staff:   Number of people involved in session: 2 Was an Intern/ student involved with case: no

## 2020-12-11 NOTE — Progress Notes (Signed)
Radiation Oncology         [phone] ________________________________  Multidisciplinary Prostate Cancer Clinic  Initial Radiation Oncology Consultation  Name: Jacqueline Delapena MRN: [id number]  Date: 12/11/2020  DOB: August 26, 1950  OI:[id number], Tommi Rumps, NP  Raynelle Bring, MD   REFERRING PHYSICIAN: Raynelle Bring, MD  DIAGNOSIS: 70 y.o. gentleman with stage T1c adenocarcinoma of the prostate with a Gleason's score of 3+4 and a PSA of 4.48    ICD-10-CM   1. Prostate cancer [facility])  C61       HISTORY OF PRESENT ILLNESS::Locklan R Ramesses Crampton is a 70 y.o. gentleman. He was initially diagnosed with low volume Gleason 3+3 prostate cancer in 08/2009 with a PSA of 3.01 at that time. He was seen in consultation and ultimately elected to proceed with active surveillance. He has been followed by Dr. Alinda Money since that time, with a prostate MRI in 2012 and several surveillance prostate biopsies showing stable low-risk disease in 2012, 2013, 2015 and 2017. However, on surveillance biopsy in 06/2018, his disease was upgraded to Gleason 3+4 but remained low volume, PSA stable at 3.25 at that time. We met with him again on 08/17/18 to review his treatment options. Given the low volume of disease, he opted to continue in active surveillance.  His PSA has fluctuated over the past year at 6.41 in January 2021, 4.21 in April 2021, 5.27 in July 2021 and back down to 4.21 in January 2022.  Digital rectal exam has remained stable without any concerning nodules.  More recently, Dr. Alinda Money recommended repeat surveillance transrectal ultrasound with 12 biopsies of the prostate which was performed on 08/13/20.  The prostate volume measured 46.1 cc.  Out of 12 core biopsies, 3 were positive.  The maximum Gleason score was 3+4, and this was seen in the left apex medial and left mid lateral (with perineural invasion). Additionally, a small focus of Gleason 3+3 disease was seen in the right mid medial.  His most recent PSA  performed on 11/07/2020 remained stable at 4.48.  The patient reviewed the biopsy results with his urologist and he has kindly been referred today to the multidisciplinary prostate cancer clinic for presentation of pathology and radiology studies in our conference for discussion of potential radiation treatment options and clinical evaluation.  PREVIOUS RADIATION THERAPY: No  PAST MEDICAL HISTORY:  has a past medical history of BPH without obstruction/lower urinary tract symptoms, Diet-controlled type 2 diabetes mellitus ([place]), ED (erectile dysfunction), GERD (gastroesophageal reflux disease), History of 2019 novel coronavirus disease (COVID-19) (2020), History of melanoma excision (06/2020), Hypertension, OA (osteoarthritis), Persistent atrial fibrillation ([place]) (03/2020), Prostate cancer ([place]) (08/2009), and Wears glasses.    PAST SURGICAL HISTORY: Past Surgical History:  Procedure Laterality Date   COLONOSCOPY     last one 2019 approx   ELBOW SURGERY Right 2007   LEFT HEART CATH AND CORONARY ANGIOGRAPHY N/A 05/18/2020   Procedure: LEFT HEART CATH AND CORONARY ANGIOGRAPHY;  Surgeon: Martinique, Peter M, MD;  Location: [facility];  Service: Cardiovascular;  Laterality: N/A;   PROSTATE BIOPSY N/A 01/21/2016   Procedure: BIOPSY TRANSRECTAL ULTRASONIC PROSTATE (TUBP);  Surgeon: Raynelle Bring, MD;  [facility];  Service: Urology;  Laterality: N/A;   PROSTATE BIOPSY N/A 07/15/2018   Procedure: NEEDLE BIOPSY TRANSRECTAL ULTRASONIC PROSTATE (TUBP);  Surgeon: Raynelle Bring, MD;  [facility];  Service: Urology;  Laterality: N/A;   PROSTATE BIOPSY N/A 08/13/2020   Procedure: BIOPSY TRANSRECTAL ULTRASONIC PROSTATE (TUBP);  Surgeon: Raynelle Bring, MD;  Location: [facility];  Service: Urology;  Laterality: N/A;    FAMILY HISTORY: family history includes Arthritis in his father; Bladder Cancer (age of onset: 61) in his father; Colon polyps in his father; Dementia in his  brother; Glaucoma in his mother; Heart disease in his father and mother; Hypertension in his father and mother; Lymphoma in his cousin; Prostate cancer in his cousin.  SOCIAL HISTORY:  reports that he quit smoking about 13 years ago. His smoking use included cigarettes. He has a 70.00 pack-year smoking history. He has never used smokeless tobacco. He reports current alcohol use of about 42.0 standard drinks per week. He reports that he does not use drugs.  ALLERGIES: Codeine  MEDICATIONS:  Current Outpatient Medications  Medication Sig Dispense Refill   acetaminophen (TYLENOL) 500 MG tablet Take 1,000 mg by mouth every 6 (six) hours as needed for moderate pain.     Cyanocobalamin (B-12) 1000 MCG CAPS Take 1 tablet by mouth daily.     losartan (COZAAR) 50 MG tablet Take 1 tablet (50 mg total) by mouth daily. 90 tablet 3   metoprolol succinate (TOPROL XL) 25 MG 24 hr tablet Take 1 tablet (25 mg total) by mouth daily. (Patient taking differently: Take 25 mg by mouth every evening.) 90 tablet 3   metoprolol tartrate (LOPRESSOR) 25 MG tablet Take 1 tablet (25 mg total) by mouth 3 (three) times daily as needed (for break through heart rate greater than 100.). 90 tablet 2   Multiple Vitamin (MULTIVITAMIN) tablet Take 1 tablet by mouth daily.     nitroGLYCERIN (NITROSTAT) 0.4 MG SL tablet Place 1 tablet (0.4 mg total) under the tongue every 5 (five) minutes as needed for chest pain. 30 tablet 3   OVER THE COUNTER MEDICATION Take 9 tablets by mouth daily. Gin Raisin for joint health     oxybutynin (DITROPAN-XL) 10 MG 24 hr tablet Take 10 mg by mouth at bedtime.     rivaroxaban (XARELTO) 20 MG TABS tablet Take 1 tablet (20 mg total) by mouth daily with supper. 28 tablet 0   rosuvastatin (CRESTOR) 20 MG tablet Take 1 tablet (20 mg total) by mouth daily. 90 tablet 3   traZODone (DESYREL) 50 MG tablet TAKE 1 TABLET (50 MG TOTAL) BY MOUTH AT BEDTIME AS NEEDED. 90 tablet 1   trolamine salicylate (ASPERCREME)  10 % cream Apply 1 application topically as needed for muscle pain.     Turmeric 1053 MG TABS Take 3 tablets by mouth daily.     No current facility-administered medications for this encounter.    REVIEW OF SYSTEMS:  On review of systems, the patient reports that he is doing well overall. He denies any chest pain, shortness of breath, cough, fevers, chills, night sweats, unintended weight changes. He denies any bowel disturbances, and denies abdominal pain, nausea or vomiting. He denies any new musculoskeletal or joint aches or pains. His IPSS was 14, indicating moderate urinary symptoms. His SHIM was 10, indicating he has moderate to severe erectile dysfunction. A complete review of systems is obtained and is otherwise negative.   PHYSICAL EXAM:  Wt Readings from Last 3 Encounters:  12/11/20 192 lb 9.6 oz (87.4 kg)  08/21/20 193 lb (87.5 kg)  08/13/20 188 lb 1.6 oz (85.3 kg)   Temp Readings from Last 3 Encounters:  12/11/20 97.8 F (36.6 C)  08/21/20 97.9 F (36.6 C) (Temporal)  08/13/20 98.6 F (37 C)   BP Readings from Last 3 Encounters:  12/11/20 135/77  08/21/20 136/62  08/13/20 (!) 147/90   Pulse Readings from Last 3 Encounters:  12/11/20 (!) 107  08/21/20 74  08/13/20 72    /10  In general this is a well appearing Caucasian male in no acute distress. He's alert and oriented x4 and appropriate throughout the examination. Cardiopulmonary assessment is negative for acute distress and he exhibits normal effort.    KPS = 90  100 - Normal; no complaints; no evidence of disease. 90   - Able to carry on normal activity; minor signs or symptoms of disease. 80   - Normal activity with effort; some signs or symptoms of disease. 26   - Cares for self; unable to carry on normal activity or to do active work. 60   - Requires occasional assistance, but is able to care for most of his personal needs. 50   - Requires considerable assistance and frequent medical care. 29   -  Disabled; requires special care and assistance. 5   - Severely disabled; hospital admission is indicated although death not imminent. 42   - Very sick; hospital admission necessary; active supportive treatment necessary. 10   - Moribund; fatal processes progressing rapidly. 0     - Dead  Karnofsky DA, Abelmann [place], Craver LS and Burchenal [facility] [phone]) The use of the nitrogen mustards in the palliative treatment of carcinoma: with particular reference to bronchogenic carcinoma Cancer 1 634-56   LABORATORY DATA:  Lab Results  Component Value Date   WBC 7.0 05/16/2020   HGB 13.5 05/16/2020   HCT 40.3 05/16/2020   MCV 96 05/16/2020   PLT 220 05/16/2020   Lab Results  Component Value Date   NA 136 08/13/2020   K 4.2 08/13/2020   CL 106 08/13/2020   CO2 23 08/13/2020   Lab Results  Component Value Date   ALT 14 05/24/2019   AST 17 05/24/2019   ALKPHOS 26 (L) 05/24/2019   BILITOT 0.6 05/24/2019     RADIOGRAPHY: No results found.    IMPRESSION/PLAN: 70 y.o. gentleman with Stage T1c adenocarcinoma of the prostate with a Gleason score of 3+4 and a PSA of 4.48.    We discussed the patient's workup and outlined the nature of prostate cancer in this setting. The patient's T stage, Gleason's score, and PSA put him into the favorable intermediate risk group. Accordingly, he is eligible for a variety of potential treatment options including brachytherapy, 5.5 weeks of external radiation, or prostatectomy. We discussed the available radiation techniques, and focused on the details and logistics of delivery. We discussed and outlined the risks, benefits, short and long-term effects associated with radiotherapy and compared and contrasted these with prostatectomy. We discussed the role of SpaceOAR gel in reducing the rectal toxicity associated with radiotherapy. He appears to have a good understanding of his disease and our treatment recommendations which are of curative intent.  He was encouraged to  ask questions that were answered to his/their stated satisfaction.  At the end of the conversation the patient is interested in moving forward with brachytherapy and use of SpaceOAR gel to reduce rectal toxicity from radiotherapy.  We will share our discussion with Dr. Alinda Money and move forward with scheduling his [facility] planning appointment in the near future.  The patient met briefly with Romie Jumper in our office who will be working closely with him to coordinate OR scheduling and pre and post procedure appointments.  We will contact the pharmaceutical rep to ensure that [facility]  is available at the time of procedure.  We enjoyed meeting him today and look forward to continuing to participate in his care.  We personally spent 60 minutes in this encounter including chart review, reviewing radiological studies, meeting face-to-face with the patient, entering orders and completing documentation.    Nicholos Johns, PA-C    Tyler Pita, MD  [facility] Direct Dial: [phone]  Fax: [phone] [email or web address]  Skype  [email or web address]   This document serves as a record of services personally performed by Tyler Pita, MD and Freeman Caldron, PA-C. It was created on their behalf by Wilburn Mylar, a trained medical scribe. The creation of this record is based on the scribe's personal observations and the provider's statements to them. This document has been checked and approved by the attending provider.

## 2020-12-11 NOTE — Progress Notes (Signed)
Reason for the request:    Prostate cancer  HPI: I was asked by Dr. Alinda Money to evaluate James Edwards for the evaluation of prostate cancer.  He is a 70 year old man with history of prostate cancer dating back to 2011.  At that time he had a PSA of 3.5 and biopsy at that time showed a Gleason score of 6.  He had been on active surveillance at this time and has not required treatment.  He had multiple biopsies over the last 10 years including 2013 which showed a Gleason score 6 and in 2020 he did have a Gleason score 7 only 10% limited core.  His most recent biopsy done in June 2022 after the last PSA of 4.21 in January 2022.  His biopsy showed Gleason score 3+4 equal 7 with pattern 4 only in 10% of 3 separate cores.  Clinically, he reports occasional difficulties with voiding symptoms including nocturia and frequency and has been on multiple treatments previously.  He denies any incontinence or hematuria.  He remains reasonably active without any other complaints.  He does not report any headaches, blurry vision, syncope or seizures. Does not report any fevers, chills or sweats.  Does not report any cough, wheezing or hemoptysis.  Does not report any chest pain, palpitation, orthopnea or leg edema.  Does not report any nausea, vomiting or abdominal pain.  Does not report any constipation or diarrhea.  Does not report any skeletal complaints.    Does not report frequency, urgency or hematuria.  Does not report any skin rashes or lesions. Does not report any heat or cold intolerance.  Does not report any lymphadenopathy or petechiae.  Does not report any anxiety or depression.  Remaining review of systems is negative.     Past Medical History:  Diagnosis Date   BPH without obstruction/lower urinary tract symptoms    Diet-controlled type 2 diabetes mellitus ([place])    followed by pcp   ED (erectile dysfunction)    GERD (gastroesophageal reflux disease)    no meds   History of 2019 novel coronavirus  disease (COVID-19) 2020   per pt asymptomatic   History of melanoma excision 06/2020   per pt localized area mid back   Hypertension    followed by pcp   OA (osteoarthritis)    Persistent atrial fibrillation ([place]) 03/2020   cardiologist-- dr h. Johney Frame---  on xarelto and bb;  work-up includes TTE, CTA, event monitor, and cardiac cath (results in epic),  pt ef 55-656 per cath and echo 55%   Prostate cancer ([place]) 08/2009   urologist--- dr Alinda Money--- first dx 07/ 2011,  Gleason 3+3,  active surveillance since   Wears glasses   :   Past Surgical History:  Procedure Laterality Date   COLONOSCOPY     last one 2019 approx   ELBOW SURGERY Right 2007   LEFT HEART CATH AND CORONARY ANGIOGRAPHY N/A 05/18/2020   Procedure: LEFT HEART CATH AND CORONARY ANGIOGRAPHY;  Surgeon: Martinique, Peter M, MD;  Location: [facility];  Service: Cardiovascular;  Laterality: N/A;   PROSTATE BIOPSY N/A 01/21/2016   Procedure: BIOPSY TRANSRECTAL ULTRASONIC PROSTATE (TUBP);  Surgeon: Raynelle Bring, MD;  Location: [facility];  Service: Urology;  Laterality: N/A;   PROSTATE BIOPSY N/A 07/15/2018   Procedure: NEEDLE BIOPSY TRANSRECTAL ULTRASONIC PROSTATE (TUBP);  Surgeon: Raynelle Bring, MD;  Location: [facility];  Service: Urology;  Laterality: N/A;   PROSTATE BIOPSY N/A 08/13/2020   Procedure: BIOPSY TRANSRECTAL ULTRASONIC PROSTATE (TUBP);  Surgeon: Raynelle Bring, MD;  Location: [facility];  Service: Urology;  Laterality: N/A;  :   Current Outpatient Medications:    acetaminophen (TYLENOL) 500 MG tablet, Take 1,000 mg by mouth every 6 (six) hours as needed for moderate pain., Disp: , Rfl:    Cyanocobalamin (B-12) 1000 MCG CAPS, Take 1 tablet by mouth daily., Disp: , Rfl:    losartan (COZAAR) 50 MG tablet, Take 1 tablet (50 mg total) by mouth daily., Disp: 90 tablet, Rfl: 3   metoprolol succinate (TOPROL XL) 25 MG 24 hr tablet, Take 1 tablet (25 mg total) by mouth daily. (Patient taking differently:  Take 25 mg by mouth every evening.), Disp: 90 tablet, Rfl: 3   metoprolol tartrate (LOPRESSOR) 25 MG tablet, Take 1 tablet (25 mg total) by mouth 3 (three) times daily as needed (for break through heart rate greater than 100.)., Disp: 90 tablet, Rfl: 2   Multiple Vitamin (MULTIVITAMIN) tablet, Take 1 tablet by mouth daily., Disp: , Rfl:    nitroGLYCERIN (NITROSTAT) 0.4 MG SL tablet, Place 1 tablet (0.4 mg total) under the tongue every 5 (five) minutes as needed for chest pain., Disp: 30 tablet, Rfl: 3   OVER THE COUNTER MEDICATION, Take 9 tablets by mouth daily. Gin Raisin for joint health, Disp: , Rfl:    oxybutynin (DITROPAN-XL) 10 MG 24 hr tablet, Take 10 mg by mouth at bedtime., Disp: , Rfl:    rivaroxaban (XARELTO) 20 MG TABS tablet, Take 1 tablet (20 mg total) by mouth daily with supper., Disp: 28 tablet, Rfl: 0   rosuvastatin (CRESTOR) 20 MG tablet, Take 1 tablet (20 mg total) by mouth daily., Disp: 90 tablet, Rfl: 3   traZODone (DESYREL) 50 MG tablet, TAKE 1 TABLET (50 MG TOTAL) BY MOUTH AT BEDTIME AS NEEDED., Disp: 90 tablet, Rfl: 1   trolamine salicylate (ASPERCREME) 10 % cream, Apply 1 application topically as needed for muscle pain., Disp: , Rfl:    Turmeric 1053 MG TABS, Take 3 tablets by mouth daily., Disp: , Rfl: :   Allergies  Allergen Reactions   Codeine Nausea And Vomiting  :   Family History  Problem Relation Age of Onset   Arthritis Father    Heart disease Father    Hypertension Father    Bladder Cancer Father    Colon polyps Father    Cancer Father        bladder   Heart disease Mother    Hypertension Mother    Bladder Cancer Mother    Glaucoma Mother    Dementia Brother    Colon cancer Neg Hx    Esophageal cancer Neg Hx    Rectal cancer Neg Hx    Stomach cancer Neg Hx    Breast cancer Neg Hx    Prostate cancer Neg Hx   :   Social History   Socioeconomic History   Marital status: Married    Spouse name: Not on file   Number of children: 1   Years  of education: Not on file   Highest education level: Not on file  Occupational History   Not on file  Tobacco Use   Smoking status: Former    Packs/day: 2.00    Years: 35.00    Pack years: 70.00    Types: Cigarettes    Quit date: 2009    Years since quitting: 13.8   Smokeless tobacco: Never  Vaping Use   Vaping Use: Never used  Substance and Sexual Activity  Alcohol use: Yes    Alcohol/week: 42.0 standard drinks    Types: 42 Cans of beer per week    Comment: 6pk beer per day  (12 oz each)   Drug use: Never   Sexual activity: Not on file  Other Topics Concern   Not on file  Social History Narrative   Lives in [place] with wife.   Retired.  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    Two Children who both live locally.       He likes to sleep.    Social Determinants of Health   Financial Resource Strain: Low Risk    Difficulty of Paying Living Expenses: Not hard at all  Food Insecurity: No Food Insecurity   Worried About Charity fundraiser in the Last Year: Never true   [place] in the Last Year: Never true  Transportation Needs: No Transportation Needs   Lack of Transportation (Medical): No   Lack of Transportation (Non-Medical): No  Physical Activity: Sufficiently Active   Days of Exercise per Week: 7 days   Minutes of Exercise per Session: 50 min  Stress: No Stress Concern Present   Feeling of Stress : Not at all  Social Connections: Moderately Isolated   Frequency of Communication with Friends and Family: Three times a week   Frequency of Social Gatherings with Friends and Family: Three times a week   Attends Religious Services: Never   Active Member of Clubs or Organizations: No   Attends Archivist Meetings: Never   Marital Status: Married  Human resources officer Violence: Not At Risk   Fear of Current or Ex-Partner: No   Emotionally Abused: No   Physically Abused: No   Sexually Abused: No   :  Pertinent items are noted in HPI.  Exam: ECOG 0 General appearance: alert and cooperative appeared without distress. Head: atraumatic without any abnormalities. Eyes: conjunctivae/corneas clear. PERRL.  Sclera anicteric. Throat: lips, mucosa, and tongue normal; without oral thrush or ulcers. Resp: clear to auscultation bilaterally without rhonchi, wheezes or dullness to percussion. Cardio: regular rate and rhythm, S1, S2 normal, no murmur, click, rub or gallop GI: soft, non-tender; bowel sounds normal; no masses,  no organomegaly Skin: Skin color, texture, turgor normal. No rashes or lesions Lymph nodes: Cervical, supraclavicular, and axillary nodes normal. Neurologic: Grossly normal without any motor, sensory or deep tendon reflexes. Musculoskeletal: No joint deformity or effusion.    Assessment and Plan:    70 year old with prostate cancer diagnosed in 2011.  He presented with a Gleason score 6 and a PSA of 3.5.  He has been on active surveillance since that time.  Over the last 10 years he had multiple biopsies and in 2020 showed an increase of a pattern 4 prostate cancer in 10% of his core.  Repeat biopsy in 2022 showed 2 cores with 10% each of pattern 4.  His case was discussed today in the prostate cancer multidisciplinary clinic including review of his pathology results and discussion with the reviewing pathologist.  Treatment choices were also discussed today with the patient.  His options include continued active surveillance versus definitive treatment.  Definitive treatment including primary surgical therapy versus radiation were also reviewed.  Risks and benefits of all these options were reiterated and the role for systemic therapy was discussed which has deferred at this time.   Overall he is inclined to proceed with definitive treatment at this  time and will consider both options of surgery and radiation.  All his questions were answered to her satisfaction.  45   minutes were dedicated to this visit. The time was spent on reviewing laboratory data, imaging studies, discussing treatment options, and answering questions regarding future plan.     A copy of this consult has been forwarded to the requesting physician.

## 2020-12-11 NOTE — Progress Notes (Signed)
                               Care Plan Summary  Name:  DOB:    Your Medical Team:   Urologist -  Dr. Raynelle Bring, [facility]  Radiation Oncologist - Dr. Tyler Pita, [facility]   Medical Oncologist - Dr. Zola Button, [place]  Recommendations: 1)  Seed Implant (Brachytherapy)    * These recommendations are based on information available as of today's consult.      Recommendations may change depending on the results of further tests or exams.    Next Steps: 1) Dr. Johny Shears office will contact you to set up appointment for seed implant.   When appointments need to be scheduled, you will be contacted by [facility] and/or [facility].  Questions?  Please do not hesitate to call Katheren Puller, RN, BSN, at [phone] any questions or concerns.  Kathlee Nations is your Oncology Nurse Navigator and is available to assist you while you're receiving your medical care at [facility].

## 2020-12-13 ENCOUNTER — Telehealth: Payer: Self-pay | Admitting: *Deleted

## 2020-12-13 NOTE — Telephone Encounter (Signed)
CALLED PATIENT TO ASK QUESTIONS, SPOKE WITH PATIENT 

## 2020-12-17 ENCOUNTER — Telehealth: Payer: Self-pay | Admitting: *Deleted

## 2020-12-17 ENCOUNTER — Telehealth: Payer: Self-pay | Admitting: Pharmacist

## 2020-12-17 DIAGNOSIS — Z23 Encounter for immunization: Secondary | ICD-10-CM | POA: Diagnosis not present

## 2020-12-17 NOTE — Chronic Care Management (AMB) (Signed)
    Chronic Care Management Pharmacy Assistant   Name: James Edwards  MRN: [id number] DOB: 15-Mar-1950  12/17/20 APPOINTMENT REMINDER   Called Patient No answer, left message of appointment on 12/18/20 at 12 via telephone visit with [name], Pharm D.   Notified to have all medications, supplements, blood pressure and/or blood sugar logs available during appointment and to return call if need to reschedule.  Care Gaps: Urine Micro - Overdue TDAP- Overdue Zoster Vaccine - Overdue Eye Exam - Overdue COVID Booster #4 Levan Hurst) - Overdue Foot Exam - Overdue Flu Vaccine - Overdue AVW- 9/22 CCM F /U Call - Scheduled for 11/22 at 12  Lab Results  Component Value Date   HGBA1C 5.8 05/24/2019   Star Rating Drug:  Pravastatin (Pravachol) 20 mg - Last filled 06/21/2020 90 DS at [facility] Rosuvastatin (Crestor) 20 mg - Last filled 10/10/2020 90 DS at [facility] Losartan (Cozaar) 50 mg - Last filled 09/27/2020 90 DS at [facility]  Any gaps in medications fill history?  Yes, Call [facility] and [facility] to verify dates above as accurate   Medications: Outpatient Encounter Medications as of 12/17/2020  Medication Sig   acetaminophen (TYLENOL) 500 MG tablet Take 1,000 mg by mouth every 6 (six) hours as needed for moderate pain.   Cyanocobalamin (B-12) 1000 MCG CAPS Take 1 tablet by mouth daily.   losartan (COZAAR) 50 MG tablet Take 1 tablet (50 mg total) by mouth daily.   metoprolol succinate (TOPROL XL) 25 MG 24 hr tablet Take 1 tablet (25 mg total) by mouth daily. (Patient taking differently: Take 25 mg by mouth every evening.)   metoprolol tartrate (LOPRESSOR) 25 MG tablet Take 1 tablet (25 mg total) by mouth 3 (three) times daily as needed (for break through heart rate greater than 100.).   Multiple Vitamin (MULTIVITAMIN) tablet Take 1 tablet by mouth daily.   nitroGLYCERIN (NITROSTAT) 0.4 MG SL tablet Place 1 tablet (0.4 mg total) under the tongue every 5 (five) minutes as needed for chest  pain.   OVER THE COUNTER MEDICATION Take 9 tablets by mouth daily. Gin Raisin for joint health   oxybutynin (DITROPAN-XL) 10 MG 24 hr tablet Take 10 mg by mouth at bedtime.   rivaroxaban (XARELTO) 20 MG TABS tablet Take 1 tablet (20 mg total) by mouth daily with supper.   rosuvastatin (CRESTOR) 20 MG tablet Take 1 tablet (20 mg total) by mouth daily.   traZODone (DESYREL) 50 MG tablet TAKE 1 TABLET (50 MG TOTAL) BY MOUTH AT BEDTIME AS NEEDED.   trolamine salicylate (ASPERCREME) 10 % cream Apply 1 application topically as needed for muscle pain.   Turmeric 1053 MG TABS Take 3 tablets by mouth daily.   No facility-administered encounter medications on file as of 12/17/2020.    [place] Clinical Pharmacist Assistant [phone]

## 2020-12-17 NOTE — Telephone Encounter (Signed)
Called patient to update, lvm for a return call 

## 2020-12-18 ENCOUNTER — Ambulatory Visit (INDEPENDENT_AMBULATORY_CARE_PROVIDER_SITE_OTHER): Payer: Medicare Other | Admitting: Pharmacist

## 2020-12-18 DIAGNOSIS — I10 Essential (primary) hypertension: Secondary | ICD-10-CM

## 2020-12-18 DIAGNOSIS — I4819 Other persistent atrial fibrillation: Secondary | ICD-10-CM

## 2020-12-18 NOTE — Patient Instructions (Signed)
Hi [name],  It was great to catch up with you again! I hope the procedure goes well. Don't forget to look into getting your shingles shot in the new year.  Please reach out to me if you have any questions or need anything before our follow up!  Best, Maddie  [name], PharmD, James Edwards    Visit Information   Goals Addressed   None    Patient Care Plan: CCM Pharmacy Care Plan     Problem Identified: Problem: Hypertension, Hyperlipidemia, Diabetes, Atrial Fibrillation and insomnia and history of prostate cancer      Long-Range Goal: Patient-Specific Goal   Start Date: 04/02/2020  Expected End Date: 04/02/2021  Recent Progress: On track  Priority: High  Note:   Current Barriers:  Unable to independently monitor therapeutic efficacy Unable to achieve control of cholesterol   Pharmacist Clinical Goal(s):  Patient will verbalize ability to afford treatment regimen achieve control of cholesterol as evidenced by lipid panel  through collaboration with PharmD and provider.   Interventions: 1:1 collaboration with James Peng, NP regarding development and update of comprehensive plan of care as evidenced by provider attestation and co-signature Inter-disciplinary care team collaboration (see longitudinal plan of care) Comprehensive medication review performed; medication list updated in electronic medical record  Hypertension (BP goal <140/90) -Controlled -Current treatment: Metoprolol tartrate 25 mg 1 tablet twice daily -Medications previously tried: quinapril (switched for Afib) -Current home readings: 137/78 HR 87, 146/90, 134/90 HR 101, 141/85, 128/75 -Current dietary habits: Patient uses some salt when cooking but is conscious of salt content and uses sea salt -Current exercise habits: 2 mile walk each morning -Denies hypotensive/hypertensive symptoms -Educated on Exercise goal of 150 minutes per  week; Importance of home blood pressure monitoring; -Counseled to monitor BP at home a couple times a week, document, and provide log at future appointments -Recommended to continue current medication  Hyperlipidemia: (LDL goal < 100) -Uncontrolled -Current treatment: Rosuvastatin 20 mg 1 tablet daily  -Medications previously tried: atorvastatin (fatigue/foggy), pravastatin -Current dietary patterns: drinks about 7-10 beers every day and has switched from IPA to light beer -Current exercise habits: walking 2 miles a day -Educated on Cholesterol goals;  Benefits of statin for ASCVD risk reduction; Importance of limiting foods high in cholesterol; Exercise goal of 150 minutes per week; -Recommended to continue current medication  Atrial Fibrillation (Goal: prevent stroke and major bleeding) -Controlled -CHADSVASC: 3 -Current treatment: Rate control: metoprolol tartrate 25 mg 1 tablet three times daily as needed; metoprolol succinate 25 mg 1 tablet every evening Anticoagulation: Xarelto 20 mg 1 tablet with dinner -Medications previously tried: none -Home BP and HR readings: unable to provide  -Counseled on bleeding risk associated with Xarelto and importance of self-monitoring for signs/symptoms of bleeding; avoidance of NSAIDs due to increased bleeding risk with anticoagulants; -Recommended to continue current medication Assessed patient finances. Qualifies for [employer].  Insomnia (Goal: improve quality and quantity of sleep) -Controlled -Current treatment  Trazodone 50 mg 1 tablet at bedtime as needed -Medications previously tried: none  -Recommended to continue current medication Counseled on practicing good sleep hygiene by setting a sleep schedule and maintaining it, avoid excessive napping, following a nightly routine, avoiding screen time for 30-60 minutes before going to bed, and making the bedroom a cool, quiet and dark space   History of prostate cancer (Goal:  minimize symptoms of urinary urgency/frequency) -Uncontrolled -Current treatment  Oxybutynin ER 10 mg 1 tablet every evening -Medications  previously tried: none  -Recommended to discuss with urologist about switching Oxybutynin to more effective alternative  Health Maintenance -Vaccine gaps: shingles, tetanus -Current therapy:  Multivitamin 1 tablet daily Gin raisin 8-9 raisins (anti-inflammatory for thumbs, knees) Turmeric 400 mg 1 tablet daily (feet pain) -Educated on Cost vs benefit of each product must be carefully weighed by individual consumer -Patient is satisfied with current therapy and denies issues -Recommended to continue current medication  Patient Goals/Self-Care Activities Patient will:  - take medications as prescribed check blood pressure a few times a week, document, and provide at future appointments  Follow Up Plan: Telephone follow up appointment with care management team member scheduled for: 6 months       Patient verbalizes understanding of instructions provided today and agrees to view in [place].  Telephone follow up appointment with pharmacy team member scheduled for: 6 months  James Edwards, [facility]

## 2020-12-18 NOTE — Progress Notes (Signed)
Chronic Care Management Pharmacy Note  12/18/2020 Name:  Tobey Schmelzle MRN:  [id number] DOB:  November 16, 1950  Summary: LDL not at goal < 100 HR is variable and pt takes 2-3 metoprolol per day  Recommendations/Changes made from today's visit: -Recommend repeat lipid panel -Recommended making cardiology aware of consistent need for additional metoprolol  Plan: Follow up BP assessment in 3 months  Subjective: Aarush Stukey is an 70 y.o. year old male who is a primary patient of Dorothyann Peng, NP.  The CCM team was consulted for assistance with disease management and care coordination needs.    Engaged with patient by telephone for follow up visit in response to provider referral for pharmacy case management and/or care coordination services.   Consent to Services:  The patient was given information about Chronic Care Management services, agreed to services, and gave verbal consent prior to initiation of services.  Please see initial visit note for detailed documentation.   Patient Care Team: Dorothyann Peng, NP as PCP - General (Family Medicine) Freada Bergeron, MD as PCP - Cardiology (Cardiology) Thompson Grayer, MD as PCP - Electrophysiology (Clinical Cardiac Electrophysiology) [facility], [facility] Viona Gilmore, [facility] as Pharmacist (Pharmacist) Katheren Puller, RN as Oncology Nurse Navigator  Recent office visits: 11/15/20 Randel Pigg, LPN: Patient presented for medicare annual wellness visit.  03/27/20 Dorothyann Peng, NP: Patient presented for new onset Afib. EKG confirmed. Prescribed metoprolol tartrate 25 mg BID and Xarelto 20 mg daily with supper and d/c'd quinapril.  Recent consult visits: 12/11/20 Roma Kayser (oncology): Patient presented for generic counseling for cancer.  12/11/20 Zola Button, MD (oncology): Patient presented for prostate cancer follow up. Patient will consider radiation and surgery.  08/21/20 Deneise Lever, MD (Pulmonology) -  Patient presented for snoring. Home sleep test ordered. No medication changes.   08/13/20 Raynelle Bring MD (Radiology) - Patient presented for Prostate biopsy. No medication changes.   07/19/20 Freada Bergeron, MD (Cardiology) - Patient presented for persistent atrial fibrillation and other concerns. Prescribed Metoprolol Succinate  25 mg daily. Changed Metoprolol Tartrate 25 mg to 3 times daily PRN.   02/25/19 Raynelle Bring (urology): Patient presented for follow up for BPH and hx of prostate cancer. Unable to access notes.  Hospital visits: Medication Reconciliation was completed by comparing discharge summary, patient's EMR and Pharmacy list, and upon discussion with patient.   Patient visited [facility] on 6-27-200 for Prostate  Biopsy. He was there for 3 hours.     New?Medications Started at [facility] Discharge:?? -started None   Medication Changes at Hospital Discharge: -Changed None   Medications Discontinued at Hospital Discharge: -Stopped None   Medications that remain the same after Hospital Discharge:??  -All other medications will remain the same.    Objective:  Lab Results  Component Value Date   CREATININE 0.83 08/13/2020   BUN 12 08/13/2020   GFR 73.59 03/27/2020   GFRNONAA >60 08/13/2020   GFRAA >60 07/09/2018   NA 136 08/13/2020   K 4.2 08/13/2020   CALCIUM 10.5 (H) 08/13/2020   CO2 23 08/13/2020    Lab Results  Component Value Date/Time   HGBA1C 5.8 05/24/2019 08:19 AM   HGBA1C 5.4 07/09/2018 09:44 AM   GFR 73.59 03/27/2020 02:37 PM   GFR 89.20 05/24/2019 08:19 AM    Last diabetic Eye exam: No results found for: HMDIABEYEEXA  Last diabetic Foot exam: No results found for: HMDIABFOOTEX   Lab Results  Component Value Date  CHOL 211 (H) 05/24/2019   HDL 66.30 05/24/2019   LDLCALC 125 (H) 05/24/2019   LDLDIRECT 97.0 01/03/2016   TRIG 97.0 05/24/2019   CHOLHDL 3 05/24/2019    Hepatic Function Latest Ref Rng & Units 05/24/2019  03/05/2018 01/02/2017  Total Protein 6.0 - 8.3 g/dL 6.6 6.9 7.0  Albumin 3.5 - 5.2 g/dL 4.1 4.3 4.2  AST 0 - 37 U/L _0 ALT 0 - 53 U/L _1 Alk Phosphatase 39 - 117 U/L 26(L) 26(L) 30(L)  Total Bilirubin 0.2 - 1.2 mg/dL 0.6 0.7 1.0  Bilirubin, Direct 0.0 - 0.3 mg/dL - - 0.2    Lab Results  Component Value Date/Time   TSH 1.03 03/27/2020 02:37 PM   TSH 1.12 05/24/2019 08:19 AM    CBC Latest Ref Rng & Units 05/16/2020 05/24/2019 07/09/2018  WBC 3.4 - 10.8 x10E3/uL 7.0 6.7 7.6  Hemoglobin 13.0 - 17.7 g/dL 13.5 13.7 14.3  Hematocrit 37.5 - 51.0 % 40.3 40.2 42.6  Platelets 150 - 450 x10E3/uL 220 238.0 255    No results found for: VD25OH  Clinical ASCVD: No  The 10-year ASCVD risk score (Arnett DK, et al., 2019) is: 38.4%   Values used to calculate the score:     Age: 82 years     Sex: Male     Is Non-Hispanic African American: No     Diabetic: Yes     Tobacco smoker: No     Systolic Blood Pressure: 037 mmHg     Is BP treated: Yes     HDL Cholesterol: 66.3 mg/dL     Total Cholesterol: 211 mg/dL    Depression screen [facility] 2/9 11/15/2020 10/26/2019 05/24/2019  Decreased Interest 0 0 0  Down, Depressed, Hopeless 0 0 0  PHQ - 2 Score 0 0 0  Altered sleeping - 0 -  Tired, decreased energy - 0 -  Change in appetite - 0 -  Feeling bad or failure about yourself  - 0 -  Trouble concentrating - 0 -  Moving slowly or fidgety/restless - 0 -  Suicidal thoughts - 0 -  PHQ-9 Score - 0 -  Difficult doing work/chores - Not difficult at all -    CHA2DS2/VAS Stroke Risk Points  Current as of 3 days ago     3 >= 2 Points: High Risk  1 - 1.99 Points: Medium Risk  0 Points: Low Risk    No Change      Details    This score determines the patient's risk of having a stroke if the  patient has atrial fibrillation.       Points Metrics  0 Has Congestive Heart Failure:  No    Current as of 3 days ago  0 Has Vascular Disease:  No    Current as of 3 days ago  1 Has Hypertension:  Yes     Current as of 3 days ago  1 Age:  60    Current as of 3 days ago  1 Has Diabetes:  Yes    Current as of 3 days ago  0 Had Stroke:  No  Had TIA:  No  Had Thromboembolism:  No    Current as of 3 days ago  0 Male:  No    Current as of 3 days ago      Social History   Tobacco Use  Smoking Status Former   Packs/day: 2.00   Years: 35.00   Pack years:  70.00   Types: Cigarettes   Quit date: 2009   Years since quitting: 13.8  Smokeless Tobacco Never   BP Readings from Last 3 Encounters:  12/11/20 135/77  08/21/20 136/62  08/13/20 (!) 147/90   Pulse Readings from Last 3 Encounters:  12/11/20 (!) 107  08/21/20 74  08/13/20 72   Wt Readings from Last 3 Encounters:  12/11/20 192 lb 9.6 oz (87.4 kg)  08/21/20 193 lb (87.5 kg)  08/13/20 188 lb 1.6 oz (85.3 kg)    Assessment/Interventions: Review of patient past medical history, allergies, medications, health status, including review of consultants reports, laboratory and other test data, was performed as part of comprehensive evaluation and provision of chronic care management services.   SDOH:  (Social Determinants of Health) assessments and interventions performed: Yes   CCM Care Plan  Allergies  Allergen Reactions   Codeine Nausea And Vomiting    Medications Reviewed Today     Reviewed by Viona Gilmore, [facility] (Pharmacist) on 12/18/20 at 30  Med List Status: <None>   Medication Order Taking? Sig Documenting Provider Last Dose Status Informant  acetaminophen (TYLENOL) 500 MG tablet [id number]  Take 1,000 mg by mouth every 6 (six) hours as needed for moderate pain. [provider]  Active Self  Cyanocobalamin (B-12) 1000 MCG CAPS [id number]  Take 1 tablet by mouth daily. [provider]  Active Self  losartan (COZAAR) 50 MG tablet [id number] Yes Take 1 tablet (50 mg total) by mouth daily. Freada Bergeron, MD Taking Active   metoprolol succinate (TOPROL XL) 25 MG 24 hr tablet [id number]  Take 1  tablet (25 mg total) by mouth daily.  Patient taking differently: Take 25 mg by mouth every evening.   Freada Bergeron, MD  Active Self  metoprolol tartrate (LOPRESSOR) 25 MG tablet [id number]  Take 1 tablet (25 mg total) by mouth 3 (three) times daily as needed (for break through heart rate greater than 100.). Freada Bergeron, MD  Active Self  Multiple Vitamin (MULTIVITAMIN) tablet [id number]  Take 1 tablet by mouth daily. [provider]  Active Self  nitroGLYCERIN (NITROSTAT) 0.4 MG SL tablet [id number]  Place 1 tablet (0.4 mg total) under the tongue every 5 (five) minutes as needed for chest pain. Freada Bergeron, MD  Expired 08/13/20 2359 Self           Med Note Lia Hopping, MINDY L   Thu Jul 19, 2020 10:25 AM)    OVER THE COUNTER MEDICATION [id number]  Take 9 tablets by mouth daily. Vernell Leep for joint health [provider]  Active Self  oxybutynin (DITROPAN-XL) 10 MG 24 hr tablet [id number]  Take 10 mg by mouth at bedtime. [provider]  Active Self  rivaroxaban (XARELTO) 20 MG TABS tablet [id number]  Take 1 tablet (20 mg total) by mouth daily with supper. Fenton, Clint R, PA  Expired 10/02/20 2359   rosuvastatin (CRESTOR) 20 MG tablet [id number]  Take 1 tablet (20 mg total) by mouth daily. Freada Bergeron, MD  Active   traZODone (DESYREL) 50 MG tablet [id number]  TAKE 1 TABLET (50 MG TOTAL) BY MOUTH AT BEDTIME AS NEEDED. Nafziger, Tommi Rumps, NP  Active Self  trolamine salicylate (ASPERCREME) 10 % cream [id number]  Apply 1 application topically as needed for muscle pain. [provider]  Active Self  Turmeric 1053 MG TABS [id number]  Take 3 tablets by mouth daily. [provider]  Active Self  Patient Active Problem List   Diagnosis Date Noted   Melanoma of skin ([place]) 12/11/2020   Family history of bladder cancer 12/11/2020   Snoring 08/21/2020   Chest pain, precordial 05/18/2020   Persistent atrial fibrillation ([place])  04/06/2020   Secondary hypercoagulable state ([place]) 04/06/2020   Hyperlipidemia 01/02/2017   Diabetes mellitus ([place]) 01/03/2016   HTN (hypertension) 10/18/2014   Malignant neoplasm of prostate ([place]) 10/18/2014    Immunization History  Administered Date(s) Administered   Influenza, High Dose Seasonal PF 10/18/2014, 10/24/2015, 12/05/2017, 12/07/2019, 12/17/2020   Moderna Covid-19 Vaccine Bivalent Booster 37yr & up 12/17/2020   Moderna SARS-COV2 Booster Vaccination 12/13/2019   Moderna Sars-Covid-2 Vaccination 04/02/2019, 04/30/2019   Pneumococcal Conjugate-13 10/18/2014   Pneumococcal Polysaccharide-23 10/24/2015   Patient has not cut back on alcohol intake and does not plan to at this point.  Conditions to be addressed/monitored:  Hypertension, Hyperlipidemia, Diabetes, Atrial Fibrillation and insomnia and history of prostate cancer  Conditions addressed this visit: Atrial fibrillation, hypertension  Care Plan : CCM Pharmacy Care Plan  Updates made by PViona Gilmore RAmblersince 12/18/2020 12:00 AM     Problem: Problem: Hypertension, Hyperlipidemia, Diabetes, Atrial Fibrillation and insomnia and history of prostate cancer      Long-Range Goal: Patient-Specific Goal   Start Date: 04/02/2020  Expected End Date: 04/02/2021  Recent Progress: On track  Priority: High  Note:   Current Barriers:  Unable to independently monitor therapeutic efficacy Unable to achieve control of cholesterol   Pharmacist Clinical Goal(s):  Patient will verbalize ability to afford treatment regimen achieve control of cholesterol as evidenced by lipid panel  through collaboration with PharmD and provider.   Interventions: 1:1 collaboration with NDorothyann Peng NP regarding development and update of comprehensive plan of care as evidenced by provider attestation and co-signature Inter-disciplinary care team collaboration (see longitudinal plan of care) Comprehensive medication review performed;  medication list updated in electronic medical record  Hypertension (BP goal <140/90) -Controlled -Current treatment: Metoprolol tartrate 25 mg 1 tablet twice daily -Medications previously tried: quinapril (switched for Afib) -Current home readings: 137/78 HR 87, 146/90, 134/90 HR 101, 141/85, 128/75 -Current dietary habits: Patient uses some salt when cooking but is conscious of salt content and uses sea salt -Current exercise habits: 2 mile walk each morning -Denies hypotensive/hypertensive symptoms -Educated on Exercise goal of 150 minutes per week; Importance of home blood pressure monitoring; -Counseled to monitor BP at home a couple times a week, document, and provide log at future appointments -Recommended to continue current medication  Hyperlipidemia: (LDL goal < 100) -Uncontrolled -Current treatment: Rosuvastatin 20 mg 1 tablet daily  -Medications previously tried: atorvastatin (fatigue/foggy), pravastatin -Current dietary patterns: drinks about 7-10 beers every day and has switched from IPA to light beer -Current exercise habits: walking 2 miles a day -Educated on Cholesterol goals;  Benefits of statin for ASCVD risk reduction; Importance of limiting foods high in cholesterol; Exercise goal of 150 minutes per week; -Recommended to continue current medication  Atrial Fibrillation (Goal: prevent stroke and major bleeding) -Controlled -CHADSVASC: 3 -Current treatment: Rate control: metoprolol tartrate 25 mg 1 tablet three times daily as needed; metoprolol succinate 25 mg 1 tablet every evening Anticoagulation: Xarelto 20 mg 1 tablet with dinner -Medications previously tried: none -Home BP and HR readings: unable to provide  -Counseled on bleeding risk associated with Xarelto and importance of self-monitoring for signs/symptoms of bleeding; avoidance of NSAIDs due to increased bleeding risk with anticoagulants; -Recommended to continue current medication Assessed  patient  finances. Qualifies for [employer].  Insomnia (Goal: improve quality and quantity of sleep) -Controlled -Current treatment  Trazodone 50 mg 1 tablet at bedtime as needed -Medications previously tried: none  -Recommended to continue current medication Counseled on practicing good sleep hygiene by setting a sleep schedule and maintaining it, avoid excessive napping, following a nightly routine, avoiding screen time for 30-60 minutes before going to bed, and making the bedroom a cool, quiet and dark space   History of prostate cancer (Goal: minimize symptoms of urinary urgency/frequency) -Uncontrolled -Current treatment  Oxybutynin ER 10 mg 1 tablet every evening -Medications previously tried: none  -Recommended to discuss with urologist about switching Oxybutynin to more effective alternative  Health Maintenance -Vaccine gaps: shingles, tetanus -Current therapy:  Multivitamin 1 tablet daily Gin raisin 8-9 raisins (anti-inflammatory for thumbs, knees) Turmeric 400 mg 1 tablet daily (feet pain) -Educated on Cost vs benefit of each product must be carefully weighed by individual consumer -Patient is satisfied with current therapy and denies issues -Recommended to continue current medication  Patient Goals/Self-Care Activities Patient will:  - take medications as prescribed check blood pressure a few times a week, document, and provide at future appointments  Follow Up Plan: Telephone follow up appointment with care management team member scheduled for: 6 months     Medication Assistance:  Getting Xarelto through [place] [facility].  Compliance/Adherence/Medication fill history: Care Gaps: Urine Micro - Overdue TDAP- Overdue Zoster Vaccine - Overdue Eye Exam - Overdue COVID Booster #4 Levan Hurst) - Overdue Foot Exam - Overdue  Star-Rating Drugs: Rosuvastatin (Crestor) 20 mg - Last filled 10/10/2020 90 DS at [facility] Losartan (Cozaar) 50 mg - Last filled 09/27/2020 90 DS at  [facility]  Patient's preferred pharmacy is:  [facility] # [place], [place] [address] [place] [address] Phone: [phone] Fax: [phone]  Uses pill box? No - takes most in the morning and only 2 at night Pt endorses 95% compliance - missed one in last month  We discussed: Current pharmacy is preferred with insurance plan and patient is satisfied with pharmacy services Patient decided to: Continue current medication management strategy  Care Plan and Follow Up Patient Decision:  Patient agrees to Care Plan and Follow-up.  Plan: Telephone follow up appointment with care management team member scheduled for:  6 months  [name], PharmD [place] Pharmacist [place] [facility] [place] [phone]

## 2020-12-21 NOTE — Progress Notes (Signed)
HPI M former smoker (30 pkyrs) followed for mild OSA, complicated by AFib/ Xarelto, HTN, GERD, DM2, Prostate Cancer, Hyperlipidemia, Covid infection 2019,  HST 09/25/20- AHI 8.8/ hr, desaturation to 86%, body weight 193 lbs ================================================================  HPI7/5/22- 80 yoM former smoker (70 pkyrs) for sleep evaluation courtesy of Dorothyann Peng, NP with concern of OSA Medical problem list includes AFib, HTN, GERD, DM2, Prostate Cancer, Hyperlipidemia, Covid infection 2019,  Meds include- trazodone 50,  Epworth score-4 Body weight today-193 lbs Covid vax- 3 Moderna He was advised by his Cardiologist to seek evaluation for possible OSA due to his AFib. He is aware he snores, "worse when I smoked". Otherwise he feels he sleeps well, minimizing daytime sleepiness and denying complex parasomnias.  Trazodone for sleep, 18 oz coffee/ day. Denies ENT surgery and lung disease. Quit smoking 15 yrs ago. Prostate cancer, now moving beyond observation status.  Married, retired Production assistant, radio.  12/24/20- 63 yoM former smoker (70 pkyrs) for sleep evaluation courtesy of Dorothyann Peng, NP with concern of OSA Medical problem list includes AFib/ Xarelto, HTN, GERD, DM2, Prostate Cancer, Hyperlipidemia, Covid infection 2019,  Meds include- trazodone 50,  Body weight today-190 lbs Covid vax- 4 Moderna Flu vax-had HST 09/25/20- AHI 8.8/ hr, desaturation to 86%, body weight 193 lbs  We discussed management options including observation, for very mild OSA. He is going to [employer] and will discuss oral appliance with them. AFib and HTN are co-morbidities of concern with OSA. Cardiology follows for AFib and oncology for Prostate Cancer.  ROS-see HPI   + = positive Constitutional:    weight loss, night sweats, fevers, chills, fatigue, lassitude. HEENT:    +headaches, difficulty swallowing, tooth/dental problems, sore throat,       sneezing, itching, ear ache, nasal  congestion, post nasal drip, snoring CV:    chest pain, orthopnea, PND, swelling in lower extremities, anasarca,               dizziness, +palpitations Resp:   shortness of breath with exertion or at rest.                productive cough,   non-productive cough, coughing up of blood.              change in color of mucus.  wheezing.   Skin:    rash or lesions. GI:  No-   heartburn, indigestion, abdominal pain, nausea, vomiting, diarrhea,                 change in bowel habits, loss of appetite GU: dysuria, change in color of urine, no urgency or frequency.   flank pain. MS:   joint pain, +stiffness, decreased range of motion, back pain. Neuro-     nothing unusual Psych:  change in mood or affect.  depression or +anxiety.   memory loss.  OBJ- Physical Exam General- Alert, Oriented, Affect-appropriate, Distress- none acute, not obese Skin- rash-none, lesions- none, excoriation- none Lymphadenopathy- none Head- atraumatic            Eyes- Gross vision intact, PERRLA, conjunctivae and secretions clear            Ears- Hearing, canals-normal            Nose- Clear, no-Septal dev, mucus, polyps, erosion, perforation             Throat- Mallampati III-IV,  mucosa clear , drainage- none, tonsils- atrophic,  Teeth+ repair Neck- flexible , trachea midline, no stridor , thyroid nl,  carotid no bruit Chest - symmetrical excursion , unlabored           Heart/CV- +IRR/ AFib , no murmur , no gallop  , no rub, nl s1 s2                           - JVD- none , edema- none, stasis changes- none, varices- none           Lung- clear to P&A, wheeze- none, cough- none , dullness-none, rub- none           Chest wall-  Abd-  Br/ Gen/ Rectal- Not done, not indicated Extrem- cyanosis- none, clubbing, none, atrophy- none, strength- nl Neuro- grossly intact to observation

## 2020-12-22 DIAGNOSIS — Z20822 Contact with and (suspected) exposure to covid-19: Secondary | ICD-10-CM | POA: Diagnosis not present

## 2020-12-24 ENCOUNTER — Encounter: Payer: Self-pay | Admitting: Internal Medicine

## 2020-12-24 ENCOUNTER — Ambulatory Visit (INDEPENDENT_AMBULATORY_CARE_PROVIDER_SITE_OTHER): Payer: Medicare Other | Admitting: Internal Medicine

## 2020-12-24 ENCOUNTER — Other Ambulatory Visit: Payer: Self-pay

## 2020-12-24 DIAGNOSIS — I2510 Atherosclerotic heart disease of native coronary artery without angina pectoris: Secondary | ICD-10-CM

## 2020-12-24 DIAGNOSIS — I4819 Other persistent atrial fibrillation: Secondary | ICD-10-CM | POA: Diagnosis not present

## 2020-12-24 DIAGNOSIS — G4733 Obstructive sleep apnea (adult) (pediatric): Secondary | ICD-10-CM

## 2020-12-24 NOTE — Patient Instructions (Signed)
You have mild obstructive sleep apnea. We discussed options. For now, we will give you a copy of your sleep study that you can show to your dentist. As discussed, sleeping off your back, keeping your weight down, a fitted oral appliance and CPAP are possible choices. You can also ask your cardiologist how important he feels it would be to go ahead with treatment for sleep apnea, vs observation for now.

## 2020-12-25 ENCOUNTER — Encounter: Payer: Self-pay | Admitting: Genetic Counselor

## 2020-12-25 ENCOUNTER — Telehealth: Payer: Self-pay | Admitting: Genetic Counselor

## 2020-12-25 DIAGNOSIS — Z1379 Encounter for other screening for genetic and chromosomal anomalies: Secondary | ICD-10-CM | POA: Insufficient documentation

## 2020-12-25 NOTE — Telephone Encounter (Signed)
LM on VM that results are back and to please call. 

## 2020-12-27 NOTE — Telephone Encounter (Signed)
Left 2nd message on VM that results are back and to please call.

## 2020-12-28 ENCOUNTER — Ambulatory Visit: Payer: Self-pay | Admitting: Genetic Counselor

## 2020-12-28 ENCOUNTER — Other Ambulatory Visit: Payer: Self-pay | Admitting: Adult Health

## 2020-12-28 DIAGNOSIS — G4700 Insomnia, unspecified: Secondary | ICD-10-CM

## 2020-12-28 DIAGNOSIS — Z1379 Encounter for other screening for genetic and chromosomal anomalies: Secondary | ICD-10-CM

## 2020-12-28 NOTE — Telephone Encounter (Signed)
Revealed negative genetic testing.  Discussed that we do not know why he has prostate cancer or why there is cancer in the family. It could be due to a different gene that we are not testing, or maybe our current technology may not be able to pick something up.  It will be important for him to keep in contact with genetics to keep up with whether additional testing may be needed.   

## 2020-12-28 NOTE — Progress Notes (Signed)
HPI:  [name] was previously seen in the [place] clinic due to a personal and family history of cancer and concerns regarding a hereditary predisposition to cancer. Please refer to our prior cancer genetics clinic note for more information regarding our discussion, assessment and recommendations, at the time. [name]'s recent genetic test results were disclosed to him, as were recommendations warranted by these results. These results and recommendations are discussed in more detail below.  CANCER HISTORY:  Oncology History  Malignant neoplasm of prostate ([place])  10/18/2014 Initial Diagnosis   Malignant neoplasm of prostate ([place])   08/13/2020 Cancer Staging   Staging form: Prostate, AJCC 8th Edition - Clinical stage from 08/13/2020: Stage IIB (cT1c, cN0, cM0, PSA: 4.5, Grade Group: 2) - Signed by Freeman Caldron, PA-C on 12/11/2020 Histopathologic type: Adenocarcinoma, NOS Stage prefix: Initial diagnosis Prostate specific antigen (PSA) range: Less than 10 Gleason primary pattern: 3 Gleason secondary pattern: 4 Gleason score: 7 Histologic grading system: 5 grade system Number of biopsy cores examined: 12 Number of biopsy cores positive: 3 Location of positive needle core biopsies: Both sides    12/21/2020 Genetic Testing   Negative genetic testing on the CancerNext-Expanded+RNAinsight panel.  The report date is 12/21/2020.  The CancerNext-Expanded gene panel offered by [facility] and includes sequencing and rearrangement analysis for the following 77 genes: AIP, ALK, APC*, ATM*, AXIN2, BAP1, BARD1, BLM, BMPR1A, BRCA1*, BRCA2*, BRIP1*, CDC73, CDH1*, CDK4, CDKN1B, CDKN2A, CHEK2*, CTNNA1, DICER1, FANCC, FH, FLCN, GALNT12, KIF1B, LZTR1, MAX, MEN1, MET, MLH1*, MSH2*, MSH3, MSH6*, MUTYH*, NBN, NF1*, NF2, NTHL1, PALB2*, PHOX2B, PMS2*, POT1, PRKAR1A, PTCH1, PTEN*, RAD51C*, RAD51D*, RB1, RECQL, RET, SDHA, SDHAF2, SDHB, SDHC, SDHD, SMAD4, SMARCA4, SMARCB1, SMARCE1,  STK11, SUFU, TMEM127, TP53*, TSC1, TSC2, VHL and XRCC2 (sequencing and deletion/duplication); EGFR, EGLN1, HOXB13, KIT, MITF, PDGFRA, POLD1, and POLE (sequencing only); EPCAM and GREM1 (deletion/duplication only). DNA and RNA analyses performed for * genes.      FAMILY HISTORY:  We obtained a detailed, 4-generation family history.  Significant diagnoses are listed below: Family History  Problem Relation Age of Onset   Heart disease Mother    Hypertension Mother    Glaucoma Mother    Arthritis Father    Heart disease Father    Hypertension Father    Bladder Cancer Father 61   Colon polyps Father    Dementia Brother    Lymphoma Cousin    Prostate cancer Cousin        mother's first cousin   Colon cancer Neg Hx    Esophageal cancer Neg Hx    Rectal cancer Neg Hx    Stomach cancer Neg Hx    Breast cancer Neg Hx       The patient has a son and daughter, both cancer free.  His daughter has passed away at age 4.  He has one brother who is 93 and affected with Dementia.  Both parents are deceased.   The patient's mother died at 8.  She has a sister who is deceased and had a son with lymphoma.  The maternal grandparents are deceased from non-cancer related issues.   The patient's father had bladder cancer at 39.  He had a brother and sister who did not have cancer.  His parents died of non cancer related issues.   [name] is unaware of previous family history of genetic testing for hereditary cancer risks. Patient's maternal ancestors are of [place] descent, and paternal ancestors are of [place] descent. There  is no reported Ashkenazi Jewish ancestry. There is no known consanguinity  GENETIC TEST RESULTS: Genetic testing reported out on December 21, 2020 through the CancerNext-Expanded+RNAinsight cancer panel found no pathogenic mutations. The CancerNext-Expanded gene panel offered by [facility] and includes sequencing and rearrangement analysis for the following 77 genes:  AIP, ALK, APC*, ATM*, AXIN2, BAP1, BARD1, BLM, BMPR1A, BRCA1*, BRCA2*, BRIP1*, CDC73, CDH1*, CDK4, CDKN1B, CDKN2A, CHEK2*, CTNNA1, DICER1, FANCC, FH, FLCN, GALNT12, KIF1B, LZTR1, MAX, MEN1, MET, MLH1*, MSH2*, MSH3, MSH6*, MUTYH*, NBN, NF1*, NF2, NTHL1, PALB2*, PHOX2B, PMS2*, POT1, PRKAR1A, PTCH1, PTEN*, RAD51C*, RAD51D*, RB1, RECQL, RET, SDHA, SDHAF2, SDHB, SDHC, SDHD, SMAD4, SMARCA4, SMARCB1, SMARCE1, STK11, SUFU, TMEM127, TP53*, TSC1, TSC2, VHL and XRCC2 (sequencing and deletion/duplication); EGFR, EGLN1, HOXB13, KIT, MITF, PDGFRA, POLD1, and POLE (sequencing only); EPCAM and GREM1 (deletion/duplication only). DNA and RNA analyses performed for * genes. The test report has been scanned into EPIC and is located under the Molecular Pathology section of the Results Review tab.  A portion of the result report is included below for reference.     We discussed with [name] that because current genetic testing is not perfect, it is possible there may be a gene mutation in one of these genes that current testing cannot detect, but that chance is small.  We also discussed, that there could be another gene that has not yet been discovered, or that we have not yet tested, that is responsible for the cancer diagnoses in the family. It is also possible there is a hereditary cause for the cancer in the family that [name] did not inherit and therefore was not identified in his testing.  Therefore, it is important to remain in touch with cancer genetics in the future so that we can continue to offer [name] the most up to date genetic testing.   ADDITIONAL GENETIC TESTING: We discussed with [name] that his genetic testing was fairly extensive.  If there are genes identified to increase cancer risk that can be analyzed in the future, we would be happy to discuss and coordinate this testing at that time.    CANCER SCREENING RECOMMENDATIONS: [name]'s test result is considered negative  (normal).  This means that we have not identified a hereditary cause for his personal and family history of cancer at this time. Most cancers happen by chance and this negative test suggests that his cancer may fall into this category.    While reassuring, this does not definitively rule out a hereditary predisposition to cancer. It is still possible that there could be genetic mutations that are undetectable by current technology. There could be genetic mutations in genes that have not been tested or identified to increase cancer risk.  Therefore, it is recommended he continue to follow the cancer management and screening guidelines provided by his oncology and primary healthcare provider.   An individual's cancer risk and medical management are not determined by genetic test results alone. Overall cancer risk assessment incorporates additional factors, including personal medical history, family history, and any available genetic information that may result in a personalized plan for cancer prevention and surveillance  RECOMMENDATIONS FOR FAMILY MEMBERS:  Individuals in this family might be at some increased risk of developing cancer, over the general population risk, simply due to the family history of cancer.  We recommended women in this family have a yearly mammogram beginning at age 33, or 23 years younger than the earliest onset of cancer, an annual  clinical breast exam, and perform monthly breast self-exams. Women in this family should also have a gynecological exam as recommended by their primary provider. All family members should be referred for colonoscopy starting at age 34.  FOLLOW-UP: Lastly, we discussed with [name] that cancer genetics is a rapidly advancing field and it is possible that new genetic tests will be appropriate for him and/or his family members in the future. We encouraged him to remain in contact with cancer genetics on an annual basis so we can update his personal and  family histories and let him know of advances in cancer genetics that may benefit this family.   Our contact number was provided. [name]'s questions were answered to his satisfaction, and he knows he is welcome to call us at anytime with additional questions or concerns.   Roma Kayser, Accident, [facility] Licensed, Certified Genetic Counselor [email or web address]

## 2020-12-31 NOTE — Telephone Encounter (Signed)
Pt has been scheduled for CPE

## 2021-01-08 NOTE — Progress Notes (Addendum)
Cardiology Office Note:    Date:  01/18/2021   ID:  James Edwards, DOB 1950-03-21, MRN [id number]  PCP:  James Peng, NP   [facility]  Cardiologist:  Freada Bergeron, MD  Advanced Practice Provider:  No care team member to display Electrophysiologist:  Thompson Grayer, MD   Referring MD: James Peng, NP    History of Present Illness:    James Edwards is a 70 y.o. male with a hx of  DMII, HTN, persistent Afib, prostate cancer and GERD who presents to clinic for CV follow-up.  The patient was initially diagnosed with atrial fibrillation 03/27/20 after presenting to his PCP office. Patient reports his Apple Watch alerted him that he was in afib on 01/30/20 but did not have any other alerts until 03/2020. ECG at his PCP office confirmed afib. He was started on metoprolol for rate control and Xarelto for a CHADS2VASC score of 2. TTE was obtained which showed LVEF 55% with inferior basal hypokinesis, moderate LAE, mild/mod aortic sclerosis without stenosis.  Was seen in clinic on 04/16/20 where he was doing well. We obtained CTA on 04/23/20 for inferobasal hypokinesis noted on TTE, which showed mild LAD disease with Ca score 225 (58% for age-sex matched control).  FFR with normal prox and mid. Distal 0.78 but was smaller caliber. Was recommended for medical management.  Saw Dr. Rayann Heman in clinic on 05/02/20 where he was recommended for rate control of his Afib due to severe LA enlargement. He then presented to the ER in 05/2020 with chest pain. He underwent LHC which revealed mild, nonobstructive disease.  During last clinic visit on 07/19/20, he was having intermittent fatigue with his Afib. No significant chest pain or HF symptoms. We started metop XL with prn tartrate for breakthrough symptoms.  Today, the patient continues to have intermittent fatigue and chest pain. States he is always in atrial fibrillation according to his watch. Overall rates are  controlled on metoprolol. No SOB. Has taken nitro a couple of times with relief and his chest discomfort. No significant palpitations, lightheadedness or syncope. Only intermittently takes breakthrough metoprolol for rapid heart rates.    Past Medical History:  Diagnosis Date   BPH without obstruction/lower urinary tract symptoms    Diet-controlled type 2 diabetes mellitus ([place])    followed by pcp   ED (erectile dysfunction)    GERD (gastroesophageal reflux disease)    no meds   History of 2019 novel coronavirus disease (COVID-19) 2020   per pt asymptomatic   History of melanoma excision 06/2020   per pt localized area mid back   Hypertension    followed by pcp   OA (osteoarthritis)    Persistent atrial fibrillation ([place]) 03/2020   cardiologist-- dr h. Johney Frame---  on xarelto and bb;  work-up includes TTE, CTA, event monitor, and cardiac cath (results in epic),  pt ef 55-656 per cath and echo 55%   Prostate cancer ([place]) 08/2009   urologist--- dr Alinda Money--- first dx 07/ 2011,  Gleason 3+3,  active surveillance since   Wears glasses     Past Surgical History:  Procedure Laterality Date   COLONOSCOPY     last one 2019 approx   ELBOW SURGERY Right 2007   LEFT HEART CATH AND CORONARY ANGIOGRAPHY N/A 05/18/2020   Procedure: LEFT HEART CATH AND CORONARY ANGIOGRAPHY;  Surgeon: Martinique, Peter M, MD;  Location: [facility];  Service: Cardiovascular;  Laterality: N/A;   PROSTATE BIOPSY  N/A 01/21/2016   Procedure: BIOPSY TRANSRECTAL ULTRASONIC PROSTATE (TUBP);  Surgeon: Raynelle Bring, MD;  Location: [facility];  Service: Urology;  Laterality: N/A;   PROSTATE BIOPSY N/A 07/15/2018   Procedure: NEEDLE BIOPSY TRANSRECTAL ULTRASONIC PROSTATE (TUBP);  Surgeon: Raynelle Bring, MD;  Location: [facility];  Service: Urology;  Laterality: N/A;   PROSTATE BIOPSY N/A 08/13/2020   Procedure: BIOPSY TRANSRECTAL ULTRASONIC PROSTATE (TUBP);  Surgeon: Raynelle Bring, MD;  Location: [facility];   Service: Urology;  Laterality: N/A;    Current Medications: Current Meds  Medication Sig   acetaminophen (TYLENOL) 500 MG tablet Take 1,000 mg by mouth every 6 (six) hours as needed for moderate pain.   Cyanocobalamin (B-12) 1000 MCG CAPS Take 1 tablet by mouth daily.   isosorbide mononitrate (IMDUR) 30 MG 24 hr tablet Take 0.5 tablets (15 mg total) by mouth daily.   losartan (COZAAR) 50 MG tablet Take 1 tablet (50 mg total) by mouth daily.   metoprolol succinate (TOPROL XL) 25 MG 24 hr tablet Take 1 tablet (25 mg total) by mouth daily.   metoprolol tartrate (LOPRESSOR) 25 MG tablet Take 1 tablet (25 mg total) by mouth 3 (three) times daily as needed (for break through heart rate greater than 100.).   Multiple Vitamin (MULTIVITAMIN) tablet Take 1 tablet by mouth daily.   nitroGLYCERIN (NITROSTAT) 0.4 MG SL tablet Place 1 tablet (0.4 mg total) under the tongue every 5 (five) minutes as needed for chest pain.   OVER THE COUNTER MEDICATION Take 9 tablets by mouth daily. Gin Raisin for joint health   oxybutynin (DITROPAN-XL) 10 MG 24 hr tablet Take 10 mg by mouth at bedtime.   rivaroxaban (XARELTO) 20 MG TABS tablet Take 1 tablet (20 mg total) by mouth daily with supper.   rosuvastatin (CRESTOR) 20 MG tablet Take 1 tablet (20 mg total) by mouth daily.   traZODone (DESYREL) 50 MG tablet TAKE 1 TABLET (50 MG TOTAL) BY MOUTH AT BEDTIME AS NEEDED.   trolamine salicylate (ASPERCREME) 10 % cream Apply 1 application topically as needed for muscle pain.   Turmeric 1053 MG TABS Take 3 tablets by mouth daily.     Allergies:   Codeine   Social History   Socioeconomic History   Marital status: Married    Spouse name: Not on file   Number of children: 1   Years of education: Not on file   Highest education level: Not on file  Occupational History   Not on file  Tobacco Use   Smoking status: Former    Packs/day: 2.00    Years: 35.00    Pack years: 70.00    Types: Cigarettes    Quit date: 2009     Years since quitting: 13.9   Smokeless tobacco: Never  Vaping Use   Vaping Use: Never used  Substance and Sexual Activity   Alcohol use: Yes    Alcohol/week: 42.0 standard drinks    Types: 42 Cans of beer per week    Comment: 6pk beer per day  (12 oz each)   Drug use: Never   Sexual activity: Not on file  Other Topics Concern   Not on file  Social History Narrative   Lives in [place] with wife.   Retired.  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    Two Children who both live locally.       He likes to sleep.  Social Determinants of Health   Financial Resource Strain: Low Risk    Difficulty of Paying Living Expenses: Not hard at all  Food Insecurity: No Food Insecurity   Worried About Charity fundraiser in the Last Year: Never true   [place] in the Last Year: Never true  Transportation Needs: No Transportation Needs   Lack of Transportation (Medical): No   Lack of Transportation (Non-Medical): No  Physical Activity: Sufficiently Active   Days of Exercise per Week: 7 days   Minutes of Exercise per Session: 50 min  Stress: No Stress Concern Present   Feeling of Stress : Not at all  Social Connections: Moderately Isolated   Frequency of Communication with Friends and Family: Three times a week   Frequency of Social Gatherings with Friends and Family: Three times a week   Attends Religious Services: Never   Active Member of Clubs or Organizations: No   Attends Archivist Meetings: Never   Marital Status: Married     Family History: The patient's family history includes Arthritis in his father; Bladder Cancer (age of onset: 25) in his father; Colon polyps in his father; Dementia in his brother; Glaucoma in his mother; Heart disease in his father and mother; Hypertension in his father and mother; Lymphoma in his cousin; Prostate cancer in his cousin. There is no history of Colon cancer, Esophageal cancer,  Rectal cancer, Stomach cancer, or Breast cancer.  ROS:   Please see the history of present illness.    Review of Systems  Constitutional:  Positive for malaise/fatigue. Negative for chills and fever.  HENT:  Negative for hearing loss.   Eyes:  Negative for blurred vision.  Respiratory:  Negative for shortness of breath.   Cardiovascular:  Positive for chest pain. Negative for palpitations, orthopnea, claudication, leg swelling and PND.  Gastrointestinal:  Negative for blood in stool, melena and nausea.  Genitourinary:  Negative for hematuria.  Musculoskeletal:  Positive for joint pain.  Neurological:  Negative for dizziness and loss of consciousness.  Endo/Heme/Allergies:  Negative for polydipsia.  Psychiatric/Behavioral:  Negative for substance abuse.    EKGs/Labs/Other Studies Reviewed:    The following studies were reviewed today: LHC May 22, 2020:  Prox LAD lesion is 20% stenosed. Dist Cx lesion is 30% stenosed. The left ventricular systolic function is normal. LV end diastolic pressure is normal. The left ventricular ejection fraction is 55-65% by visual estimate.   1. Minor nonobstructive CAD 2. Normal LV function 3. Normal LVEDP   Plan: risk factor modification.    Coronary CTA 04/23/20: FINDINGS: A 140 kV prospective scan was triggered in the descending thoracic aorta at 111 HU's. Axial non-contrast 3 mm slices were carried out through the heart. The data set was analyzed on a dedicated work station and scored using the [place]. Gantry rotation speed was 250 msecs and collimation was .6 mm. Beta blockade and 0.8 mg of sl NTG was given. The 3D data set was reconstructed in 5% intervals of the 67-82 % of the R-R cycle. Diastolic phases were analyzed on a dedicated work station using MPR, MIP and VRT modes. The patient received 80 cc of contrast.   Aorta:  Normal size.  Aortic atherosclerosis.  No dissection.   Aortic Valve:  Trileaflet.  No calcifications.    Coronary Arteries:  Normal coronary origin.  Right dominance.   RCA is a large dominant artery that gives rise to PDA and PLA. There is no plaque.   Left  main is a large artery that gives rise to LAD and LCX arteries.   LAD is a large vessel that has proximal and mid plaque (at diagonal bifurcation), 25-49% stenosis. Will check FFR for validation.   LCX is a non-dominant artery that gives rise to one large OM1 branch. There is mild focal calcified plaque, 0-24% stenosis in the mid AV groove segment.   Other findings:   Normal pulmonary vein drainage into the left atrium.   Normal left atrial appendage without a thrombus.   Normal size of the pulmonary artery.   Please see radiology report for non cardiac findings.   IMPRESSION: 1. Coronary calcium score of 224. This was 61 percentile for age and sex matched control.   2. Normal coronary origin with right dominance.   3. Proximal LAD and mid LAD calcified plaque of 25-49%. Sending for FFR for validation. Small focal circumflex calcified plaque 0-24%.  FINDINGS: FFRct analysis was performed on the original cardiac CT angiogram dataset. Diagrammatic representation of the FFRct analysis is provided in a separate PDF document in PACS. This dictation was created using the PDF document and an interactive 3D model of the results. 3D model is not available in the EMR/PACS. Normal FFR range is >0.80.   1. Left Main: Normal   2. LAD: Normal prox and mid. Distal 0.78 however this is smaller caliber vessel.   3. LCX: Normal   4. Ramus: N/A   5. RCA: Normal  Cardiac Monitor 05/08/20: Patch wear time was 6 days and 10 hours. There was 100% burden of atrial fibrillation with HR ranging from 56-172 bpm (average of 95bpm) There was 1 run of VT lasting 6 beats at HR 207bpm Rare PVCs     Patch Wear Time:  6 days and 10 hours (2022-03-08T19:46:41-0500 to 2022-03-15T07:40:13-0400)   1 run of Ventricular Tachycardia occurred  lasting 6 beats with a max rate of 207 bpm (avg 192 bpm). Atrial Fibrillation occurred continuously (100% burden), ranging from 56-172 bpm (avg of 95 bpm). Isolated VEs were rare (<1.0%, 3688), VE Couplets were  rare (<1.0%, 215), and VE Triplets were rare (<1.0%, 4). Ventricular Trigeminy was present.    TTE 04/15/20: IMPRESSIONS   1. Inferior basal hypokinesis . Left ventricular ejection fraction, by  estimation, is 55%. The left ventricle has normal function. The left  ventricle has no regional wall motion abnormalities. Left ventricular  diastolic parameters are indeterminate.   2. Right ventricular systolic function is normal. The right ventricular  size is normal. There is normal pulmonary artery systolic pressure.   3. Left atrial size was moderately dilated.   4. The mitral valve is abnormal. Trivial mitral valve regurgitation. No  evidence of mitral stenosis.   5. The aortic valve is tricuspid. There is moderate calcification of the  aortic valve. Aortic valve regurgitation is not visualized. Mild to  moderate aortic valve sclerosis/calcification is present, without any  evidence of aortic stenosis.   6. The inferior vena cava is normal in size with greater than 50%  respiratory variability, suggesting right atrial pressure of 3 mmHg.   Abdominal aortogram 03/2018:     Abdominal Aorta Findings:  +-----------+-------+----------+----------+--------+--------+--------+  Location   AP (cm)Trans (cm)PSV (cm/s)WaveformThrombusComments  +-----------+-------+----------+----------+--------+--------+--------+  Proximal   1.75   1.72      102                                 +-----------+-------+----------+----------+--------+--------+--------+  Mid        2.22   2.00      113                                 +-----------+-------+----------+----------+--------+--------+--------+  Distal     1.71   1.75      122                                  +-----------+-------+----------+----------+--------+--------+--------+  RT CIA Prox0.9    0.9       407                                 +-----------+-------+----------+----------+--------+--------+--------+  LT CIA Prox0.9    1.1       451                                 +-----------+-------+----------+----------+--------+--------+--------+       Summary:  Abdominal Aorta: No evidence of an abdominal aortic aneurysm was  visualized. The largest aortic measurement is 2.2 cm.  Stenosis: +------------------+-------------+  Location          Stenosis       +------------------+-------------+  Right Common Iliac>50% stenosis  +------------------+-------------+  Left Common Iliac >50% stenosis  +------------------+-------------+    Recent Labs: 03/27/2020: TSH 1.03 05/16/2020: Hemoglobin 13.5; Platelets 220 08/13/2020: BUN 12; Creatinine, Ser 0.83; Potassium 4.2; Sodium 136  Recent Lipid Panel    Component Value Date/Time   CHOL 211 (H) 05/24/2019 0819   TRIG 97.0 05/24/2019 0819   HDL 66.30 05/24/2019 0819   CHOLHDL 3 05/24/2019 0819   VLDL 19.4 05/24/2019 0819   LDLCALC 125 (H) 05/24/2019 0819   LDLDIRECT 97.0 01/03/2016 0821   ECG: 01/18/21: Afib with HR 78  Risk Assessment/Calculations:    CHA2DS2-VASc Score = 4  This indicates a 4.8% annual risk of stroke. The patient's score is based upon: CHF History: 0 HTN History: 1 Diabetes History: 1 Stroke History: 0 Vascular Disease History: 1 (elevated calcium score 04/23/20) Age Score: 1 Gender Score: 0    Physical Exam:    VS:  BP 126/70   Pulse 78   Ht 5\' 10"  (1.778 m)   Wt 193 lb (87.5 kg)   SpO2 98%   BMI 27.69 kg/m     Wt Readings from Last 3 Encounters:  01/18/21 193 lb (87.5 kg)  12/24/20 190 lb 6.4 oz (86.4 kg)  12/11/20 192 lb 9.6 oz (87.4 kg)     GEN:  Comfortable, NAD HEENT: Normal NECK: No JVD; No carotid bruits CARDIAC: Irregularly irregular, no murmurs, rubs,  gallops RESPIRATORY:  CTAB ABDOMEN: Soft, non-tender, non-distended MUSCULOSKELETAL:  No edema; No deformity  SKIN: Warm and dry NEUROLOGIC:  Alert and oriented x 3 PSYCHIATRIC:  Normal affect   ASSESSMENT:    1. Persistent atrial fibrillation ([place])   2. Essential hypertension   3. Mild coronary artery disease   4. Mixed hyperlipidemia   5. Primary hypertension    PLAN:    In order of problems listed above:  #Mild Nonobstructive CAD: Patient with 20% prox LAD and 30% distal LCx stenosis on [facility] 05/18/20. On medical management.  -No ASA as patient on xarelto -Continue crestor 20mg  daily -Continue metop  succinate 25mg  XL -Continue losartan 50mg  daily -Will start imdur to help with intermittent chest pain   #Persistent Afib: CHADs-vasc 2. Followed by Afib clinic. Unfortunately, not a great candidate for rhythm control due to severe LAE.  -Continue xarelto 20mg  daily -Continue metop succinate 25mg  XL  -Continue metop tartrate to 25mg  TID prn for HR>100  #HTN: Well controlled and at goal <120/80s. -Metop succinate 25mg  XL daily as above -Continue losartan 50mg  daily  #HLD: -Continue Crestor 20mg  daily -Repeat cholesterol with PCP in 02/2021  #Prostate Cancer: Followed by Alinda Money. Gleason 6 adenocarcinoma.  -Plan for radiation seed implant    Medication Adjustments/Labs and Tests Ordered: Current medicines are reviewed at length with the patient today.  Concerns regarding medicines are outlined above.  Orders Placed This Encounter  Procedures   EKG 12-Lead    Meds ordered this encounter  Medications   isosorbide mononitrate (IMDUR) 30 MG 24 hr tablet    Sig: Take 0.5 tablets (15 mg total) by mouth daily.    Dispense:  45 tablet    Refill:  3     Patient Instructions  Medication Instructions:   START TAKING ISOSORBIDE MONONITRATE (IMDUR) 15 MG BY MOUTH DAILY  *If you need a refill on your cardiac medications before your next appointment, please call your  pharmacy*   Follow-Up: At [facility], you and your health needs are our priority.  As part of our continuing mission to provide you with exceptional heart care, we have created designated Provider Care Teams.  These Care Teams include your primary Cardiologist (physician) and Advanced Practice Providers (APPs -  Physician Assistants and Nurse Practitioners) who all work together to provide you with the care you need, when you need it.  We recommend signing up for the patient portal called "MyChart".  Sign up information is provided on this After Visit Summary.  MyChart is used to connect with patients for Virtual Visits (Telemedicine).  Patients are able to view lab/test results, encounter notes, upcoming appointments, etc.  Non-urgent messages can be sent to your provider as well.   To learn more about what you can do with MyChart, go to [email or web address].    Your next appointment:   6 month(s)  The format for your next appointment:   In Person  Provider:   Freada Bergeron, MD         Signed, Freada Bergeron, MD  01/18/2021 10:12 AM    [place]

## 2021-01-10 NOTE — Assessment & Plan Note (Addendum)
Very mild OSA. Because of HTN and AFib, it is worth trying to treat if he can tolerate as discussed. He would like to discuss OAP with his dentist. Otherwise, conservative measures may be sufficient.

## 2021-01-10 NOTE — Assessment & Plan Note (Signed)
His cardiologist can advise how important he feels control of very mild OSA is.

## 2021-01-16 DIAGNOSIS — I4819 Other persistent atrial fibrillation: Secondary | ICD-10-CM | POA: Diagnosis not present

## 2021-01-16 DIAGNOSIS — I10 Essential (primary) hypertension: Secondary | ICD-10-CM | POA: Diagnosis not present

## 2021-01-18 ENCOUNTER — Other Ambulatory Visit: Payer: Self-pay

## 2021-01-18 ENCOUNTER — Ambulatory Visit (INDEPENDENT_AMBULATORY_CARE_PROVIDER_SITE_OTHER): Payer: Medicare Other | Admitting: Cardiology

## 2021-01-18 ENCOUNTER — Encounter: Payer: Self-pay | Admitting: Cardiology

## 2021-01-18 VITALS — BP 126/70 | HR 78 | Ht 70.0 in | Wt 193.0 lb

## 2021-01-18 DIAGNOSIS — I4819 Other persistent atrial fibrillation: Secondary | ICD-10-CM

## 2021-01-18 DIAGNOSIS — E782 Mixed hyperlipidemia: Secondary | ICD-10-CM

## 2021-01-18 DIAGNOSIS — I2510 Atherosclerotic heart disease of native coronary artery without angina pectoris: Secondary | ICD-10-CM | POA: Diagnosis not present

## 2021-01-18 DIAGNOSIS — I10 Essential (primary) hypertension: Secondary | ICD-10-CM | POA: Diagnosis not present

## 2021-01-18 MED ORDER — ISOSORBIDE MONONITRATE ER 30 MG PO TB24: 15.0000 mg | ORAL_TABLET | Freq: Every day | ORAL | 3 refills | Status: DC

## 2021-01-18 NOTE — Patient Instructions (Signed)
Medication Instructions:   START TAKING ISOSORBIDE MONONITRATE (IMDUR) 15 MG BY MOUTH DAILY  *If you need a refill on your cardiac medications before your next appointment, please call your pharmacy*   Follow-Up: At [facility], you and your health needs are our priority.  As part of our continuing mission to provide you with exceptional heart care, we have created designated Provider Care Teams.  These Care Teams include your primary Cardiologist (physician) and Advanced Practice Providers (APPs -  Physician Assistants and Nurse Practitioners) who all work together to provide you with the care you need, when you need it.  We recommend signing up for the patient portal called "MyChart".  Sign up information is provided on this After Visit Summary.  MyChart is used to connect with patients for Virtual Visits (Telemedicine).  Patients are able to view lab/test results, encounter notes, upcoming appointments, etc.  Non-urgent messages can be sent to your provider as well.   To learn more about what you can do with MyChart, go to [email or web address].    Your next appointment:   6 month(s)  The format for your next appointment:   In Person  Provider:   Freada Bergeron, MD

## 2021-01-23 ENCOUNTER — Telehealth: Payer: Self-pay | Admitting: *Deleted

## 2021-01-23 NOTE — Progress Notes (Signed)
  Radiation Oncology         ([phone]) [phone] ________________________________  Name: James Edwards MRN: [id number]  Date: 01/24/2021  DOB: 08/16/1950  SIMULATION AND TREATMENT PLANNING NOTE PUBIC ARCH STUDY  HU:[id number], Tommi Rumps, NP  Raynelle Bring, MD  DIAGNOSIS:   70 y.o. gentleman with stage T1c adenocarcinoma of the prostate with a Gleason's score of 3+4 and a PSA of 4.48  Oncology History  Malignant neoplasm of prostate ([place])  10/18/2014 Initial Diagnosis   Malignant neoplasm of prostate ([place])   08/13/2020 Cancer Staging   Staging form: Prostate, AJCC 8th Edition - Clinical stage from 08/13/2020: Stage IIB (cT1c, cN0, cM0, PSA: 4.5, Grade Group: 2) - Signed by Freeman Caldron, PA-C on 12/11/2020 Histopathologic type: Adenocarcinoma, NOS Stage prefix: Initial diagnosis Prostate specific antigen (PSA) range: Less than 10 Gleason primary pattern: 3 Gleason secondary pattern: 4 Gleason score: 7 Histologic grading system: 5 grade system Number of biopsy cores examined: 12 Number of biopsy cores positive: 3 Location of positive needle core biopsies: Both sides    12/21/2020 Genetic Testing   Negative genetic testing on the CancerNext-Expanded+RNAinsight panel.  The report date is 12/21/2020.  The CancerNext-Expanded gene panel offered by [facility] and includes sequencing and rearrangement analysis for the following 77 genes: AIP, ALK, APC*, ATM*, AXIN2, BAP1, BARD1, BLM, BMPR1A, BRCA1*, BRCA2*, BRIP1*, CDC73, CDH1*, CDK4, CDKN1B, CDKN2A, CHEK2*, CTNNA1, DICER1, FANCC, FH, FLCN, GALNT12, KIF1B, LZTR1, MAX, MEN1, MET, MLH1*, MSH2*, MSH3, MSH6*, MUTYH*, NBN, NF1*, NF2, NTHL1, PALB2*, PHOX2B, PMS2*, POT1, PRKAR1A, PTCH1, PTEN*, RAD51C*, RAD51D*, RB1, RECQL, RET, SDHA, SDHAF2, SDHB, SDHC, SDHD, SMAD4, SMARCA4, SMARCB1, SMARCE1, STK11, SUFU, TMEM127, TP53*, TSC1, TSC2, VHL and XRCC2 (sequencing and deletion/duplication); EGFR, EGLN1, HOXB13, KIT, MITF, PDGFRA, POLD1, and POLE (sequencing  only); EPCAM and GREM1 (deletion/duplication only). DNA and RNA analyses performed for * genes.        ICD-10-CM   1. Malignant neoplasm of prostate ([place])  C61       COMPLEX SIMULATION:  The patient presented today for evaluation for possible prostate seed implant. He was brought to the radiation planning suite and placed supine on the CT couch. A 3-dimensional image study set was obtained in upload to the planning computer. There, on each axial slice, I contoured the prostate gland. Then, using three-dimensional radiation planning tools I reconstructed the prostate in view of the structures from the transperineal needle pathway to assess for possible pubic arch interference. In doing so, I did not appreciate any pubic arch interference. Also, the patient's prostate volume was estimated based on the drawn structure. The volume was 46.7 cc.  The prostate volume measured 46.1 cc on previous TRUS.  Given the pubic arch appearance and prostate volume, patient remains a good candidate to proceed with prostate seed implant. Today, he freely provided informed written consent to proceed.    PLAN: The patient will undergo prostate seed implant.   ________________________________  Sheral Apley. Tammi Klippel, M.D.

## 2021-01-23 NOTE — Telephone Encounter (Signed)
CALLED PATIENT TO REMIND OF PRE-SEED APPTS. FOR 01-24-21, LVM  FOR A RETURN CALL

## 2021-01-24 ENCOUNTER — Other Ambulatory Visit: Payer: Self-pay

## 2021-01-24 ENCOUNTER — Ambulatory Visit
Admission: RE | Admit: 2021-01-24 | Discharge: 2021-01-24 | Disposition: A | Discharge status: Home / Self Care | Payer: Medicare Other | Source: Ambulatory Visit | Attending: Urology | Admitting: Urology

## 2021-01-24 ENCOUNTER — Ambulatory Visit
Admission: RE | Admit: 2021-01-24 | Discharge: 2021-01-24 | Disposition: A | Discharge status: Home / Self Care | Payer: Medicare Other | Source: Ambulatory Visit | Attending: Radiation Oncology | Admitting: Radiation Oncology

## 2021-01-24 ENCOUNTER — Encounter: Payer: Self-pay | Admitting: Urology

## 2021-01-24 DIAGNOSIS — C61 Malignant neoplasm of prostate: Secondary | ICD-10-CM | POA: Diagnosis not present

## 2021-01-24 NOTE — Progress Notes (Signed)
Patient states doing well. No symptoms reported at this time. I-PSS score of 7 (mild). Meaningful use complete.  Oxybutynin 10mg  currently and Urology follow-up scheduled for January 2023 -per patient.  There were no vitals taken for this visit.'

## 2021-01-25 ENCOUNTER — Telehealth: Payer: Self-pay | Admitting: *Deleted

## 2021-01-25 ENCOUNTER — Other Ambulatory Visit: Payer: Self-pay | Admitting: Urology

## 2021-01-25 NOTE — Telephone Encounter (Signed)
Called patient to inform of implant date, lvm for a return call 

## 2021-01-28 ENCOUNTER — Telehealth: Payer: Self-pay | Admitting: Cardiology

## 2021-01-28 NOTE — Telephone Encounter (Signed)
Clinical pharmacist to review Xarelto 

## 2021-01-28 NOTE — Telephone Encounter (Signed)
   Pre-operative Risk Assessment    Patient Name: James Edwards  DOB: 09/25/1950 MRN: [id number]      Request for Surgical Clearance    Procedure:    Radio Active Seed Implant with Space OAR  Date of Surgery:  Clearance 04-11-2021                                 Surgeon:  Dr Raynelle Bring Surgeon's Group or Practice Name:   Phone number:  [phone] Fax number:  [phone]   Type of Clearance Requested:   Both- Xarelto held 3 days prior to surgery   Type of Anesthesia:  General   Additional requests/questions:      Lorin Glass   01/28/2021, 11:42 AM

## 2021-01-28 NOTE — Telephone Encounter (Signed)
Patient with diagnosis of persistent a fib on Xarelto for anticoagulation.    Procedure: radioactive seed implant with space OAR Date of procedure: 04/11/2021   CHA2DS2-VASc Score = 4  This indicates a 4.8% annual risk of stroke. The patient's score is based upon: CHF History: 0 HTN History: 1 Diabetes History: 1 Stroke History: 0 Vascular Disease History: 1 (elevated calcium score 04/23/20) Age Score: 1 Gender Score: 0  CrCl 96 ml/min, Scr 0.87 (05/16/2020) Platelet count 220 (05/16/2020)  Patient can hold Xarelto for 3 days as requested.    Cathrine Muster, PharmD PGY2 Cardiology Pharmacy Resident 01/28/2021  4:55 PM

## 2021-01-29 NOTE — Telephone Encounter (Signed)
Please inform the patient to hold Xarelto for 3 days prior to the upcoming procedure and restart as soon as possible afterward at the surgeon's discretion.

## 2021-01-29 NOTE — Telephone Encounter (Signed)
° ° °  Patient Name: James Edwards  DOB: November 19, 1950 MRN: [id number]  Primary Cardiologist: Freada Bergeron, MD  Chart reviewed as part of pre-operative protocol coverage. Given past medical history and time since last visit, based on ACC/AHA guidelines, James Edwards would be at acceptable risk for the planned procedure without further cardiovascular testing.   Echocardiogram obtained in February 2022 showed EF 55%, inferobasal hypokinesis, trivial MR.  Cardiac catheterization performed on 05/18/2020 showed minimal CAD.  Per recommendation by our clinical pharmacist, he will need to hold Xarelto for 3 days prior to the procedure and restart as soon as possible afterward at the surgeon's discretion.  The patient was advised that if he develops new symptoms prior to surgery to contact our office to arrange for a follow-up visit, and he verbalized understanding.  I will route this recommendation to the requesting party via Epic fax function and remove from pre-op pool.  Please call with questions.  [place], [place] 01/29/2021, 7:55 PM

## 2021-01-30 NOTE — Telephone Encounter (Signed)
Will route to the requesting surgeon's office to make them aware.  They will contact the pt with medication instructions.

## 2021-02-19 ENCOUNTER — Ambulatory Visit (INDEPENDENT_AMBULATORY_CARE_PROVIDER_SITE_OTHER): Payer: Medicare Other | Admitting: Adult Health

## 2021-02-19 ENCOUNTER — Encounter: Payer: Self-pay | Admitting: Adult Health

## 2021-02-19 VITALS — BP 140/80 | HR 86 | Temp 98.0°F | Ht 67.0 in | Wt 192.0 lb

## 2021-02-19 DIAGNOSIS — R7303 Prediabetes: Secondary | ICD-10-CM | POA: Diagnosis not present

## 2021-02-19 DIAGNOSIS — I4819 Other persistent atrial fibrillation: Secondary | ICD-10-CM | POA: Diagnosis not present

## 2021-02-19 DIAGNOSIS — I10 Essential (primary) hypertension: Secondary | ICD-10-CM | POA: Diagnosis not present

## 2021-02-19 DIAGNOSIS — E782 Mixed hyperlipidemia: Secondary | ICD-10-CM

## 2021-02-19 DIAGNOSIS — G4733 Obstructive sleep apnea (adult) (pediatric): Secondary | ICD-10-CM | POA: Diagnosis not present

## 2021-02-19 DIAGNOSIS — G4709 Other insomnia: Secondary | ICD-10-CM

## 2021-02-19 DIAGNOSIS — C61 Malignant neoplasm of prostate: Secondary | ICD-10-CM | POA: Diagnosis not present

## 2021-02-19 LAB — CBC WITH DIFFERENTIAL/PLATELET
Basophils Absolute: 0 10*3/uL (ref 0.0–0.1)
Basophils Relative: 0.4 % (ref 0.0–3.0)
Eosinophils Absolute: 0.2 10*3/uL (ref 0.0–0.7)
Eosinophils Relative: 4 % (ref 0.0–5.0)
HCT: 40.2 % (ref 39.0–52.0)
Hemoglobin: 13.4 g/dL (ref 13.0–17.0)
Lymphocytes Relative: 40.1 % (ref 12.0–46.0)
Lymphs Abs: 2 10*3/uL (ref 0.7–4.0)
MCHC: 33.3 g/dL (ref 30.0–36.0)
MCV: 98.7 fl (ref 78.0–100.0)
Monocytes Absolute: 0.6 10*3/uL (ref 0.1–1.0)
Monocytes Relative: 13 % — ABNORMAL HIGH (ref 3.0–12.0)
Neutro Abs: 2.1 10*3/uL (ref 1.4–7.7)
Neutrophils Relative %: 42.5 % — ABNORMAL LOW (ref 43.0–77.0)
Platelets: 225 10*3/uL (ref 150.0–400.0)
RBC: 4.07 Mil/uL — ABNORMAL LOW (ref 4.22–5.81)
RDW: 12.7 % (ref 11.5–15.5)
WBC: 4.9 10*3/uL (ref 4.0–10.5)

## 2021-02-19 LAB — LIPID PANEL
Cholesterol: 129 mg/dL (ref 0–200)
HDL: 58.7 mg/dL (ref >39.00)
LDL Cholesterol: 35 mg/dL (ref 0–99)
NonHDL: 70.08
Total CHOL/HDL Ratio: 2
Triglycerides: 174 mg/dL — ABNORMAL HIGH (ref 0.0–149.0)
VLDL: 34.8 mg/dL (ref 0.0–40.0)

## 2021-02-19 LAB — COMPREHENSIVE METABOLIC PANEL
ALT: 28 U/L (ref 0–53)
AST: 33 U/L (ref 0–37)
Albumin: 4.1 g/dL (ref 3.5–5.2)
Alkaline Phosphatase: 28 U/L — ABNORMAL LOW (ref 39–117)
BUN: 15 mg/dL (ref 6–23)
CO2: 26 mEq/L (ref 19–32)
Calcium: 9.9 mg/dL (ref 8.4–10.5)
Chloride: 102 mEq/L (ref 96–112)
Creatinine, Ser: 0.94 mg/dL (ref 0.40–1.50)
GFR: 81.61 mL/min (ref >60.00)
Glucose, Bld: 108 mg/dL — ABNORMAL HIGH (ref 70–99)
Potassium: 4.5 mEq/L (ref 3.5–5.1)
Sodium: 135 mEq/L (ref 135–145)
Total Bilirubin: 0.6 mg/dL (ref 0.2–1.2)
Total Protein: 6.8 g/dL (ref 6.0–8.3)

## 2021-02-19 LAB — TSH: TSH: 0.91 u[IU]/mL (ref 0.35–5.50)

## 2021-02-19 LAB — HEMOGLOBIN A1C: Hgb A1c MFr Bld: 6 % (ref 4.6–6.5)

## 2021-02-19 NOTE — Patient Instructions (Signed)
It was great seeing you today   We will follow up with you regarding your lab work   Please let me know if you need anything   I will see you back in one year or sooner if needed

## 2021-02-19 NOTE — Progress Notes (Signed)
Subjective:    Patient ID: James Edwards, male    DOB: 21-May-1950, 71 y.o.   MRN: [id number]  HPI Patient presents for yearly preventative medicine examination. He is a pleasant 71 year old male who  has a past medical history of BPH without obstruction/lower urinary tract symptoms, Diet-controlled type 2 diabetes mellitus ([place]), ED (erectile dysfunction), GERD (gastroesophageal reflux disease), History of 2019 novel coronavirus disease (COVID-19) (2020), History of melanoma excision (06/2020), Hypertension, OA (osteoarthritis), Persistent atrial fibrillation ([place]) (03/2020), Prostate cancer ([place]) (08/2009), and Wears glasses.  HTN -managed with Metoprolol 75 mg in the morning and Toprol 25 mg QHS  Monitor blood pressure at home and reports readings in the 120s to 140s over 70s to 80s consistently. Has intermittent chest pain and fatigue. He feels as though he is always in a fib.    BP Readings from Last 3 Encounters:  02/19/21 140/80  01/18/21 126/70  12/24/20 138/76   Hyperlipidemia-managed with Crestor 20 mg daily. Denies myalgia or fatigue  Lab Results  Component Value Date   CHOL 211 (H) 05/24/2019   HDL 66.30 05/24/2019   LDLCALC 125 (H) 05/24/2019   LDLDIRECT 97.0 01/03/2016   TRIG 97.0 05/24/2019   CHOLHDL 3 05/24/2019   Atrial Fibrillation -rate controlled with metoprolol 25 mg ER, the Toprol tartrate 25 mg 3 times daily as needed for heart rate greater than 100 and Xarelto 20 mg daily.  Managed by cardiology.  Insomnia-takes trazodone 50 mg nightly  History of prostate Cancer-has been under active and surveillance for multiple years.  His Gleason score has increased, does have a procedure scheduled in February 2023 for radioactive seed implantation.  OSA -very mild OSA due to hypertension and A. fib.  He is going to talk to his dentist about OAP otherwise pulmonary felt that conservative measures may be sufficient.  Glucose Intolerance - not currently on medications   Lab Results  Component Value Date   HGBA1C 5.8 05/24/2019   All immunizations and health maintenance protocols were reviewed with the patient and needed orders were placed.  Appropriate screening laboratory values were ordered for the patient including screening of hyperlipidemia, renal function and hepatic function.  Medication reconciliation,  past medical history, social history, problem list and allergies were reviewed in detail with the patient  Goals were established with regard to weight loss, exercise, and  diet in compliance with medications Wt Readings from Last 3 Encounters:  02/19/21 192 lb (87.1 kg)  01/18/21 193 lb (87.5 kg)  12/24/20 190 lb 6.4 oz (86.4 kg)    Review of Systems  Constitutional: Negative.   HENT: Negative.    Eyes: Negative.   Respiratory: Negative.    Cardiovascular: Negative.   Gastrointestinal: Negative.   Endocrine: Negative.   Genitourinary: Negative.   Musculoskeletal:  Positive for arthralgias.  Skin: Negative.   Allergic/Immunologic: Negative.   Neurological: Negative.   Hematological: Negative.   Psychiatric/Behavioral: Negative.    All other systems reviewed and are negative.  Past Medical History:  Diagnosis Date   BPH without obstruction/lower urinary tract symptoms    Diet-controlled type 2 diabetes mellitus ([place])    followed by pcp   ED (erectile dysfunction)    GERD (gastroesophageal reflux disease)    no meds   History of 2019 novel coronavirus disease (COVID-19) 2020   per pt asymptomatic   History of melanoma excision 06/2020   per pt localized area mid back   Hypertension    followed by pcp  OA (osteoarthritis)    Persistent atrial fibrillation ([place]) 03/2020   cardiologist-- dr h. Johney Frame---  on xarelto and bb;  work-up includes TTE, CTA, event monitor, and cardiac cath (results in epic),  pt ef 55-656 per cath and echo 55%   Prostate cancer [facility]) 08/2009   urologist--- dr Alinda Money--- first dx 07/ 2011,  Gleason  3+3,  active surveillance since   Wears glasses     Social History   Socioeconomic History   Marital status: Married    Spouse name: Not on file   Number of children: 1   Years of education: Not on file   Highest education level: Not on file  Occupational History   Not on file  Tobacco Use   Smoking status: Former    Packs/day: 2.00    Years: 35.00    Pack years: 70.00    Types: Cigarettes    Quit date: 2009    Years since quitting: 14.0   Smokeless tobacco: Never  Vaping Use   Vaping Use: Never used  Substance and Sexual Activity   Alcohol use: Yes    Alcohol/week: 42.0 standard drinks    Types: 42 Cans of beer per week    Comment: 6pk beer per day  (12 oz each)   Drug use: Never   Sexual activity: Not on file  Other Topics Concern   Not on file  Social History Narrative   Lives in [place] with wife.   Retired.  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    Two Children who both live locally.       He likes to sleep.    Social Determinants of Health   Financial Resource Strain: Low Risk    Difficulty of Paying Living Expenses: Not hard at all  Food Insecurity: No Food Insecurity   Worried About Charity fundraiser in the Last Year: Never true   [place] in the Last Year: Never true  Transportation Needs: No Transportation Needs   Lack of Transportation (Medical): No   Lack of Transportation (Non-Medical): No  Physical Activity: Sufficiently Active   Days of Exercise per Week: 7 days   Minutes of Exercise per Session: 50 min  Stress: No Stress Concern Present   Feeling of Stress : Not at all  Social Connections: Moderately Isolated   Frequency of Communication with Friends and Family: Three times a week   Frequency of Social Gatherings with Friends and Family: Three times a week   Attends Religious Services: Never   Active Member of Clubs or Organizations: No   Attends Archivist Meetings:  Never   Marital Status: Married  Human resources officer Violence: Not At Risk   Fear of Current or Ex-Partner: No   Emotionally Abused: No   Physically Abused: No   Sexually Abused: No    Past Surgical History:  Procedure Laterality Date   COLONOSCOPY     last one 2019 approx   ELBOW SURGERY Right 2007   LEFT HEART CATH AND CORONARY ANGIOGRAPHY N/A 05/18/2020   Procedure: LEFT HEART CATH AND CORONARY ANGIOGRAPHY;  Surgeon: Martinique, Peter M, MD;  Location: [facility];  Service: Cardiovascular;  Laterality: N/A;   PROSTATE BIOPSY N/A 01/21/2016   Procedure: BIOPSY TRANSRECTAL ULTRASONIC PROSTATE (TUBP);  Surgeon: Raynelle Bring, MD;  Location: [facility];  Service: Urology;  Laterality: N/A;   PROSTATE BIOPSY N/A 07/15/2018   Procedure: NEEDLE  BIOPSY TRANSRECTAL ULTRASONIC PROSTATE (TUBP);  Surgeon: Raynelle Bring, MD;  Location: [facility];  Service: Urology;  Laterality: N/A;   PROSTATE BIOPSY N/A 08/13/2020   Procedure: BIOPSY TRANSRECTAL ULTRASONIC PROSTATE (TUBP);  Surgeon: Raynelle Bring, MD;  Location: [facility];  Service: Urology;  Laterality: N/A;    Family History  Problem Relation Age of Onset   Heart disease Mother    Hypertension Mother    Glaucoma Mother    Arthritis Father    Heart disease Father    Hypertension Father    Bladder Cancer Father 24   Colon polyps Father    Dementia Brother    Lymphoma Cousin    Prostate cancer Cousin        mother's first cousin   Colon cancer Neg Hx    Esophageal cancer Neg Hx    Rectal cancer Neg Hx    Stomach cancer Neg Hx    Breast cancer Neg Hx     Allergies  Allergen Reactions   Codeine Nausea And Vomiting    Current Outpatient Medications on File Prior to Visit  Medication Sig Dispense Refill   acetaminophen (TYLENOL) 500 MG tablet Take 1,000 mg by mouth every 6 (six) hours as needed for moderate pain.     isosorbide mononitrate (IMDUR) 30 MG 24 hr tablet Take 0.5 tablets (15 mg total) by mouth daily. 45  tablet 3   metoprolol succinate (TOPROL XL) 25 MG 24 hr tablet Take 1 tablet (25 mg total) by mouth daily. 90 tablet 3   metoprolol tartrate (LOPRESSOR) 25 MG tablet Take 1 tablet (25 mg total) by mouth 3 (three) times daily as needed (for break through heart rate greater than 100.). 90 tablet 2   Multiple Vitamin (MULTIVITAMIN) tablet Take 1 tablet by mouth daily.     nitroGLYCERIN (NITROSTAT) 0.4 MG SL tablet Place 1 tablet (0.4 mg total) under the tongue every 5 (five) minutes as needed for chest pain. 30 tablet 3   oxybutynin (DITROPAN-XL) 10 MG 24 hr tablet Take 10 mg by mouth at bedtime.     rivaroxaban (XARELTO) 20 MG TABS tablet Take 1 tablet (20 mg total) by mouth daily with supper. 28 tablet 0   rosuvastatin (CRESTOR) 20 MG tablet Take 1 tablet (20 mg total) by mouth daily. 90 tablet 3   traZODone (DESYREL) 50 MG tablet TAKE 1 TABLET (50 MG TOTAL) BY MOUTH AT BEDTIME AS NEEDED. 90 tablet 1   trolamine salicylate (ASPERCREME) 10 % cream Apply 1 application topically as needed for muscle pain.     Turmeric 1053 MG TABS Take 3 tablets by mouth daily.     losartan (COZAAR) 50 MG tablet Take 1 tablet (50 mg total) by mouth daily. 90 tablet 3   No current facility-administered medications on file prior to visit.    BP 140/80    Pulse 86    Temp 98 F (36.7 C) (Oral)    Ht 5\' 7"  (1.702 m)    Wt 192 lb (87.1 kg)    SpO2 98%    BMI 30.07 kg/m        Objective:   Physical Exam Vitals and nursing note reviewed.  Constitutional:      General: He is not in acute distress.    Appearance: Normal appearance. He is well-developed and overweight.  HENT:     Head: Normocephalic and atraumatic.     Right Ear: Tympanic membrane, ear canal and external ear normal. There is no impacted  cerumen.     Left Ear: Tympanic membrane, ear canal and external ear normal. There is no impacted cerumen.     Nose: Nose normal. No congestion or rhinorrhea.     Mouth/Throat:     Mouth: Mucous membranes are  moist.     Pharynx: Oropharynx is clear. No oropharyngeal exudate or posterior oropharyngeal erythema.  Eyes:     General:        Right eye: No discharge.        Left eye: No discharge.     Extraocular Movements: Extraocular movements intact.     Conjunctiva/sclera: Conjunctivae normal.     Pupils: Pupils are equal, round, and reactive to light.  Neck:     Vascular: No carotid bruit.     Trachea: No tracheal deviation.  Cardiovascular:     Rate and Rhythm: Normal rate. Rhythm irregularly irregular.     Pulses: Normal pulses.     Heart sounds: Normal heart sounds. No murmur heard.   No friction rub. No gallop.  Pulmonary:     Effort: Pulmonary effort is normal. No respiratory distress.     Breath sounds: Normal breath sounds. No stridor. No wheezing, rhonchi or rales.  Chest:     Chest wall: No tenderness.  Abdominal:     General: Bowel sounds are normal. There is no distension.     Palpations: Abdomen is soft. There is no mass.     Tenderness: There is no abdominal tenderness. There is no right CVA tenderness, left CVA tenderness, guarding or rebound.     Hernia: No hernia is present.  Musculoskeletal:        General: No swelling, tenderness, deformity or signs of injury. Normal range of motion.     Right lower leg: No edema.     Left lower leg: No edema.  Lymphadenopathy:     Cervical: No cervical adenopathy.  Skin:    General: Skin is warm and dry.     Capillary Refill: Capillary refill takes less than 2 seconds.     Coloration: Skin is not jaundiced or pale.     Findings: No bruising, erythema, lesion or rash.  Neurological:     General: No focal deficit present.     Mental Status: He is alert and oriented to person, place, and time.     Cranial Nerves: No cranial nerve deficit.     Sensory: No sensory deficit.     Motor: No weakness.     Coordination: Coordination normal.     Gait: Gait normal.     Deep Tendon Reflexes: Reflexes normal.  Psychiatric:        Mood  and Affect: Mood normal.        Behavior: Behavior normal.        Thought Content: Thought content normal.        Judgment: Judgment normal.      Assessment & Plan:  1. Primary hypertension - Controlled.  - CBC with Differential/Platelet; Future - Comprehensive metabolic panel; Future - Hemoglobin A1c; Future - Lipid panel; Future - TSH; Future  2. Persistent atrial fibrillation (HCC) - Continue with Xarelto and Metoprolol  - CBC with Differential/Platelet; Future - Comprehensive metabolic panel; Future - Hemoglobin A1c; Future - Lipid panel; Future - TSH; Future  3. Mixed hyperlipidemia - Consider increase in statin  - CBC with Differential/Platelet; Future - Comprehensive metabolic panel; Future - Hemoglobin A1c; Future - Lipid panel; Future - TSH; Future  4. Other insomnia - Continue  with Trazodone  - CBC with Differential/Platelet; Future - Comprehensive metabolic panel; Future - Hemoglobin A1c; Future - Lipid panel; Future - TSH; Future  5. Prostate cancer ([place]) - Follow up with Urology as directed for seed implant   6. OSA (obstructive sleep apnea)  - CBC with Differential/Platelet; Future - Comprehensive metabolic panel; Future - Hemoglobin A1c; Future - Lipid panel; Future - TSH; Future  7. Pre-diabetes - Consider medication therapy - CBC with Differential/Platelet; Future - Comprehensive metabolic panel; Future - Hemoglobin A1c; Future - Lipid panel; Future - TSH; Future  Dorothyann Peng, NP

## 2021-03-01 DIAGNOSIS — L820 Inflamed seborrheic keratosis: Secondary | ICD-10-CM | POA: Diagnosis not present

## 2021-03-01 DIAGNOSIS — Z85820 Personal history of malignant melanoma of skin: Secondary | ICD-10-CM | POA: Diagnosis not present

## 2021-03-01 DIAGNOSIS — D1801 Hemangioma of skin and subcutaneous tissue: Secondary | ICD-10-CM | POA: Diagnosis not present

## 2021-03-01 DIAGNOSIS — L821 Other seborrheic keratosis: Secondary | ICD-10-CM | POA: Diagnosis not present

## 2021-03-01 DIAGNOSIS — M71342 Other bursal cyst, left hand: Secondary | ICD-10-CM | POA: Diagnosis not present

## 2021-03-01 DIAGNOSIS — D044 Carcinoma in situ of skin of scalp and neck: Secondary | ICD-10-CM | POA: Diagnosis not present

## 2021-03-01 DIAGNOSIS — M71341 Other bursal cyst, right hand: Secondary | ICD-10-CM | POA: Diagnosis not present

## 2021-03-01 DIAGNOSIS — L918 Other hypertrophic disorders of the skin: Secondary | ICD-10-CM | POA: Diagnosis not present

## 2021-03-01 DIAGNOSIS — Z85828 Personal history of other malignant neoplasm of skin: Secondary | ICD-10-CM | POA: Diagnosis not present

## 2021-03-01 DIAGNOSIS — L814 Other melanin hyperpigmentation: Secondary | ICD-10-CM | POA: Diagnosis not present

## 2021-03-01 DIAGNOSIS — L570 Actinic keratosis: Secondary | ICD-10-CM | POA: Diagnosis not present

## 2021-03-01 DIAGNOSIS — D485 Neoplasm of uncertain behavior of skin: Secondary | ICD-10-CM | POA: Diagnosis not present

## 2021-03-10 ENCOUNTER — Other Ambulatory Visit: Payer: Self-pay | Admitting: Adult Health

## 2021-03-10 DIAGNOSIS — I4819 Other persistent atrial fibrillation: Secondary | ICD-10-CM

## 2021-03-18 ENCOUNTER — Other Ambulatory Visit: Payer: Self-pay

## 2021-03-18 MED ORDER — LOSARTAN POTASSIUM 50 MG PO TABS: 50.0000 mg | ORAL_TABLET | Freq: Every day | ORAL | 3 refills | Status: DC

## 2021-03-21 ENCOUNTER — Telehealth: Payer: Self-pay | Admitting: Pharmacist

## 2021-03-21 NOTE — Chronic Care Management (AMB) (Addendum)
Chronic Care Management Pharmacy Assistant   Name: James Edwards  MRN: [id number] DOB: 02/17/1951  Reason for Encounter: Disease State   Conditions to be addressed/monitored: HTN  Recent office visits:  02/19/21 Dorothyann Peng, NP - Patient presented for Primary hypertension and other concerns.  Stopped Vitamin B-12 and Gin Raisin  Recent consult visits:  01/24/21 Freeman Caldron, PA-C (Rad Oncology) - Patient presented for Follow up. No medication changes noted. CT Simulation done  01/18/21 Gwyndolyn Kaufman E. (Cardiology) - Patient presented for persistent atrial fibrillation and other concerns. Prescribed Isosorbide Mononitrate 15 mg.   12/28/20 Clarene Essex, Counselor (Oncology) - Patient presented for Genetic Counseling. No medication changes.  12/24/20 Deneise Lever, MD (Pulmonology) - Patient presented for OSA and other concerns. No medication changes.  Hospital visits:  None in previous 6 months  Medications: Outpatient Encounter Medications as of 03/21/2021  Medication Sig   acetaminophen (TYLENOL) 500 MG tablet Take 1,000 mg by mouth every 6 (six) hours as needed for moderate pain.   isosorbide mononitrate (IMDUR) 30 MG 24 hr tablet Take 0.5 tablets (15 mg total) by mouth daily.   losartan (COZAAR) 50 MG tablet Take 1 tablet (50 mg total) by mouth daily.   metoprolol succinate (TOPROL XL) 25 MG 24 hr tablet Take 1 tablet (25 mg total) by mouth daily.   metoprolol tartrate (LOPRESSOR) 25 MG tablet Take 1 tablet (25 mg total) by mouth 2 (two) times daily.   Multiple Vitamin (MULTIVITAMIN) tablet Take 1 tablet by mouth daily.   nitroGLYCERIN (NITROSTAT) 0.4 MG SL tablet Place 1 tablet (0.4 mg total) under the tongue every 5 (five) minutes as needed for chest pain.   oxybutynin (DITROPAN-XL) 10 MG 24 hr tablet Take 10 mg by mouth at bedtime.   rivaroxaban (XARELTO) 20 MG TABS tablet Take 1 tablet (20 mg total) by mouth daily with supper.   rosuvastatin (CRESTOR) 20  MG tablet Take 1 tablet (20 mg total) by mouth daily.   traZODone (DESYREL) 50 MG tablet TAKE 1 TABLET (50 MG TOTAL) BY MOUTH AT BEDTIME AS NEEDED.   trolamine salicylate (ASPERCREME) 10 % cream Apply 1 application topically as needed for muscle pain.   Turmeric 1053 MG TABS Take 3 tablets by mouth daily.   No facility-administered encounter medications on file as of 03/21/2021.  Reviewed chart prior to disease state call. Spoke with patient regarding BP  Recent Office Vitals: BP Readings from Last 3 Encounters:  02/19/21 140/80  01/18/21 126/70  12/24/20 138/76   Pulse Readings from Last 3 Encounters:  02/19/21 86  01/18/21 78  12/24/20 91    Wt Readings from Last 3 Encounters:  02/19/21 192 lb (87.1 kg)  01/18/21 193 lb (87.5 kg)  12/24/20 190 lb 6.4 oz (86.4 kg)     Kidney Function Lab Results  Component Value Date/Time   CREATININE 0.94 02/19/2021 11:33 AM   CREATININE 0.83 08/13/2020 12:30 PM   GFR 81.61 02/19/2021 11:33 AM   GFRNONAA >60 08/13/2020 12:30 PM   GFRAA >60 07/09/2018 09:44 AM    BMP Latest Ref Rng & Units 02/19/2021 08/13/2020 05/16/2020  Glucose 70 - 99 mg/dL 108(H) 141(H) 136(H)  BUN 6 - 23 mg/dL 15 12 11   Creatinine 0.40 - 1.50 mg/dL 0.94 0.83 0.87  BUN/Creat Ratio 10 - 24 - - 13  Sodium 135 - 145 mEq/L 135 136 140  Potassium 3.5 - 5.1 mEq/L 4.5 4.2 4.6  Chloride 96 - 112 mEq/L 102  106 105  CO2 19 - 32 mEq/L 26 23 27   Calcium 8.4 - 10.5 mg/dL 9.9 10.5(H) 10.1     Current antihypertensive regimen:  Metoprolol tartrate 25 mg 1 tablet twice daily How often are you checking your Blood Pressure? weekly Current home BP readings:  BP Readings from Last 3 Encounters:  02/19/21 140/80  01/18/21 126/70  12/24/20 138/76   What recent interventions/DTPs have been made by any provider to improve Blood Pressure control since last CPP Visit: Patient reports none Any recent hospitalizations or ED visits since last visit with CPP? No Patient returned call  reports his pressures have been running fine most recently 138/68. He denies any hyper/hypotensive symptoms. He reports no concerns at this time  Adherence Review: Is the patient currently on ACE/ARB medication? No Does the patient have >5 day gap between last estimated fill dates? No     Care Gaps: BP 138/68 (03/28/21) TDAP- Overdue Zoster Vaccine - Overdue COVID Booster #4 (Moderna) - Overdue AVW- 9/22 CCM -5/23 Lab Results  Component Value Date   HGBA1C 6.0 02/19/2021    Star Rating Drugs: Rosuvastatin (Crestor) 20 mg - Last filled 11/18//2022 90 DS at [facility] Losartan (Cozaar) 50 mg - Last filled 03/18/21 90 DS at [facility] Pharmacist Assistant [phone]

## 2021-03-26 DIAGNOSIS — C61 Malignant neoplasm of prostate: Secondary | ICD-10-CM | POA: Diagnosis not present

## 2021-04-01 ENCOUNTER — Other Ambulatory Visit: Payer: Self-pay

## 2021-04-01 MED ORDER — ROSUVASTATIN CALCIUM 20 MG PO TABS: 20.0000 mg | ORAL_TABLET | Freq: Every day | ORAL | 3 refills | Status: DC

## 2021-04-08 ENCOUNTER — Encounter (HOSPITAL_BASED_OUTPATIENT_CLINIC_OR_DEPARTMENT_OTHER): Payer: Self-pay | Admitting: Urology

## 2021-04-08 ENCOUNTER — Other Ambulatory Visit: Payer: Self-pay

## 2021-04-08 NOTE — Progress Notes (Addendum)
ADDENDUM:"  pt had lab work done today and had abnormal PT/INR 1.7.  Entered order for PT/INR to be repeated dos.   Spoke w/ via phone for pre-op interview--- pt Lab needs dos----  no             Lab results------ pt getting lab work done 04-09-2021, CBC/ CMP/ PT/ PTT;;  current ekg in epic chart COVID test -----patient states asymptomatic no test needed Arrive at ------- 1015 opn 04-11-2021 NPO after MN NO Solid Food.  Clear liquids from MN until--- 0915  Med rec completed Medications to take morning of surgery ----- imdur, crestor, metoprolol Diabetic medication ----- n/a  Patient instructed to bring photo id and insurance card day of surgery Patient aware to have Driver (ride ) / caregiver   for 24 hours after surgery -- son, russell Patient Special Instructions ----- will do one fleet enema morning of surgery Pre-Op special Istructions ----- pt has telephone cardiac clearance from Almyra Deforest PA on 01-29-2021 in epic/ chart  Patient verbalized understanding of instructions that were given at this phone interview. Patient denies shortness of breath, chest pain, fever, cough at this phone interview.    Anesthesia Review: HTN;  Persistant AFib on xarelto and bb;  pre-diabetes;  mild OSA no cpap  Pt denies chest pain, sob, and no peripheral swelling. Stated last taken nitro x1 approx 4 wks ago for chest pain, none since.  PCP: Dr Carlisle Cater ([facility] [phone] epic) Cardiologist : Dr Johney Frame ([facility] 01-18-2021 epic Chest x-ray : no EKG : 01-18-2021 Echo : 04-15-2020 Event monitor:  05-08-2020 Stress test: no Cardiac Cath :  05-18-2020 Activity level:  denies sob w/ any activity Sleep Study/ CPAP : Yes/  no Fasting Blood Sugar :      / Checks Blood Sugar -- times a day:  does not check Blood Thinner/ Instructions Maryjane Hurter Dose: Xarelto ASA / Instructions/ Last Dose :  no Pt stated was given instructions to stop prior to surgery from Dr Alinda Money office, stated last dose 04-07-2021

## 2021-04-09 ENCOUNTER — Encounter (HOSPITAL_COMMUNITY)
Admission: RE | Admit: 2021-04-09 | Discharge: 2021-04-09 | Disposition: A | Discharge status: Home / Self Care | Payer: Medicare Other | Source: Ambulatory Visit | Attending: Urology | Admitting: Urology

## 2021-04-09 DIAGNOSIS — Z01812 Encounter for preprocedural laboratory examination: Secondary | ICD-10-CM | POA: Diagnosis not present

## 2021-04-09 LAB — COMPREHENSIVE METABOLIC PANEL
ALT: 32 U/L (ref 0–44)
AST: 33 U/L (ref 15–41)
Albumin: 3.9 g/dL (ref 3.5–5.0)
Alkaline Phosphatase: 30 U/L — ABNORMAL LOW (ref 38–126)
Anion gap: 6 (ref 5–15)
BUN: 18 mg/dL (ref 8–23)
CO2: 24 mmol/L (ref 22–32)
Calcium: 10 mg/dL (ref 8.9–10.3)
Chloride: 105 mmol/L (ref 98–111)
Creatinine, Ser: 1.06 mg/dL (ref 0.61–1.24)
GFR, Estimated: >60 mL/min (ref >60)
Glucose, Bld: 142 mg/dL — ABNORMAL HIGH (ref 70–99)
Potassium: 5.1 mmol/L (ref 3.5–5.1)
Sodium: 135 mmol/L (ref 135–145)
Total Bilirubin: 0.7 mg/dL (ref 0.3–1.2)
Total Protein: 6.7 g/dL (ref 6.5–8.1)

## 2021-04-09 LAB — CBC
HCT: 36.9 % — ABNORMAL LOW (ref 39.0–52.0)
Hemoglobin: 12.9 g/dL — ABNORMAL LOW (ref 13.0–17.0)
MCH: 34.4 pg — ABNORMAL HIGH (ref 26.0–34.0)
MCHC: 35 g/dL (ref 30.0–36.0)
MCV: 98.4 fL (ref 80.0–100.0)
Platelets: 198 10*3/uL (ref 150–400)
RBC: 3.75 MIL/uL — ABNORMAL LOW (ref 4.22–5.81)
RDW: 12.1 % (ref 11.5–15.5)
WBC: 7.3 10*3/uL (ref 4.0–10.5)
nRBC: 0 % (ref 0.0–0.2)

## 2021-04-09 LAB — PROTIME-INR
INR: 1.7 — ABNORMAL HIGH (ref 0.8–1.2)
Prothrombin Time: 20.2 seconds — ABNORMAL HIGH (ref 11.4–15.2)

## 2021-04-09 LAB — APTT: aPTT: 32 seconds (ref 24–36)

## 2021-04-10 ENCOUNTER — Telehealth: Payer: Self-pay | Admitting: *Deleted

## 2021-04-10 NOTE — H&P (Signed)
Office Visit Report     03/26/2021   --------------------------------------------------------------------------------   James Edwards  MRN: [id number]  DOB: Jun 25, 1950, 71 year old Male  SSN: [id number]   PRIMARY CARE:  Dorothyann Peng, NP  REFERRING:  Debbrah Alar  PROVIDER:  Raynelle Bring, M.D.  TREATING:  Mcarthur Rossetti, [place]  LOCATION:  [facility] [phone]     --------------------------------------------------------------------------------   CC/HPI: Pt presents today for pre-operative history and physical exam in anticipation of cystoscopy, brachytherapy, and space oar placement by Dr. Alinda Money on 04/11/21. He is doing well and is without complaint.   He received cards clearance and can stop his Xarelto 3 days prior to surgery.   Pt denies F/C, HA, CP, SOB, N/V, diarrhea/constipation, back pain, flank pain, hematuria, and dysuria.    HX:   CC: Prostate Cancer   PCP: Dorothyann Peng, NP  Location of consult: [facility]   James Edwards is a 71 year old gentleman with a past medical history of diabetes, hyperlipidemia, and hypertension. 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 He was counseled by myself and Dr. Tammi Klippel at that time and elected active surveillance.   Surveillance:  Jan 2012: MRI -- L medial apical nodule most suspicious area, No EPE, SVI, or LAD  Jan 2012: 4/22 cores positive -- L lateral apex (<5%, 3+3=6), L mid (2/2 cores, 25% and 5%, 3+3=6, PNI) L lateral mid (<5%, 3+3=6), Vol 38.0 cc  Aug 2013: 1/12 cores positive -- L mid (< 5%), multifocal HGPIN, Vol 39.5 cc  Sep 2015: 2/12 cores -- L mid (10%, 3+3=6, PNI), R lateral base (< 5%, 3+3=6), Vol 33.1 cc  Dec 2017: 1/12 core positive -- L apex (20%, 3+3=6), Vol 31 cc   May 2020: 3/12 cores positive -- L lateral apex (5%, 3+3=6), L apex (20%, 3+3=6), R mid (50%, 3+4=7), Vol 41.0 cc, PSAD 0.08 - elected to continue surveillance   He was noted to have upgraded Gleason 3+4=7 adenocarcinoma on his biopsy in May 2020 when his PSA was noted to be 3.25 still. His PSAD was 0.08 and he was again counseled by me and Dr. Tammi Klippel and still wished to proceed with surveillance. His PSA increased to 6.41 in January 2021 but then declined again but never back to baseline and was 4.21 prior to his most recent biopsy on 08/13/20. This biopsy indicated Gleason 3+4=7 adenocarcinoma again in 2 cores with 1 core also with Gleason 3+3=6 disease. He was noted to have only 10% pattern 4 disease in his Gleason 7 biopsy cores but he was noted to have PNI. His PSA remained above baseline at 4.48 when checked on September 2022.   He does have LUTS. He is currently taking oxybutynin for treatment of urinary urgency and frequency. IPSS is 14.   Family history: None.   Imaging studies: None.   PMH: He has a history of diabetes, hyperlipidemia, and hypertension.  PSH: No abdominal surgeries.   TNM stage: cT1c Nx Mx  PSA: 4.48  Gleason score: 3+4=7 (GG 2)  Biopsy (08/13/20): 3/12 cores positive  Left: L medial apex (10%, 3+4=7), L lateral mid (20%, 3+4=7, PNI),  Right: R mid (5%, 3+3=6)  Prostate volume: 46.1 cc  PSAD: 0.10   Nomogram  OC disease:  66%  EPE: 33%  SVI: 3%  LNI: 3%  PFS (5 year, 10 year): 87%, 78%   Urinary function: IPSS is 14.  Erectile function: SHIM score is 10.     ALLERGIES: Codeine Derivatives - stomach upset    MEDICATIONS: Cialis 20 mg tablet 1 tablet PO PRN  Oxybutynin Chloride Er 10 mg tablet, extended release 24 hr TAKE ONE TABLET BY MOUTH ONE TIME DAILY  Oxybutynin Chloride Er 10 mg tablet, extended release 24 hr TAKE ONE TABLET BY MOUTH ONE TIME DAILY  Multiple Vitamin  Quinapril Hcl 40 mg tablet Oral  Trazodone Hcl  Turmeric  Xarelto     GU  PSH: Prostate Needle Biopsy - 08/13/2020, 2020, 2017       [place] Notes: Elbow Arthroscopy   NON-GU PSH: Elbow Arthroscopy/surgery - 2011     GU PMH: Prostate Cancer - 12/11/2020, - 03/23/2020, Prostate cancer, - 2017 Urinary Urgency - 12/11/2020, - 03/23/2020, - 2018 BPH w/LUTS - 03/23/2020, - 2018 Nocturia - 2018 ED due to arterial insufficiency, Erectile dysfunction due to arterial insufficiency - 2017      PMH Notes: AFIB Cards is Dr. Geraldine Solar   Melanoma removed 6 months ago from back. Negative margins         ** Cannot tolerate biopsies in the office even despite oral sedation (requires IV sedation in OR)   1) Prostate cancer: He was noted to have an increase in his PSA from 1.3 in November 2008 up to 3.04 in May 2011. His PSA was rechecked when I first evaluated him in June 2011 and remained elevated at 3.53. 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 He has no family history of prostate cancer. He has been thoroughly counseled by myself and Dr. Tammi Klippel about his treatment options and elected active surveillance. He was found to have upgraded but low volume Gleason 3+4=7 disease in May 2020. He was again counseled by Dr. Tammi Klippel and me and elected to continue active surveillance.   Initial diagnosis: July 2011  TNM stage: cT1c Nx Mx  PSA at diagnosis: 3.53  Gleason score: 3+3=6  Biopsy ( 09/04/09): 2/12 cores -- L mid (10%), R lateral mid (5%)  Prostate volume: 37.7 cc  PSAD: 0.09   Surveillance:  Jan 2012: MRI -- L medial apical nodule most suspicious area, No EPE, SVI, or LAD  Jan 2012: 4/22 cores positive -- L lateral apex (<5%, 3+3=6), L mid (2/2 cores, 25% and 5%, 3+3=6, PNI) L lateral mid (<5%, 3+3=6), Vol 38.0 cc  Aug 2013: 1/12 cores positive -- L mid (< 5%), multifocal HGPIN, Vol 39.5 cc  Sep 2015: 2/12 cores -- L mid (10%, 3+3=6, PNI), R lateral base (< 5%, 3+3=6), Vol 33.1 cc   Dec 2017: 1/12 core positive -- L apex (20%, 3+3=6), Vol 31 cc  May 2020: 3/12 cores positive -- L lateral apex (5%, 3+3=6), L apex (20%, 3+3=6), R mid (50%, 3+4=7), Vol 41.0 cc, PSAD 0.08 - elected to continue surveillance    Urinary function (2020): IPSS: 15. He has very little bother from his symptoms which include a weak stream, urgency, and nocturia.  Erectile function (2020): SHIM score 10.   2) BPH/LUTS: His baseline symptoms include frequency, urgency, and nocturia.   Current treatment:  Prior treatment: Vesicare 10 mg (helpful but not covered), Myrbetriq 50 mg   NON-GU PMH: Diabetes Type 2 Hypercholesterolemia Hypertension    FAMILY  HISTORY: nephrolithiasis - Father Prostate Cancer - No Family History    Notes: Bladder cancer father    SOCIAL HISTORY: Marital Status: Married Preferred Language: English; Ethnicity: Not Hispanic Or Latino; Race: White Current Smoking Status: Patient does not smoke anymore.   Tobacco Use Assessment Completed: Used Tobacco in last 30 days? Does not use smokeless tobacco. Does drink.  Does not use drugs. Drinks 2 caffeinated drinks per day.     Notes: Quit smoking 15 years ago  Beer 6-7 per day    REVIEW OF SYSTEMS:    GU Review Male:   Patient denies frequent urination, hard to postpone urination, burning/ pain with urination, get up at night to urinate, leakage of urine, stream starts and stops, trouble starting your stream, have to strain to urinate , erection problems, and penile pain.  Gastrointestinal (Upper):   Patient denies nausea, vomiting, and indigestion/ heartburn.  Gastrointestinal (Lower):   Patient denies diarrhea and constipation.  Constitutional:   Patient denies fever, night sweats, weight loss, and fatigue.  Skin:   Patient denies skin rash/ lesion and itching.  Eyes:   Patient denies blurred vision and double vision.  Ears/ Nose/ Throat:   Patient denies sore throat and sinus problems.  Hematologic/Lymphatic:    Patient denies swollen glands and easy bruising.  Cardiovascular:   Patient reports chest pains. Patient denies leg swelling.  Respiratory:   Patient denies cough and shortness of breath.  Endocrine:   Patient denies excessive thirst.  Musculoskeletal:   Patient denies back pain and joint pain.  Neurological:   Patient denies headaches and dizziness.  Psychologic:   Patient denies depression and anxiety.   Notes: on occasion lasts 2 minutes or less no radiation--cards aware     VITAL SIGNS:      03/26/2021 01:41 PM  Weight 190 lb / 86.18 kg  Height 70 in / 177.8 cm  BP 135/68 mmHg  Pulse 94 /min  Temperature 97.3 F / 36.2 C  BMI 27.3 kg/m   MULTI-SYSTEM PHYSICAL EXAMINATION:    Constitutional: Well-nourished. No physical deformities. Normally developed. Good grooming.  Neck: Neck symmetrical, not swollen. Normal tracheal position.  Respiratory: Normal breath sounds. No labored breathing, no use of accessory muscles.   Cardiovascular: Abnormal heart rhythm. Irr irr; no murmur no rub  Lymphatic: No enlargement of neck, axillae, groin.  Skin: No paleness, no jaundice, no cyanosis. No lesion, no ulcer, no rash.  Neurologic / Psychiatric: Oriented to time, oriented to place, oriented to person. No depression, no anxiety, no agitation.  Gastrointestinal: No mass, no tenderness, no rigidity, non obese abdomen.  Eyes: Normal conjunctivae. Normal eyelids.  Ears, Nose, Mouth, and Throat: Left ear no scars, no lesions, no masses. Right ear no scars, no lesions, no masses. Nose no scars, no lesions, no masses. Normal hearing. Normal lips.  Musculoskeletal: Normal gait and station of head and neck.     Complexity of Data:  Records Review:   Previous Patient Records  Urine Test Review:   Urinalysis   03/26/21  Urinalysis  Urine Appearance Clear   Urine Color Yellow   Urine Glucose Neg mg/dL  Urine Bilirubin Neg mg/dL  Urine Ketones Neg mg/dL  Urine Specific Gravity 1.020   Urine Blood  Neg ery/uL  Urine pH 5.5   Urine Protein Neg mg/dL  Urine Urobilinogen 0.2 mg/dL  Urine Nitrites Neg   Urine Leukocyte Esterase Neg leu/uL   PROCEDURES:          Urinalysis -  81003 Dipstick Dipstick Cont'd  Color: Yellow Bilirubin: Neg  Appearance: Clear Ketones: Neg  Specific Gravity: 1.020 Blood: Neg  pH: 5.5 Protein: Neg  Glucose: Neg Urobilinogen: 0.2    Nitrites: Neg    Leukocyte Esterase: Neg    Notes:      ASSESSMENT:      ICD-10 Details  1 GU:   Prostate Cancer - C61    PLAN:            Medications Refill Meds: Oxybutynin Chloride Er 10 mg tablet, extended release 24 hr TAKE ONE TABLET BY MOUTH ONE TIME DAILY   #30  1 Refill(s)  Oxybutynin Chloride Er 10 mg tablet, extended release 24 hr TAKE ONE TABLET BY MOUTH ONE TIME DAILY   #30  1 Refill(s)            Schedule Return Visit/Planned Activity: Keep Scheduled Appointment - Schedule Surgery          Document Letter(s):  Created for Patient: Clinical Summary         Notes:   There are no changes in the patients history or physical exam since last evaluation by Dr. Alinda Money. Pt is scheduled to undergo cysto, brachytherapy, and space oar on 04/11/21.   All pt's questions were answered to the best of my ability.          Next Appointment:      Next Appointment: 04/11/2021 12:15 PM    Appointment Type: Surgery     Location: [facility] [phone]    Provider: Raynelle Bring, M.D.    Reason for Visit: NE/OP CYSTO, RAD SEED AND SPACE OAR      * Signed by Mcarthur Rossetti, PA on 03/27/21 at 8:31 PM (EST)*

## 2021-04-10 NOTE — Telephone Encounter (Signed)
CALLED PATIENT TO REMIND OF PROCEDURE FOR 04-11-21, SPOKE WITH PATIENT AND HE IS AWARE OF THIS PROCEDURE

## 2021-04-11 ENCOUNTER — Ambulatory Visit (HOSPITAL_COMMUNITY): Payer: Medicare Other

## 2021-04-11 ENCOUNTER — Encounter (HOSPITAL_BASED_OUTPATIENT_CLINIC_OR_DEPARTMENT_OTHER): Payer: Self-pay | Admitting: Urology

## 2021-04-11 ENCOUNTER — Ambulatory Visit (HOSPITAL_BASED_OUTPATIENT_CLINIC_OR_DEPARTMENT_OTHER)
Admission: RE | Admit: 2021-04-11 | Discharge: 2021-04-11 | Disposition: A | Discharge status: Home / Self Care | Payer: Medicare Other | Attending: Urology | Admitting: Urology

## 2021-04-11 ENCOUNTER — Other Ambulatory Visit: Payer: Self-pay

## 2021-04-11 ENCOUNTER — Ambulatory Visit (HOSPITAL_BASED_OUTPATIENT_CLINIC_OR_DEPARTMENT_OTHER): Payer: Medicare Other | Admitting: Anesthesiology

## 2021-04-11 ENCOUNTER — Encounter (HOSPITAL_BASED_OUTPATIENT_CLINIC_OR_DEPARTMENT_OTHER)
Admission: RE | Disposition: A | Discharge status: Home / Self Care | Payer: Self-pay | Source: Home / Self Care | Attending: Urology

## 2021-04-11 DIAGNOSIS — Z87891 Personal history of nicotine dependence: Secondary | ICD-10-CM | POA: Diagnosis not present

## 2021-04-11 DIAGNOSIS — E119 Type 2 diabetes mellitus without complications: Secondary | ICD-10-CM | POA: Diagnosis not present

## 2021-04-11 DIAGNOSIS — I10 Essential (primary) hypertension: Secondary | ICD-10-CM | POA: Insufficient documentation

## 2021-04-11 DIAGNOSIS — I2510 Atherosclerotic heart disease of native coronary artery without angina pectoris: Secondary | ICD-10-CM | POA: Insufficient documentation

## 2021-04-11 DIAGNOSIS — I4891 Unspecified atrial fibrillation: Secondary | ICD-10-CM | POA: Insufficient documentation

## 2021-04-11 DIAGNOSIS — R350 Frequency of micturition: Secondary | ICD-10-CM | POA: Diagnosis not present

## 2021-04-11 DIAGNOSIS — G4730 Sleep apnea, unspecified: Secondary | ICD-10-CM | POA: Diagnosis not present

## 2021-04-11 DIAGNOSIS — Z7901 Long term (current) use of anticoagulants: Secondary | ICD-10-CM | POA: Insufficient documentation

## 2021-04-11 DIAGNOSIS — C61 Malignant neoplasm of prostate: Secondary | ICD-10-CM

## 2021-04-11 DIAGNOSIS — N401 Enlarged prostate with lower urinary tract symptoms: Secondary | ICD-10-CM | POA: Diagnosis not present

## 2021-04-11 DIAGNOSIS — R351 Nocturia: Secondary | ICD-10-CM | POA: Diagnosis not present

## 2021-04-11 DIAGNOSIS — Z01818 Encounter for other preprocedural examination: Secondary | ICD-10-CM

## 2021-04-11 DIAGNOSIS — D630 Anemia in neoplastic disease: Secondary | ICD-10-CM

## 2021-04-11 HISTORY — PX: SPACE OAR INSTILLATION: SHX6769

## 2021-04-11 HISTORY — DX: Mixed hyperlipidemia: E78.2

## 2021-04-11 HISTORY — PX: RADIOACTIVE SEED IMPLANT: SHX5150

## 2021-04-11 HISTORY — DX: Obstructive sleep apnea (adult) (pediatric): G47.33

## 2021-04-11 HISTORY — DX: Atherosclerotic heart disease of native coronary artery without angina pectoris: I25.10

## 2021-04-11 SURGERY — INSERTION, RADIATION SOURCE, PROSTATE
Anesthesia: General | Site: Prostate

## 2021-04-11 MED ORDER — TAMSULOSIN HCL 0.4 MG PO CAPS: 0.4000 mg | ORAL_CAPSULE | Freq: Every day | ORAL | 0 refills | Status: DC

## 2021-04-11 MED ORDER — DEXAMETHASONE SODIUM PHOSPHATE 10 MG/ML IJ SOLN
INTRAMUSCULAR | Status: DC | PRN
Start: 2021-04-11 — End: 2021-04-11
  Administered 2021-04-11: 5 mg via INTRAVENOUS

## 2021-04-11 MED ORDER — SODIUM CHLORIDE (PF) 0.9 % IJ SOLN
INTRAMUSCULAR | Status: DC | PRN
  Administered 2021-04-11: 10 mL via INTRAVENOUS

## 2021-04-11 MED ORDER — STERILE WATER FOR IRRIGATION IR SOLN
Status: DC | PRN
  Administered 2021-04-11: 3 mL

## 2021-04-11 MED ORDER — PROPOFOL 10 MG/ML IV BOLUS
INTRAVENOUS | Status: DC | PRN
  Administered 2021-04-11: 130 mg via INTRAVENOUS

## 2021-04-11 MED ORDER — TRAMADOL HCL 50 MG PO TABS: 50.0000 mg | ORAL_TABLET | Freq: Four times a day (QID) | ORAL | 0 refills | Status: DC | PRN

## 2021-04-11 MED ORDER — DEXAMETHASONE SODIUM PHOSPHATE 10 MG/ML IJ SOLN
INTRAMUSCULAR | Status: AC
  Filled 2021-04-11: qty 1

## 2021-04-11 MED ORDER — LIDOCAINE 2% (20 MG/ML) 5 ML SYRINGE
INTRAMUSCULAR | Status: DC | PRN
Start: 2021-04-11 — End: 2021-04-11
  Administered 2021-04-11: 60 mg via INTRAVENOUS

## 2021-04-11 MED ORDER — ROCURONIUM BROMIDE 10 MG/ML (PF) SYRINGE
PREFILLED_SYRINGE | INTRAVENOUS | Status: DC | PRN
  Administered 2021-04-11: 10 mg via INTRAVENOUS
  Administered 2021-04-11: 70 mg via INTRAVENOUS

## 2021-04-11 MED ORDER — PHENYLEPHRINE 40 MCG/ML (10ML) SYRINGE FOR IV PUSH (FOR BLOOD PRESSURE SUPPORT)
PREFILLED_SYRINGE | INTRAVENOUS | Status: AC
  Filled 2021-04-11: qty 10

## 2021-04-11 MED ORDER — FLEET ENEMA 7-19 GM/118ML RE ENEM
1.0000 | ENEMA | Freq: Once | RECTAL | Status: DC
Start: 2021-04-12 — End: 2021-04-11

## 2021-04-11 MED ORDER — CIPROFLOXACIN IN D5W 400 MG/200ML IV SOLN
INTRAVENOUS | Status: AC
  Filled 2021-04-11: qty 200

## 2021-04-11 MED ORDER — LIDOCAINE HCL (PF) 2 % IJ SOLN
INTRAMUSCULAR | Status: AC
  Filled 2021-04-11: qty 5

## 2021-04-11 MED ORDER — ONDANSETRON HCL 4 MG/2ML IJ SOLN: 4.0000 mg | Freq: Once | INTRAMUSCULAR | Status: DC | PRN

## 2021-04-11 MED ORDER — IOHEXOL 300 MG/ML  SOLN
INTRAMUSCULAR | Status: DC | PRN
Start: 2021-04-11 — End: 2021-04-11
  Administered 2021-04-11: 7 mL

## 2021-04-11 MED ORDER — MIDAZOLAM HCL 2 MG/2ML IJ SOLN
INTRAMUSCULAR | Status: AC
  Filled 2021-04-11: qty 2

## 2021-04-11 MED ORDER — ROCURONIUM BROMIDE 10 MG/ML (PF) SYRINGE
PREFILLED_SYRINGE | INTRAVENOUS | Status: AC
  Filled 2021-04-11: qty 10

## 2021-04-11 MED ORDER — FENTANYL CITRATE (PF) 100 MCG/2ML IJ SOLN: 25.0000 ug | INTRAMUSCULAR | Status: DC | PRN

## 2021-04-11 MED ORDER — AMISULPRIDE (ANTIEMETIC) 5 MG/2ML IV SOLN: 10.0000 mg | Freq: Once | INTRAVENOUS | Status: DC | PRN

## 2021-04-11 MED ORDER — FENTANYL CITRATE (PF) 250 MCG/5ML IJ SOLN
INTRAMUSCULAR | Status: DC | PRN
  Administered 2021-04-11: 100 ug via INTRAVENOUS
  Administered 2021-04-11 (×2): 50 ug via INTRAVENOUS

## 2021-04-11 MED ORDER — CIPROFLOXACIN IN D5W 400 MG/200ML IV SOLN
400.0000 mg | INTRAVENOUS | Status: AC
  Administered 2021-04-11: 400 mg via INTRAVENOUS

## 2021-04-11 MED ORDER — OXYCODONE HCL 5 MG PO TABS: 5.0000 mg | ORAL_TABLET | Freq: Once | ORAL | Status: DC | PRN

## 2021-04-11 MED ORDER — ACETAMINOPHEN 500 MG PO TABS
ORAL_TABLET | ORAL | Status: AC
  Filled 2021-04-11: qty 2

## 2021-04-11 MED ORDER — SODIUM CHLORIDE 0.9 % IV SOLN
INTRAVENOUS | Status: AC | PRN
Start: 2021-04-11 — End: 2021-04-11
  Administered 2021-04-11: 300 mL

## 2021-04-11 MED ORDER — SUGAMMADEX SODIUM 200 MG/2ML IV SOLN
INTRAVENOUS | Status: DC | PRN
  Administered 2021-04-11: 300 mg via INTRAVENOUS

## 2021-04-11 MED ORDER — LACTATED RINGERS IV SOLN: INTRAVENOUS | Status: DC

## 2021-04-11 MED ORDER — OXYCODONE HCL 5 MG/5ML PO SOLN: 5.0000 mg | Freq: Once | ORAL | Status: DC | PRN

## 2021-04-11 MED ORDER — ONDANSETRON HCL 4 MG/2ML IJ SOLN
INTRAMUSCULAR | Status: DC | PRN
  Administered 2021-04-11: 4 mg via INTRAVENOUS

## 2021-04-11 MED ORDER — FENTANYL CITRATE (PF) 100 MCG/2ML IJ SOLN
INTRAMUSCULAR | Status: AC
  Filled 2021-04-11: qty 2

## 2021-04-11 MED ORDER — ACETAMINOPHEN 500 MG PO TABS
1000.0000 mg | ORAL_TABLET | Freq: Once | ORAL | Status: AC
  Administered 2021-04-11: 1000 mg via ORAL

## 2021-04-11 MED ORDER — MIDAZOLAM HCL 2 MG/2ML IJ SOLN
INTRAMUSCULAR | Status: DC | PRN
  Administered 2021-04-11: 2 mg via INTRAVENOUS

## 2021-04-11 MED ORDER — ONDANSETRON HCL 4 MG/2ML IJ SOLN
INTRAMUSCULAR | Status: AC
  Filled 2021-04-11: qty 2

## 2021-04-11 MED ORDER — PHENYLEPHRINE 40 MCG/ML (10ML) SYRINGE FOR IV PUSH (FOR BLOOD PRESSURE SUPPORT)
PREFILLED_SYRINGE | INTRAVENOUS | Status: DC | PRN
  Administered 2021-04-11: 120 ug via INTRAVENOUS

## 2021-04-11 MED ORDER — PROPOFOL 10 MG/ML IV BOLUS
INTRAVENOUS | Status: AC
  Filled 2021-04-11: qty 20

## 2021-04-11 SURGICAL SUPPLY — 47 items
BAG DRN RND TRDRP ANRFLXCHMBR (UROLOGICAL SUPPLIES) ×2
BAG URINE DRAIN 2000ML AR STRL (UROLOGICAL SUPPLIES) ×3 IMPLANT
BLADE CLIPPER SENSICLIP SURGIC (BLADE) ×3 IMPLANT
CATH FOLEY 2WAY SLVR  5CC 16FR (CATHETERS) ×3
CATH FOLEY 2WAY SLVR 5CC 16FR (CATHETERS) ×2 IMPLANT
CATH ROBINSON RED A/P 16FR (CATHETERS) IMPLANT
CATH ROBINSON RED A/P 20FR (CATHETERS) ×3 IMPLANT
CLOTH BEACON ORANGE TIMEOUT ST (SAFETY) ×3 IMPLANT
COVER BACK TABLE 60X90IN (DRAPES) ×3 IMPLANT
COVER MAYO STAND STRL (DRAPES) ×3 IMPLANT
DRSG TEGADERM 4X4.75 (GAUZE/BANDAGES/DRESSINGS) ×3 IMPLANT
DRSG TEGADERM 8X12 (GAUZE/BANDAGES/DRESSINGS) ×3 IMPLANT
GAUZE SPONGE 4X4 12PLY STRL (GAUZE/BANDAGES/DRESSINGS) ×3 IMPLANT
GAUZE SPONGE 4X4 12PLY STRL LF (GAUZE/BANDAGES/DRESSINGS) ×1 IMPLANT
GEL ULTRASOUND 20GR AQUASONIC (MISCELLANEOUS) ×3 IMPLANT
GLOVE SURG ENC MOIS LTX SZ6.5 (GLOVE) ×3 IMPLANT
GLOVE SURG ENC MOIS LTX SZ7.5 (GLOVE) ×6 IMPLANT
GLOVE SURG ENC MOIS LTX SZ8 (GLOVE) IMPLANT
GLOVE SURG ORTHO LTX SZ8.5 (GLOVE) IMPLANT
GLOVE SURG UNDER POLY LF SZ6.5 (GLOVE) IMPLANT
GOWN STRL REUS W/TWL LRG LVL3 (GOWN DISPOSABLE) ×3 IMPLANT
GRID BRACH TEMP 18GA 2.8X3X.75 (MISCELLANEOUS) ×3 IMPLANT
HOLDER FOLEY CATH W/STRAP (MISCELLANEOUS) ×3 IMPLANT
IMPL SPACEOAR VUE SYSTEM (Spacer) ×2 IMPLANT
IMPLANT SPACEOAR VUE SYSTEM (Spacer) ×3 IMPLANT
IV NS 1000ML (IV SOLUTION) ×3
IV NS 1000ML BAXH (IV SOLUTION) ×2 IMPLANT
KIT TURNOVER CYSTO (KITS) ×3 IMPLANT
NDL BRACHY 18G 5PK (NEEDLE) ×8 IMPLANT
NDL BRACHY 18G SINGLE (NEEDLE) IMPLANT
NDL PK MORGANSTERN STABILIZ (NEEDLE) ×2 IMPLANT
NDL SPNL 22GX7 QUINCKE BK (NEEDLE) ×2 IMPLANT
NEEDLE BRACHY 18G 5PK (NEEDLE) ×12 IMPLANT
NEEDLE BRACHY 18G SINGLE (NEEDLE) IMPLANT
NEEDLE PK MORGANSTERN STABILIZ (NEEDLE) ×3 IMPLANT
NEEDLE SPNL 22GX7 QUINCKE BK (NEEDLE) ×3 IMPLANT
PACK CYSTO (CUSTOM PROCEDURE TRAY) ×3 IMPLANT
SHEATH ULTRASOUND LF (SHEATH) IMPLANT
SHEATH ULTRASOUND LTX NONSTRL (SHEATH) IMPLANT
SURGILUBE 2OZ TUBE FLIPTOP (MISCELLANEOUS) ×3 IMPLANT
SUT BONE WAX W31G (SUTURE) IMPLANT
SYR 10ML LL (SYRINGE) ×3 IMPLANT
SYR CONTROL 10ML LL (SYRINGE) ×3 IMPLANT
TOWEL OR 17X26 10 PK STRL BLUE (TOWEL DISPOSABLE) ×3 IMPLANT
UNDERPAD 30X36 HEAVY ABSORB (UNDERPADS AND DIAPERS) ×6 IMPLANT
WATER STERILE IRR 500ML POUR (IV SOLUTION) ×3 IMPLANT
bard quicklink cartridges with brachysource I-125 ×61 IMPLANT

## 2021-04-11 NOTE — Op Note (Signed)
Preoperative diagnosis: Clinically localized adenocarcinoma of the prostate (T1c Nx Mx)  Postoperative diagnosis: Clinically localized adenocarcinoma of the prostate  Procedure: 1) Transperineal placement of radioactive seeds into the prostate                    2) Cystoscopy                    3) Insertion of SpaceOAR hydrogel   Surgeon: Pryor Curia. M.D.  Radiation oncologist: Dr. Tyler Pita  Anesthesia: General  EBL: Minimal  Complications: None  Indication: James Edwards is a 71 y.o. gentleman with clinically localized prostate cancer. After discussing management options for treatment, he elected to proceed with radiotherapy. He presents today for the above procedures. The potential risks, complications, alternative options, and expected recovery course have been discussed in detail with the patient and he has provided informed consent to proceed.  Description of procedure: The patient was taken to the operating room and general anesthesia was induced. He was administered preoperative antibiotics, placed in the dorsal lithotomy position, and prepped and draped in the usual sterile fashion. Next, intraoperative transrectal ultrasonography was utilized for real-time intraoperative planning by the radiation oncology team. Once the treatment plan was completed and the seed strands created, stranded iodine 125 radiation seeds were placed utilizing a brachytherapy perineal template. 37 radioactive iodine 125 seeds into the prostate through 17 catheter needles.  The brachytherapy template was then removed.  A site in the midline was selected on the perineum for placement of an 18 g needle with saline.  The needle was advanced above the rectum and below Denonvillier's fascia to the mid gland and confirmed to be in the midline on transverse imaging.  One cc of saline was injected confirming appropriate expansion of this space.  A total of 5 cc of saline was then injected to open the  space further bilaterally.  The saline syringe was then removed and the SpaceOAR hydrogel was injected with good distribution bilaterally. Position of the radiation seeds was confirmed on fluoroscopic imaging.  Flexible cystoscopy was then performed and no seeds were identified within the bladder.  No bladder tumors, stones, or other mucosal pathology was identified within the bladder. He tolerated the procedure well and without complications. He was able to be transferred to the recovery unit in satisfactory condition.  He was given a voiding trial in the PACU.

## 2021-04-11 NOTE — Anesthesia Preprocedure Evaluation (Addendum)
Anesthesia Evaluation  Patient identified by MRN, date of birth, ID band Patient awake    Reviewed: Allergy & Precautions, NPO status , Patient's Chart, lab work & pertinent test results  History of Anesthesia Complications Negative for: history of anesthetic complications  Airway Mallampati: III  TM Distance: >3 FB Neck ROM: Full    Dental  (+) Teeth Intact, Dental Advisory Given   Pulmonary sleep apnea , former smoker,    Pulmonary exam normal        Cardiovascular hypertension, Pt. on medications and Pt. on home beta blockers + CAD  Normal cardiovascular exam+ dysrhythmias Atrial Fibrillation    Cath 05/18/20: 1. Minor nonobstructive CAD 2. Normal LV function 3. Normal LVEDP  Echo 04/13/20: EF 55% with inferior basal hypokinesis, no RWMA, nl RV size/fn, mod LAE, AV sclerosis w/o stenosis, valves otherwise unremarkable   Neuro/Psych negative neurological ROS     GI/Hepatic Neg liver ROS, GERD  ,  Endo/Other  negative endocrine ROS  Renal/GU negative Renal ROS   Prostate cancer    Musculoskeletal  (+) Arthritis ,   Abdominal   Peds  Hematology  (+) Blood dyscrasia, anemia , Xarelto, last dose 2/19   Anesthesia Other Findings   Reproductive/Obstetrics                            Anesthesia Physical Anesthesia Plan  ASA: 3  Anesthesia Plan: General   Post-op Pain Management: Tylenol PO (pre-op)* and Toradol IV (intra-op)*   Induction: Intravenous  PONV Risk Score and Plan: 2 and Ondansetron, Dexamethasone, Treatment may vary due to age or medical condition and Midazolam  Airway Management Planned: Oral ETT  Additional Equipment: None  Intra-op Plan:   Post-operative Plan: Extubation in OR  Informed Consent: I have reviewed the patients History and Physical, chart, labs and discussed the procedure including the risks, benefits and alternatives for the proposed anesthesia with  the patient or authorized representative who has indicated his/her understanding and acceptance.     Dental advisory given  Plan Discussed with:   Anesthesia Plan Comments:         Anesthesia Quick Evaluation

## 2021-04-11 NOTE — Anesthesia Procedure Notes (Signed)
Procedure Name: Intubation Date/Time: 04/11/2021 12:32 PM Performed by: Lollie Sails, CRNA Pre-anesthesia Checklist: Patient identified, Emergency Drugs available, Suction available, Patient being monitored and Timeout performed Patient Re-evaluated:Patient Re-evaluated prior to induction Oxygen Delivery Method: Circle system utilized Preoxygenation: Pre-oxygenation with 100% oxygen Induction Type: IV induction Ventilation: Mask ventilation without difficulty Laryngoscope Size: Miller and 3 Grade View: Grade III Tube type: Oral Tube size: 7.5 mm Number of attempts: 1 Airway Equipment and Method: Stylet Placement Confirmation: ETT inserted through vocal cords under direct vision, positive ETCO2 and breath sounds checked- equal and bilateral Secured at: 24 cm Tube secured with: Tape Dental Injury: Teeth and Oropharynx as per pre-operative assessment  Comments: Able to visualize base of laryngeal opening with pressure on thyroid.

## 2021-04-11 NOTE — Anesthesia Postprocedure Evaluation (Signed)
Anesthesia Post Note  Patient: James Edwards  Procedure(s) Performed: RADIOACTIVE SEED IMPLANT/BRACHYTHERAPY IMPLANT WITH CYSTOSCOPY (Prostate) SPACE OAR INSTILLATION (Perineum)     Patient location during evaluation: PACU Anesthesia Type: General Level of consciousness: awake and alert Pain management: pain level controlled Vital Signs Assessment: post-procedure vital signs reviewed and stable Respiratory status: spontaneous breathing, nonlabored ventilation and respiratory function stable Cardiovascular status: blood pressure returned to baseline and stable Postop Assessment: no apparent nausea or vomiting Anesthetic complications: no   No notable events documented.  Last Vitals:  Vitals:   04/11/21 1430 04/11/21 1504  BP: 132/85 135/78  Pulse: 79 76  Resp: 18 18  Temp:  36.4 C  SpO2: 96% 99%    Last Pain:  Vitals:   04/11/21 1504  TempSrc:   PainSc: 0-No pain                 Lidia Collum

## 2021-04-11 NOTE — Transfer of Care (Signed)
Immediate Anesthesia Transfer of Care Note  Patient: James Edwards  Procedure(s) Performed: RADIOACTIVE SEED IMPLANT/BRACHYTHERAPY IMPLANT WITH CYSTOSCOPY (Prostate) SPACE OAR INSTILLATION (Perineum)  Patient Location: PACU  Anesthesia Type:General  Level of Consciousness: awake, alert  and oriented  Airway & Oxygen Therapy: Patient Spontanous Breathing and Patient connected to face mask oxygen  Post-op Assessment: Report given to RN, Post -op Vital signs reviewed and stable and BP 132/80  Post vital signs: Reviewed and stable  Last Vitals:  Vitals Value Taken Time  BP    Temp    Pulse 91 04/11/21 1346  Resp 17 04/11/21 1346  SpO2 100 % 04/11/21 1346  Vitals shown include unvalidated device data.  Last Pain:  Vitals:   04/11/21 1047  TempSrc: Oral  PainSc: 0-No pain      Patients Stated Pain Goal: 5 (84/03/97 9536)  Complications: No notable events documented.

## 2021-04-11 NOTE — Interval H&P Note (Signed)
History and Physical Interval Note:  04/11/2021 12:22 PM  James Edwards  has presented today for surgery, with the diagnosis of PROSTATE CANCER.  The various methods of treatment have been discussed with the patient and family. After consideration of risks, benefits and other options for treatment, the patient has consented to  Procedure(s): RADIOACTIVE SEED IMPLANT/BRACHYTHERAPY IMPLANT WITH CYSTOSCOPY (N/A) SPACE OAR INSTILLATION (N/A) as a surgical intervention.  The patient's history has been reviewed, patient examined, no change in status, stable for surgery.  I have reviewed the patient's chart and labs.  Questions were answered to the patient's satisfaction.     [employer]

## 2021-04-11 NOTE — Discharge Instructions (Addendum)
You will be prescribed tamsulosin which is a medication to help you urinate over the next month.  You should call Dr. Lynne Logan office [phone]) if you feel you cannot empty your bladder well. Also, call if you develop fever > 101.  You will also be prescribed pain medication and should take an over the counter stool softener over the next week to avoid straining with bowel movements.  Followup with Dr. Alinda Money and your radiation oncologist as scheduled.     No acetaminophen/Tylenol until after 4:30pm today if needed.     Post Anesthesia Home Care Instructions  Activity: Get plenty of rest for the remainder of the day. A responsible individual must stay with you for 24 hours following the procedure.  For the next 24 hours, DO NOT: -Drive a car -Paediatric nurse -Drink alcoholic beverages -Take any medication unless instructed by your physician -Make any legal decisions or sign important papers.  Meals: Start with liquid foods such as gelatin or soup. Progress to regular foods as tolerated. Avoid greasy, spicy, heavy foods. If nausea and/or vomiting occur, drink only clear liquids until the nausea and/or vomiting subsides. Call your physician if vomiting continues.  Special Instructions/Symptoms: Your throat may feel dry or sore from the anesthesia or the breathing tube placed in your throat during surgery. If this causes discomfort, gargle with warm salt water. The discomfort should disappear within 24 hours.  Radioactive Seed Implant Home Care Instructions   Activity:    Rest for the remainder of the day.  Do not drive or operate equipment today.  You may resume normal  activities in a few days as instructed by your physician, without risk of harmful radiation exposure to those around you, provided you follow the time and distance precautions on the Radiation Oncology Instruction Sheet.   Meals: Drink plenty of lipuids and eat light foods, such as gelatin or soup this evening .   You may return to normal meal plan tomorrow.  Return To Work: You may return to work as instructed by Naval [employer].  Special Instruction:   If any seeds are found, use tweezers to pick up seeds and place in a glass container of any kind and bring to your physician's office.  Call your physician if any of these symptoms occur:  Persistent or heavy bleeding Urine stream diminishes or stops completely after catheter is removed Fever equal to or greater than 101 degrees F Cloudy urine with a strong foul odor Severe pain  You may feel some burning pain and/or hesitancy when you urinate after the catheter is removed.  These symptoms may increase over the next few weeks, but should diminish within forur to six weeks.  Applying moist heat to the lower abdomen or a hot tub bath may help relieve the pain.  If the discomfort becomes severe, please call your physician for additional medications.  PROSTATE CANCER TREATMENT WITH RADIOACTIVE IODINE-125 SEED IMPLANT  This instruction sheet is intended to discuss implantation of Iodine-125 seeds as treatment for cancer of the prostate. It will explain in detail what you may expect from this treatment and what precautions are necessary as a result of the treatment. Iodine-125 emits a relatively low energy radiation. The radioactive seeds are surgically implanted directly into the prostate gland. Most of the radiation is contained within the prostate gland. A very small amount is present outside the body.The precautions that we ask you to take are to ensure that those around you are protected from unnecessary radiation. The principles  of radiation safety that you need to understand are:  DISTANCE: The further a person is from the radioactive implant the less radiation they will be receiving. The amount of radiation received falls off quite rapidly with distance. More specific guidelines are given in the table on the last page.  TIME: The amount of radiation  a person is exposed to is directly proportional to the amount of time that is spent in close proximity to the radioactive implant. Very little radiation will be received during short periods. See the table on the last page for more specific guideline.  CHILDREN UNDER AGE 39 Children should not be allowed to sit on your lap or otherwise be in very close contact for more than a few minutes for the first 6-8 weeks following the implant. You may affectionately greet (hug/kiss) a child for a short period of time, but remember, the longer you are in close proximity with that child the more radiation they are being exposed to. At a distance of 6 feet there is no limit to the length of time you may spend together. See specific guidelines on the last page.  PREGNANT OR POSSIBLY PREGNANT WOMEN Pregnant women should avoid prolonged close physical contact with you for the first 6-8 weeks after implant. At a distance of 6 feet there is no limit to the length of time you may spend together. Pregnant women or possibly pregnant women can safely be in close contact with you for a limited period of time. See the last page for guidelines.  FAMILY RELATIONS You may sleep in the same bed as your partner (provided she is not pregnant or under the age of 72). Sexual intercourse, using a condom, may be resumed 2 weeks after the implant. Your semen may be discolored, dark brown or black. This is normal and is the result of bleeding that may have occurred during the implant. After 3-4 weeks it will not be necessary to use a condom.  DAILY ACTIVITIES You may resume normal activities in a few days (example: work, shopping, church) without the risk of harmful radiation exposure to those around you provided you keep in mind the time and distance precautions. Objects that you touch or item that you use do not become radioactive. Linens, clothing, tableware, and dishes may be used by other persons without special precautions. Your  bodily wastes (urine and stool) are not radioactive.  SPECIAL PRECAUTIONS It is possible to lose implanted Iodine-125 seed(s) through urination. Although it is possible to pass seeds indefinitely, it is most likely to occur immediately after catheter removal. To prevent this from happening the catheter that was in place during the implant procedure is removed immediately after the implant and a cystoscopy procedure is performed. The process of removing the catheter and the cystoscopy procedure should dislodge and remove any seeds that are not firmly imbedded in the prostate tissue. However, you should watch for seeds if/when you remove your catheter at home. The seeds are silver colored and the size of a grain of rice. In the unlikely event that a seed is seen after urination, simply flush the seed down the toilet. The seed should not be handled with your fingers, not even with a glove or napkin. A spoon or tweezers can be used to pick up a seed. The Radiation Oncology department is open Monday - Friday from 8:00 am to 5:30 pm with a Radiation Oncologist on call at all times. He or she may be reached by calling [phone]. If  you are to be hospitalized or if death should occur, your family should notify the Runner, broadcasting/film/video.  SIDE EFFECTS There are very few side effects associate with the implant procedure. Minor burning with urination, weak stream, hesitancy, intermittency, frequency, mild pain or feeling unable to pass your urine freely are common and usually stop in one to four months. If these symptoms are extremely uncomfortable, contact your physician.  RADIATION SAFETY GUIDELINES PROSTATE CANCER TREATMENT WITH RADIOACTIVE IODINE-125 SEED IMPLANT  The following guidelines will limit exposure to less than naturally occurring background radiation.  PERSONS AGE 59-45 (if able to become pregnant)  FOR 8 WEEKS FOLLOWING IMPLANT  At a distance of 1 foot: limit time to less than 2  hours/week At a distance of 3 feet: limit time to 20 hours/week At a distance of 6 feet: no restrictions  AFTER 8 WEEKS No restrictions  CHILDREN UNDER AGE 59, PREGNANT WOMEN OR POSSIBLY PREGNANT WOMEN  FOR 8 WEEKS FOLLOWING IMPLANT At a distance of 1 foot: limit time to 10 minutes/week At a distance of 3 feet: limit time to 2 hours/week At a distance of 6 feet: no restrictions  AFTER 8 WEEKS No restrictions  PERSONS OVER THE AGE OF 45 AND DO NOT EXPECT TO HAVE ANY MORE CHILDREN No restrictions  Updated by SCP in January 2020

## 2021-04-11 NOTE — Progress Notes (Signed)
Radiation Oncology         [phone] ________________________________  Name: James Edwards MRN: [id number]  Date: 04/11/2021  DOB: December 31, 1950       Prostate Seed Implant  [id number], James Rumps, NP  No ref. provider found  DIAGNOSIS:  71 y.o. gentleman with stage T1c adenocarcinoma of the prostate with a Gleason's score of 3+4 and a PSA of 4.48  Oncology History  Malignant neoplasm of prostate ([place])  10/18/2014 Initial Diagnosis   Malignant neoplasm of prostate ([place])   08/13/2020 Cancer Staging   Staging form: Prostate, AJCC 8th Edition - Clinical stage from 08/13/2020: Stage IIB (cT1c, cN0, cM0, PSA: 4.5, Grade Group: 2) - Signed by Freeman Caldron, PA-C on 12/11/2020 Histopathologic type: Adenocarcinoma, NOS Stage prefix: Initial diagnosis Prostate specific antigen (PSA) range: Less than 10 Gleason primary pattern: 3 Gleason secondary pattern: 4 Gleason score: 7 Histologic grading system: 5 grade system Number of biopsy cores examined: 12 Number of biopsy cores positive: 3 Location of positive needle core biopsies: Both sides    12/21/2020 Genetic Testing   Negative genetic testing on the CancerNext-Expanded+RNAinsight panel.  The report date is 12/21/2020.  The CancerNext-Expanded gene panel offered by [facility] and includes sequencing and rearrangement analysis for the following 77 genes: AIP, ALK, APC*, ATM*, AXIN2, BAP1, BARD1, BLM, BMPR1A, BRCA1*, BRCA2*, BRIP1*, CDC73, CDH1*, CDK4, CDKN1B, CDKN2A, CHEK2*, CTNNA1, DICER1, FANCC, FH, FLCN, GALNT12, KIF1B, LZTR1, MAX, MEN1, MET, MLH1*, MSH2*, MSH3, MSH6*, MUTYH*, NBN, NF1*, NF2, NTHL1, PALB2*, PHOX2B, PMS2*, POT1, PRKAR1A, PTCH1, PTEN*, RAD51C*, RAD51D*, RB1, RECQL, RET, SDHA, SDHAF2, SDHB, SDHC, SDHD, SMAD4, SMARCA4, SMARCB1, SMARCE1, STK11, SUFU, TMEM127, TP53*, TSC1, TSC2, VHL and XRCC2 (sequencing and deletion/duplication); EGFR, EGLN1, HOXB13, KIT, MITF, PDGFRA, POLD1, and POLE (sequencing only); EPCAM and GREM1  (deletion/duplication only). DNA and RNA analyses performed for * genes.        ICD-10-CM   1. Pre-op testing  Z01.818 CANCELED: Protime - INR    CANCELED: Protime - INR      PROCEDURE: Insertion of radioactive I-125 seeds into the prostate gland.  RADIATION DOSE: 145 Gy, definitive therapy.  TECHNIQUE: James Edwards was brought to the operating room with the urologist. He was placed in the dorsolithotomy position. He was catheterized and a rectal tube was inserted. The perineum was shaved, prepped and draped. The ultrasound probe was then introduced into the rectum to see the prostate gland.  TREATMENT DEVICE: A needle grid was attached to the ultrasound probe stand and anchor needles were placed.  3D PLANNING: The prostate was imaged in 3D using a sagittal sweep of the prostate probe. These images were transferred to the planning computer. There, the prostate, urethra and rectum were defined on each axial reconstructed image. Then, the software created an optimized 3D plan and a few seed positions were adjusted. The quality of the plan was reviewed using James Edwards information for the target and the following two organs at risk:  Urethra and Rectum.  Then the accepted plan was printed and handed off to the radiation therapist.  Under my supervision, the custom loading of the seeds and spacers was carried out and loaded into sealed vicryl sleeves.  These pre-loaded needles were then placed into the needle holder.Marland Kitchen  PROSTATE VOLUME STUDY:  Using transrectal ultrasound the volume of the prostate was verified to be 41.8 cc.  SPECIAL TREATMENT PROCEDURE/SUPERVISION AND HANDLING: The pre-loaded needles were then delivered under sagittal guidance. A total of 17 needles were used to deposit  72 seeds in the prostate gland. The individual seed activity was 0.508 mCi.  SpaceOAR:  Yes  COMPLEX SIMULATION: At the end of the procedure, an anterior radiograph of the pelvis was obtained to document seed  positioning and count. Cystoscopy was performed to check the urethra and bladder.  MICRODOSIMETRY: At the end of the procedure, the patient was emitting 0.108 mR/hr at 1 meter. Accordingly, he was considered safe for hospital discharge.  PLAN: The patient will return to the radiation oncology clinic for post implant CT dosimetry in three weeks.   ________________________________  Sheral Apley Tammi Klippel, M.D.

## 2021-04-12 ENCOUNTER — Encounter (HOSPITAL_BASED_OUTPATIENT_CLINIC_OR_DEPARTMENT_OTHER): Payer: Self-pay | Admitting: Urology

## 2021-04-22 DIAGNOSIS — Z85820 Personal history of malignant melanoma of skin: Secondary | ICD-10-CM | POA: Diagnosis not present

## 2021-04-22 DIAGNOSIS — D485 Neoplasm of uncertain behavior of skin: Secondary | ICD-10-CM | POA: Diagnosis not present

## 2021-04-22 DIAGNOSIS — Z85828 Personal history of other malignant neoplasm of skin: Secondary | ICD-10-CM | POA: Diagnosis not present

## 2021-04-30 DIAGNOSIS — R3915 Urgency of urination: Secondary | ICD-10-CM | POA: Diagnosis not present

## 2021-04-30 DIAGNOSIS — C61 Malignant neoplasm of prostate: Secondary | ICD-10-CM | POA: Diagnosis not present

## 2021-04-30 DIAGNOSIS — N401 Enlarged prostate with lower urinary tract symptoms: Secondary | ICD-10-CM | POA: Diagnosis not present

## 2021-05-01 ENCOUNTER — Telehealth: Payer: Self-pay | Admitting: *Deleted

## 2021-05-01 NOTE — Telephone Encounter (Signed)
CALLED PATIENT TO REMIND OF POST SEED APPTS. FOR 05-02-21, SPOKE WITH PATIENT AND HE IS AWARE OF THESE APPTS. ?

## 2021-05-01 NOTE — Progress Notes (Signed)
?  Radiation Oncology         [phone] ?________________________________ ? ?Name: James Edwards MRN: [id number]  ?Date: 05/02/2021  DOB: Sep 13, 1950 ? ?COMPLEX SIMULATION NOTE ? ?NARRATIVE:  The patient was brought to the [facility] today following prostate seed implantation approximately one month ago.  Identity was confirmed.  All relevant records and images related to the planned course of therapy were reviewed.  Then, the patient was set-up supine.  CT images were obtained.  The CT images were loaded into the planning software.  Then the prostate and rectum were contoured.  Treatment planning then occurred.  The implanted iodine 125 seeds were identified by the physics staff for projection of radiation distribution  I have requested : 3D Simulation  I have requested a DVH of the following structures: Prostate and rectum.   ? ?________________________________ ? ?Sheral Apley Tammi Klippel, M.D. ? ?

## 2021-05-02 ENCOUNTER — Other Ambulatory Visit: Payer: Self-pay

## 2021-05-02 ENCOUNTER — Encounter: Payer: Self-pay | Admitting: Adult Health

## 2021-05-02 ENCOUNTER — Encounter: Payer: Self-pay | Admitting: Urology

## 2021-05-02 ENCOUNTER — Ambulatory Visit
Admission: RE | Admit: 2021-05-02 | Discharge: 2021-05-02 | Disposition: A | Discharge status: Home / Self Care | Payer: Medicare Other | Source: Ambulatory Visit | Attending: Urology | Admitting: Urology

## 2021-05-02 ENCOUNTER — Ambulatory Visit
Admission: RE | Admit: 2021-05-02 | Discharge: 2021-05-02 | Disposition: A | Discharge status: Home / Self Care | Payer: Medicare Other | Source: Ambulatory Visit | Attending: Radiation Oncology | Admitting: Radiation Oncology

## 2021-05-02 VITALS — BP 123/73 | HR 70 | Temp 96.9°F | Resp 18 | Ht 70.0 in | Wt 194.8 lb

## 2021-05-02 DIAGNOSIS — C61 Malignant neoplasm of prostate: Secondary | ICD-10-CM | POA: Diagnosis not present

## 2021-05-02 NOTE — Progress Notes (Signed)
Patient here for "Post-Seed" appointment w/ Ashlyn Bruning PA-C. I spoke w/ patient, verified identity, and began nursing interview. Patient states "Doing well." No prostate issues reported to me at this time. ? ?Meaningful use complete. ?I-PSS score of 18 (moderate). ?Flomax 0.'4mg'$  as directed ?Urology appointment F/U- April 2023 ? ?BP 123/73 (BP Location: Left Arm, Patient Position: Sitting, Cuff Size: Normal)   Pulse 70   Temp (!) 96.9 ?F (36.1 ?C) (Temporal)   Resp 18   Ht '5\' 10"'$  (1.778 m)   Wt 194 lb 12.8 oz (88.4 kg)   SpO2 100%   BMI 27.95 kg/m?  ? ?

## 2021-05-13 ENCOUNTER — Telehealth: Payer: Self-pay | Admitting: Adult Health

## 2021-05-13 NOTE — Telephone Encounter (Signed)
Pt walked in and stated that he needed a refill for XARELTO 20 MG sent [place] [address] ASAP. He is currently out. Notes of this meds has been found on date 03/27/20 since this med is not reflecting in their Med list.  ? ?Please advise.  ?

## 2021-05-14 ENCOUNTER — Telehealth: Payer: Self-pay | Admitting: Adult Health

## 2021-05-14 NOTE — Telephone Encounter (Signed)
Pt call and stated he is returning your call and want a call back. 

## 2021-05-14 NOTE — Telephone Encounter (Signed)
Left message to return phone call.

## 2021-05-14 NOTE — Telephone Encounter (Signed)
Pt advised of update and verbalized understanding.  ?

## 2021-05-14 NOTE — Telephone Encounter (Signed)
This has been taking care of.

## 2021-05-15 ENCOUNTER — Telehealth: Payer: Self-pay | Admitting: Cardiology

## 2021-05-15 MED ORDER — RIVAROXABAN 20 MG PO TABS: 20.0000 mg | ORAL_TABLET | Freq: Every day | ORAL | 1 refills | Status: DC

## 2021-05-15 NOTE — Telephone Encounter (Signed)
?*  STAT* If patient is at the pharmacy, call can be transferred to refill team. ? ? ?1. Which medications need to be refilled? (please list name of each medication and dose if known) Xlarelto '20mg'$  ? ?2. Which pharmacy/location (including street and city if local pharmacy) is medication to be sent to? [facility] [address], [place] [address] ? ?3. Do they need a 30 day or 90 day supply? 90 day  ?

## 2021-05-15 NOTE — Telephone Encounter (Signed)
Prescription refill request for Xarelto received.  ?Indication: Afib  ?Last office visit: 01/18/21 Johney Frame) ?Weight: 88.4kg ?Age: 71 ?Scr: 1.06 (04/09/21) ?CrCl: 79.92m/min ? ?Per [place] Pharmacist, discharging MD discontinued Xarelto in error and medication should be refilled. Appropriate dose and refill sent to requested pharmacy.  ?

## 2021-05-23 ENCOUNTER — Telehealth: Payer: Self-pay | Admitting: *Deleted

## 2021-05-23 NOTE — Telephone Encounter (Signed)
Pt left a message on the voicemail stating to call him back in reference to reaching [employer]. Returned a call to the pt and had to leave a message for him to call back. Will await for him to call back. ?

## 2021-05-23 NOTE — Telephone Encounter (Signed)
Called pt and discussed recent refill for Xarelto. Pt wanted to confirm that his prescription was sent to [facility] [phone]. I ensured pt that refill was sent to requested pharmacy on 05/15/21. Pt appreciated call back and information provided.  ?

## 2021-05-31 ENCOUNTER — Encounter: Payer: Self-pay | Admitting: Radiation Oncology

## 2021-05-31 ENCOUNTER — Ambulatory Visit
Admission: RE | Admit: 2021-05-31 | Discharge: 2021-05-31 | Disposition: A | Discharge status: Home / Self Care | Payer: Medicare Other | Source: Ambulatory Visit | Attending: Radiation Oncology | Admitting: Radiation Oncology

## 2021-05-31 DIAGNOSIS — Z191 Hormone sensitive malignancy status: Secondary | ICD-10-CM | POA: Diagnosis not present

## 2021-05-31 DIAGNOSIS — C61 Malignant neoplasm of prostate: Secondary | ICD-10-CM | POA: Diagnosis not present

## 2021-06-02 NOTE — Progress Notes (Signed)
?  Radiation Oncology         ([phone] ?________________________________ ? ?Name: James Edwards MRN: [id number]  ?Date: 05/31/2021  DOB: [birth year] ? ?3D Planning Note  ? ?Prostate Brachytherapy Post-Implant Dosimetry ? ?Diagnosis: 71 y.o. gentleman with stage T1c adenocarcinoma of the prostate with a Gleason's score of 3+4 and a PSA of 4.48 ? ?Narrative: On a previous date, James Edwards returned following prostate seed implantation for post implant planning. He underwent CT scan complex simulation to delineate the three-dimensional structures of the pelvis and demonstrate the radiation distribution.  Since that time, the seed localization, and complex isodose planning with dose volume histograms have now been completed. ? ?Results:   ?Prostate Coverage - The dose of radiation delivered to the 90% or more of the prostate gland (D90) was 116.01% of the prescription dose. This exceeds our goal of greater than 90%. ?Rectal Sparing - The volume of rectal tissue receiving the prescription dose or higher was 0.0 cc. This falls under our thresholds tolerance of 1.0 cc. ? ?Impression: The prostate seed implant appears to show adequate target coverage and appropriate rectal sparing. ? ?Plan:  The patient will continue to follow with urology for ongoing PSA determinations. I would anticipate a high likelihood for local tumor control with minimal risk for rectal morbidity. ? ?________________________________ ? ?Sheral Apley Tammi Klippel, M.D. ? ?

## 2021-06-03 DIAGNOSIS — Z85828 Personal history of other malignant neoplasm of skin: Secondary | ICD-10-CM | POA: Diagnosis not present

## 2021-06-03 DIAGNOSIS — Z85820 Personal history of malignant melanoma of skin: Secondary | ICD-10-CM | POA: Diagnosis not present

## 2021-06-03 DIAGNOSIS — L821 Other seborrheic keratosis: Secondary | ICD-10-CM | POA: Diagnosis not present

## 2021-06-03 DIAGNOSIS — L570 Actinic keratosis: Secondary | ICD-10-CM | POA: Diagnosis not present

## 2021-06-03 DIAGNOSIS — L814 Other melanin hyperpigmentation: Secondary | ICD-10-CM | POA: Diagnosis not present

## 2021-06-03 DIAGNOSIS — D044 Carcinoma in situ of skin of scalp and neck: Secondary | ICD-10-CM | POA: Diagnosis not present

## 2021-06-03 DIAGNOSIS — D485 Neoplasm of uncertain behavior of skin: Secondary | ICD-10-CM | POA: Diagnosis not present

## 2021-06-06 ENCOUNTER — Other Ambulatory Visit: Payer: Self-pay | Admitting: Adult Health

## 2021-06-06 DIAGNOSIS — G4700 Insomnia, unspecified: Secondary | ICD-10-CM

## 2021-06-11 DIAGNOSIS — R3915 Urgency of urination: Secondary | ICD-10-CM | POA: Diagnosis not present

## 2021-06-14 ENCOUNTER — Telehealth: Payer: Self-pay | Admitting: Pharmacist

## 2021-06-14 NOTE — Chronic Care Management (AMB) (Signed)
? ? ?  Chronic Care Management ?Pharmacy Assistant  ? ?Name: James Edwards  MRN: [id number] DOB: Mar 07, 1950 ? ?06/14/21 APPOINTMENT REMINDER ? ? ?Called Patient No answer, left message of appointment on 06/17/21 at 12 via telephone visit with [name], Pharm D.  ? ?Notified to have all medications, supplements, blood pressure and/or blood sugar logs available during appointment and to return call if need to reschedule. ? ? ? ? ?Care Gaps: ?TDAP - Overdue ?Zoster Vaccine - Overdue ?COVID Booster - Overdue ?BP- 123/73 (05/02/21) ?AWV- 9/22 ? ?Star Rating Drug: ?Rosuvastatin (Crestor) 20 mg - Last filled 04/01/21 90 DS at [facility] ?Losartan (Cozaar) 50 mg - Last filled 06/11/21 90 DS at [facility] ? ? ? ?Medications: ?Outpatient Encounter Medications as of 06/14/2021  ?Medication Sig  ? acetaminophen (TYLENOL) 500 MG tablet Take 1,000 mg by mouth every 6 (six) hours as needed for moderate pain.  ? isosorbide mononitrate (IMDUR) 30 MG 24 hr tablet Take 0.5 tablets (15 mg total) by mouth daily.  ? losartan (COZAAR) 50 MG tablet Take 1 tablet (50 mg total) by mouth daily.  ? metoprolol succinate (TOPROL XL) 25 MG 24 hr tablet Take 1 tablet (25 mg total) by mouth daily.  ? metoprolol tartrate (LOPRESSOR) 25 MG tablet Take 1 tablet (25 mg total) by mouth 2 (two) times daily. (Patient taking differently: Take 25 mg by mouth 2 (two) times daily. Per pt take twice daily and as needed for heart rate >100)  ? Multiple Vitamin (MULTIVITAMIN) tablet Take 1 tablet by mouth daily.  ? nitroGLYCERIN (NITROSTAT) 0.4 MG SL tablet Place 1 tablet (0.4 mg total) under the tongue every 5 (five) minutes as needed for chest pain.  ? oxybutynin (DITROPAN-XL) 10 MG 24 hr tablet Take 10 mg by mouth at bedtime.  ? rivaroxaban (XARELTO) 20 MG TABS tablet Take 1 tablet (20 mg total) by mouth daily with supper.  ? rosuvastatin (CRESTOR) 20 MG tablet Take 1 tablet (20 mg total) by mouth daily.  ? tamsulosin (FLOMAX) 0.4 MG CAPS capsule Take 1 capsule (0.4  mg total) by mouth at bedtime.  ? traMADol (ULTRAM) 50 MG tablet Take 1-2 tablets (50-100 mg total) by mouth every 6 (six) hours as needed (pain).  ? traZODone (DESYREL) 50 MG tablet TAKE 1 TABLET BY MOUTH EVERY DAY AT BEDTIME AS NEEDED  ? trolamine salicylate (ASPERCREME) 10 % cream Apply 1 application topically as needed for muscle pain.  ? Turmeric 1053 MG TABS Take 3 tablets by mouth daily.  ? ?No facility-administered encounter medications on file as of 06/14/2021.  ? ? ? ? ?Ned Clines CMA ?Clinical Pharmacist Assistant ?[phone] ? ?

## 2021-06-17 ENCOUNTER — Ambulatory Visit (INDEPENDENT_AMBULATORY_CARE_PROVIDER_SITE_OTHER): Payer: Medicare Other | Admitting: Pharmacist

## 2021-06-17 DIAGNOSIS — E782 Mixed hyperlipidemia: Secondary | ICD-10-CM

## 2021-06-17 DIAGNOSIS — I4819 Other persistent atrial fibrillation: Secondary | ICD-10-CM

## 2021-06-17 DIAGNOSIS — I10 Essential (primary) hypertension: Secondary | ICD-10-CM

## 2021-06-17 NOTE — Progress Notes (Signed)
? ?Chronic Care Management ?Pharmacy Note ? ?06/17/2021 ?Name:  James Edwards MRN:  [id number] DOB:  Nov 16, 1950 ? ?Summary: ?LDL at goal < 70 ?HR is variable and pt takes different doses of metoprolol ? ?Recommendations/Changes made from today's visit: ?-Reached out to cardiology to discuss correct dosing for metoprolol ?-Recommended routine BP and HR monitoring at home ? ?Plan: ?Follow up BP assessment in 2 months ?Follow up in 6 months ? ?Subjective: ?James Edwards is an 71 y.o. year old male who is a primary patient of Nafziger, Tommi Rumps, NP.  The CCM team was consulted for assistance with disease management and care coordination needs.   ? ?Engaged with patient by telephone for follow up visit in response to provider referral for pharmacy case management and/or care coordination services.  ? ?Consent to Services:  ?The patient was given information about Chronic Care Management services, agreed to services, and gave verbal consent prior to initiation of services.  Please see initial visit note for detailed documentation.  ? ?Patient Care Team: ?Dorothyann Peng, NP as PCP - General (Family Medicine) ?Freada Bergeron, MD as PCP - Cardiology (Cardiology) ?Thompson Grayer, MD as PCP - Electrophysiology (Clinical Cardiac Electrophysiology) ?[place], [facility] ?Viona Gilmore, [facility] as Pharmacist (Pharmacist) ? ?Recent office visits: ?02/19/21 Dorothyann Peng, NP - Patient presented for Primary hypertension and other concerns.  ?Stopped Vitamin B-12 and Gin Raisin. ? ?Recent consult visits: ?04/30/21 Raynelle Bring (urology): Patient presented for follow up for BPH and hx of prostate cancer. Unable to access notes. ? ?04/22/21 Jamse Belfast (dermatology): Patient presented for neoplasm of the skin follow up. Unable to access notes. ? ?01/24/21 Freeman Caldron, PA-C (Rad Oncology) - Patient presented for Follow up. No medication changes noted. CT Simulation done ?  ?01/18/21 Freada Bergeron (Cardiology)  - Patient presented for persistent atrial fibrillation and other concerns. Prescribed Isosorbide Mononitrate 15 mg.  ?  ?12/28/20 Clarene Essex, Counselor (Oncology) - Patient presented for Genetic Counseling. No medication changes. ?  ?12/24/20 Deneise Lever, MD (Pulmonology) - Patient presented for OSA and other concerns. No medication changes. ? ?12/11/20 Zola Button, MD (oncology): Patient presented for prostate cancer follow up. Patient will consider radiation and surgery. ? ?Hospital visits: ?Medication Reconciliation was completed by comparing discharge summary, patient?s EMR and Pharmacy list, and upon discussion with patient. ?  ?Patient visited [facility] on 04-11-21 for Radioactive seed implant/brachytherapy implant with cystoscopy. He was there for 5 hours.   ?  ?New?Medications Started at [facility] Discharge:?? ?-started None ?  ?Medication Changes at Hospital Discharge: ?-Changed None ?  ?Medications Discontinued at Hospital Discharge: ?-Stopped None ?  ?Medications that remain the same after Hospital Discharge:??  ?-All other medications will remain the same.   ? ?Objective: ? ?Lab Results  ?Component Value Date  ? CREATININE 1.06 04/09/2021  ? BUN 18 04/09/2021  ? GFR 81.61 02/19/2021  ? GFRNONAA >60 04/09/2021  ? GFRAA >60 07/09/2018  ? NA 135 04/09/2021  ? K 5.1 04/09/2021  ? CALCIUM 10.0 04/09/2021  ? CO2 24 04/09/2021  ? ? ?Lab Results  ?Component Value Date/Time  ? HGBA1C 6.0 02/19/2021 11:33 AM  ? HGBA1C 5.8 05/24/2019 08:19 AM  ? GFR 81.61 02/19/2021 11:33 AM  ? GFR 73.59 03/27/2020 02:37 PM  ?  ?Last diabetic Eye exam: No results found for: HMDIABEYEEXA  ?Last diabetic Foot exam: No results found for: HMDIABFOOTEX  ? ?Lab Results  ?Component Value Date  ? CHOL  129 02/19/2021  ? HDL 58.70 02/19/2021  ? [place] 35 02/19/2021  ? LDLDIRECT 97.0 01/03/2016  ? TRIG 174.0 (H) 02/19/2021  ? CHOLHDL 2 02/19/2021  ? ? ? ?  Latest Ref Rng & Units 04/09/2021  ?  1:28 PM 02/19/2021  ?  11:33 AM 05/24/2019  ?  8:19 AM  ?Hepatic Function  ?Total Protein 6.5 - 8.1 g/dL 6.7   6.8   6.6    ?Albumin 3.5 - 5.0 g/dL 3.9   4.1   4.1    ?AST 15 - 41 U/L 33   33   17    ?ALT 0 - 44 U/L 32   28   14    ?Alk Phosphatase 38 - 126 U/L 30   28   26     ?Total Bilirubin 0.3 - 1.2 mg/dL 0.7   0.6   0.6    ? ? ?Lab Results  ?Component Value Date/Time  ? TSH 0.91 02/19/2021 11:33 AM  ? TSH 1.03 03/27/2020 02:37 PM  ? ? ? ?  Latest Ref Rng & Units 04/09/2021  ?  1:28 PM 02/19/2021  ? 11:33 AM 05/16/2020  ?  8:03 AM  ?CBC  ?WBC 4.0 - 10.5 K/uL 7.3   4.9   7.0    ?Hemoglobin 13.0 - 17.0 g/dL 12.9   13.4   13.5    ?Hematocrit 39.0 - 52.0 % 36.9   40.2   40.3    ?Platelets 150 - 400 K/uL 198   225.0   220    ? ? ?No results found for: VD25OH ? ?Clinical ASCVD: No  ?The ASCVD Risk score (Arnett DK, et al., 2019) failed to calculate for the following reasons: ?  The valid total cholesterol range is 130 to 320 mg/dL   ? ? ?  11/15/2020  ? 10:23 AM 10/26/2019  ?  9:09 AM 05/24/2019  ?  8:19 AM  ?Depression screen PHQ 2/9  ?Decreased Interest 0 0 0  ?Down, Depressed, Hopeless 0 0 0  ?PHQ - 2 Score 0 0 0  ?Altered sleeping  0   ?Tired, decreased energy  0   ?Change in appetite  0   ?Feeling bad or failure about yourself   0   ?Trouble concentrating  0   ?Moving slowly or fidgety/restless  0   ?Suicidal thoughts  0   ?PHQ-9 Score  0   ?Difficult doing work/chores  Not difficult at all   ?  ?CHA2DS2/VAS Stroke Risk Points  Current as of 3 days ago  ?   3 >= 2 Points: High Risk  ?1 - 1.99 Points: Medium Risk  ?0 Points: Low Risk  ?  No Change   ?  ? Details   ? This score determines the patient's risk of having a stroke if the  ?patient has atrial fibrillation.  ?  ?  ? Points Metrics  ?0 Has Congestive Heart Failure:  No   ? Current as of 3 days ago  ?0 Has Vascular Disease:  No   ? Current as of 3 days ago  ?1 Has Hypertension:  Yes   ? Current as of 3 days ago  ?1 Age:  59   ? Current as of 3 days ago  ?1 Has Diabetes:  Yes   ? Current as  of 3 days ago  ?0 Had Stroke:  No  Had TIA:  No  Had Thromboembolism:  No   ? Current as of 3 days  ago  ?0 Male:  No   ? Current as of 3 days ago  ?  ? ? ?Social History  ? ?Tobacco Use  ?Smoking Status Former  ? Packs/day: 2.00  ? Years: 35.00  ? Pack years: 70.00  ? Types: Cigarettes  ? Quit date: 2009  ? Years since quitting: 14.3  ?Smokeless Tobacco Never  ? ?BP Readings from Last 3 Encounters:  ?05/02/21 123/73  ?04/11/21 135/78  ?04/09/21 126/79  ? ?Pulse Readings from Last 3 Encounters:  ?05/02/21 70  ?04/11/21 76  ?04/09/21 94  ? ?Wt Readings from Last 3 Encounters:  ?05/02/21 194 lb 12.8 oz (88.4 kg)  ?04/11/21 187 lb (84.8 kg)  ?04/09/21 190 lb (86.2 kg)  ? ? ?Assessment/Interventions: Review of patient past medical history, allergies, medications, health status, including review of consultants reports, laboratory and other test data, was performed as part of comprehensive evaluation and provision of chronic care management services.  ? ?SDOH:  (Social Determinants of Health) assessments and interventions performed: No ? ? ?CCM Care Plan ? ?Allergies  ?Allergen Reactions  ? Codeine Nausea And Vomiting  ? ? ?Medications Reviewed Today   ? ? Reviewed by Viona Gilmore, [place] (Pharmacist) on 06/17/21 at 1211  Med List Status: <None>  ? ?Medication Order Taking? Sig Documenting Provider Last Dose Status Informant  ?acetaminophen (TYLENOL) 500 MG tablet [id number]  Take 1,000 mg by mouth every 6 (six) hours as needed for moderate pain. [provider]  Active Self  ?isosorbide mononitrate (IMDUR) 30 MG 24 hr tablet [id number] Yes Take 0.5 tablets (15 mg total) by mouth daily. Freada Bergeron, MD Taking Active   ?losartan (COZAAR) 50 MG tablet [id number] Yes Take 1 tablet (50 mg total) by mouth daily. Freada Bergeron, MD Taking Active   ?metoprolol succinate (TOPROL XL) 25 MG 24 hr tablet [id number] Yes Take 1 tablet (25 mg total) by mouth daily. Freada Bergeron, MD Taking Active Self   ?metoprolol tartrate (LOPRESSOR) 25 MG tablet [id number]  Take 1 tablet (25 mg total) by mouth 2 (two) times daily.  ?Patient taking differently: Take 25 mg by mouth 2 (two) times daily. Per pt take twice dai

## 2021-06-17 NOTE — Patient Instructions (Signed)
Hi Ed, ? ?It was great to catch up with you again! I will let you know what I can find out about the metoprolol. ? ?Please reach out to me if you have any questions or need anything before then! ? ?Best, ?Maddie ? ?[name], PharmD, BCACP ?Clinical Pharmacist ?Therapist, music at [place] ?[phone] ? ? Visit Information ? ? Goals Addressed   ?None ?  ? ?Patient Care Plan: [place]  ?  ? ?Problem Identified: Problem: Hypertension, Hyperlipidemia, Diabetes, Atrial Fibrillation and insomnia and history of prostate cancer   ?  ? ?Long-Range Goal: Patient-Specific Goal   ?Start Date: 04/02/2020  ?Expected End Date: 04/02/2021  ?Recent Progress: On track  ?Priority: High  ?Note:   ?Current Barriers:  ?Unable to independently monitor therapeutic efficacy ? ?Pharmacist Clinical Goal(s):  ?Patient will verbalize ability to afford treatment regimen ?achieve adherence to monitoring guidelines and medication adherence to achieve therapeutic efficacy through collaboration with PharmD and provider.  ? ?Interventions: ?1:1 collaboration with Dorothyann Peng, NP regarding development and update of comprehensive plan of care as evidenced by provider attestation and co-signature ?Inter-disciplinary care team collaboration (see longitudinal plan of care) ?Comprehensive medication review performed; medication list updated in electronic medical record ? ?Hypertension (BP goal <140/90) ?-Not ideally controlled ?-Current treatment: ?Metoprolol tartrate 25 mg 1 tablet twice daily as needed - Appropriate, Query effective, Safe, Accessible ?Metoprolol succinate 25 mg 1 tablet every evening - Appropriate, Query effective, Safe, Accessible ?-Medications previously tried: quinapril (switched for Afib) ?-Current home readings: 149/72  ?-Current dietary habits: patient uses some salt when cooking but is conscious of salt content and uses sea salt ?-Current exercise habits: 2 mile walk each morning ?-Denies  hypotensive/hypertensive symptoms ?-Educated on Exercise goal of 150 minutes per week; ?Importance of home blood pressure monitoring; ?-Counseled to monitor BP at home a couple times a week, document, and provide log at future appointments ?-Recommended to continue current medication ? ?Hyperlipidemia: (LDL goal < 70) ?-Controlled ?-Current treatment: ?Rosuvastatin 20 mg 1 tablet daily - Appropriate, Effective, Safe, Accessible ?-Medications previously tried: atorvastatin (fatigue/foggy), pravastatin ?-Current dietary patterns: drinks about 7-10 beers every day and has switched from IPA to light beer ?-Current exercise habits: walking 2 miles a day ?-Educated on Cholesterol goals;  ?Benefits of statin for ASCVD risk reduction; ?Importance of limiting foods high in cholesterol; ?Exercise goal of 150 minutes per week; ?-Recommended to continue current medication ? ?Pre-diabetes (A1c goal <6.5%) ?-Controlled ?-Current medications: ?No medications ?-Medications previously tried: none  ?-Current home glucose readings ?fasting glucose: does not need to check ?post prandial glucose: does not need to check ?-Denies hypoglycemic/hyperglycemic symptoms ?-Current meal patterns:  ?breakfast: n/a  ?lunch: n/a  ?dinner: n/a ?snacks: n/a ?drinks: several beers per day ?-Current exercise: walking daily ?-Educated on A1c and blood sugar goals; ?Exercise goal of 150 minutes per week; ?Carbohydrate counting and/or plate method ?-Counseled to check feet daily and get yearly eye exams ?-Counseled on diet and exercise extensively ? ?Atrial Fibrillation (Goal: prevent stroke and major bleeding) ?-Controlled ?-CHADSVASC: 3 ?-Current treatment: ?Rate control: metoprolol tartrate 25 mg 1 tablet three times daily as needed; metoprolol succinate 25 mg 1 tablet every evening - Appropriate, Effective, Safe, Query accessible ?Anticoagulation: Xarelto 20 mg 1 tablet with dinner - Appropriate, Effective, Safe, Accessible ?-Medications previously  tried: none ?-Home BP and HR readings: unable to provide  (not checking consistently) - denies HR > 100 with smart watch readings ?-Counseled on bleeding risk associated with Xarelto and importance of  self-monitoring for signs/symptoms of bleeding; ?avoidance of NSAIDs due to increased bleeding risk with anticoagulants; ?-Recommended to continue current medication ?Assessed patient finances. Qualifies for [employer]. ?Reached out to cardiology about directions for metoprolol tartrate. ? ?Insomnia (Goal: improve quality and quantity of sleep) ?-Controlled ?-Current treatment  ?Trazodone 50 mg 1 tablet at bedtime as needed - Appropriate, Effective, Safe, Accessible ?-Medications previously tried: none  ?-Recommended to continue current medication ?Counseled on practicing good sleep hygiene by setting a sleep schedule and maintaining it, avoid excessive napping, following a nightly routine, avoiding screen time for 30-60 minutes before going to bed, and making the bedroom a cool, quiet and dark space  ? ?History of prostate cancer (Goal: minimize symptoms of urinary urgency/frequency) ?-Uncontrolled ?-Current treatment  ?Oxybutynin ER 10 mg 1 tablet every evening - Appropriate, Effective, Safe, Accessible ?Tamsulosin 0.4 mg 1 capsule daily - Appropriate, Effective, Safe, Accessible ?-Medications previously tried: none  ?-Recommended to discuss with urologist about switching Oxybutynin to more effective alternative ? ?Health Maintenance ?-Vaccine gaps: shingles, tetanus ?-Current therapy:  ?Multivitamin 1 tablet daily ?Gin raisin 8-9 raisins (anti-inflammatory for thumbs, knees) ?Turmeric 400 mg 1 tablet daily (feet pain) ?-Educated on Cost vs benefit of each product must be carefully weighed by individual consumer ?-Patient is satisfied with current therapy and denies issues ?-Recommended to continue current medication ? ?Patient Goals/Self-Care Activities ?Patient will:  ?- take medications as prescribed ?check  blood pressure a few times a week, document, and provide at future appointments ? ?Follow Up Plan: The care management team will reach out to the patient again over the next 7 days.   ? ?  ?  ? ?Patient verbalizes understanding of instructions and care plan provided today and agrees to view in [place]. Active MyChart status confirmed with patient.   ?The pharmacy team will reach out to the patient again over the next 7 days.  ? ?Viona Gilmore, RPH  ?

## 2021-07-05 ENCOUNTER — Other Ambulatory Visit: Payer: Self-pay

## 2021-07-05 MED ORDER — METOPROLOL SUCCINATE ER 25 MG PO TB24: 25.0000 mg | ORAL_TABLET | Freq: Every day | ORAL | 0 refills | Status: DC

## 2021-07-17 DIAGNOSIS — Z87891 Personal history of nicotine dependence: Secondary | ICD-10-CM

## 2021-07-17 DIAGNOSIS — I4891 Unspecified atrial fibrillation: Secondary | ICD-10-CM | POA: Diagnosis not present

## 2021-07-17 DIAGNOSIS — I10 Essential (primary) hypertension: Secondary | ICD-10-CM | POA: Diagnosis not present

## 2021-07-17 DIAGNOSIS — E785 Hyperlipidemia, unspecified: Secondary | ICD-10-CM | POA: Diagnosis not present

## 2021-07-17 DIAGNOSIS — Z8546 Personal history of malignant neoplasm of prostate: Secondary | ICD-10-CM | POA: Diagnosis not present

## 2021-07-17 NOTE — Progress Notes (Unsigned)
Cardiology Office Note:    Date:  07/24/2021   ID:  [name], DOB 1951/01/06, MRN [id number]  PCP:  Dorothyann Peng, NP   [facility]  Cardiologist:  Freada Bergeron, MD  Advanced Practice Provider:  No care team member to display Electrophysiologist:  Thompson Grayer, MD   Referring MD: Dorothyann Peng, NP    History of Present Illness:    James Edwards is a 71 y.o. male with a hx of  DMII, HTN, persistent Afib, prostate cancer and GERD who presents to clinic for CV follow-up.  The patient was initially diagnosed with atrial fibrillation 03/27/20 after presenting to his PCP office. Patient reports his Apple Watch alerted him that he was in afib on 01/30/20 but did not have any other alerts until 03/2020. ECG at his PCP office confirmed afib. He was started on metoprolol for rate control and Xarelto for a CHADS2VASC score of 2. TTE was obtained which showed LVEF 55% with inferior basal hypokinesis, moderate LAE, mild/mod aortic sclerosis without stenosis.  Was seen in clinic on 04/16/20 where he was doing well. We obtained CTA on 04/23/20 for inferobasal hypokinesis noted on TTE, which showed mild LAD disease with Ca score 225 (58% for age-sex matched control).  FFR with normal prox and mid. Distal 0.78 but was smaller caliber. Was recommended for medical management.  Saw Dr. Rayann Heman in clinic on 05/02/20 where he was recommended for rate control of his Afib due to severe LA enlargement. He then presented to the ER in 05/2020 with chest pain. He underwent LHC which revealed mild, nonobstructive disease.  During visit on 07/19/20, he was having intermittent fatigue with his Afib. No significant chest pain or HF symptoms. We started metop XL with prn tartrate for breakthrough symptoms.  Was last seen in clinic on 01/2021 where he continued to have intermittent fatigue and chest pain. He was in persistent Afib according to his apple watch but rates were  controlled. We started imdur to see if that helped relieve some of the chest pain.  Today, the patient overall feels about the same. Continues to feel fatigued which is unchanged. Heart rates are controlled in the 70-80s. No significant palpitations. Tolerating xarelto without issues. No SOB, orthopnea or PND. He is interested in trying dilt instead of metop to see if fatigue improves.   Past Medical History:  Diagnosis Date   BPH without obstruction/lower urinary tract symptoms    Coronary artery disease    cardiologist--- dr Johney Frame;  cath 05-18-2020  mininmal nonobstructive disease   ED (erectile dysfunction)    GERD (gastroesophageal reflux disease)    no meds   History of 2019 novel coronavirus disease (COVID-19) 2020   per pt asymptomatic   History of melanoma excision 06/2020   per pt localized area mid back   Hyperlipidemia, mixed    Hypertension    followed by pcp   Mild obstructive sleep apnea    followed by dr young;   study in epic 09-25-2020,  AHI 8.8/hr,  no cpap   OA (osteoarthritis)    Persistent atrial fibrillation ([place]) 03/2020   cardiologist-- dr h. Johney Frame---  on xarelto and bb;  work-up includes TTE, CTA, event monitor, and cardiac cath (results in epic),  pt ef 55-656 per cath and echo 55%   Pre-diabetes    Prostate cancer ([place]) 08/2009   urologist--- dr Alinda Money--- first dx 07/ 2011,  Gleason 3+3,  active surveillance since;  last  bx 06/ 2022  Gleason 7 scheduled for brachtherapy   Wears glasses     Past Surgical History:  Procedure Laterality Date   COLONOSCOPY     last one 2019 approx   ELBOW SURGERY Right 2007   LEFT HEART CATH AND CORONARY ANGIOGRAPHY N/A 05/18/2020   Procedure: LEFT HEART CATH AND CORONARY ANGIOGRAPHY;  Surgeon: Martinique, Peter M, MD;  Location: [facility];  Service: Cardiovascular;  Laterality: N/A;   PROSTATE BIOPSY N/A 01/21/2016   Procedure: BIOPSY TRANSRECTAL ULTRASONIC PROSTATE (TUBP);  Surgeon: Raynelle Bring, MD;   Location: [facility];  Service: Urology;  Laterality: N/A;   PROSTATE BIOPSY N/A 07/15/2018   Procedure: NEEDLE BIOPSY TRANSRECTAL ULTRASONIC PROSTATE (TUBP);  Surgeon: Raynelle Bring, MD;  Location: [facility];  Service: Urology;  Laterality: N/A;   PROSTATE BIOPSY N/A 08/13/2020   Procedure: BIOPSY TRANSRECTAL ULTRASONIC PROSTATE (TUBP);  Surgeon: Raynelle Bring, MD;  Location: [facility];  Service: Urology;  Laterality: N/A;   RADIOACTIVE SEED IMPLANT N/A 04/11/2021   Procedure: RADIOACTIVE SEED IMPLANT/BRACHYTHERAPY IMPLANT WITH CYSTOSCOPY;  Surgeon: Raynelle Bring, MD;  Location: [facility];  Service: Urology;  Laterality: N/A;   SPACE OAR INSTILLATION N/A 04/11/2021   Procedure: SPACE OAR INSTILLATION;  Surgeon: Raynelle Bring, MD;  Location: [facility];  Service: Urology;  Laterality: N/A;    Current Medications: Current Meds  Medication Sig   acetaminophen (TYLENOL) 500 MG tablet Take 1,000 mg by mouth every 6 (six) hours as needed for moderate pain.   diltiazem (CARDIZEM CD) 120 MG 24 hr capsule Take 1 capsule (120 mg total) by mouth daily.   diltiazem (CARDIZEM) 30 MG tablet Take 1 tablet (30 mg total) by mouth every 6 (six) hours as needed (breakthrough palpitations).   isosorbide mononitrate (IMDUR) 30 MG 24 hr tablet Take 0.5 tablets (15 mg total) by mouth daily.   Multiple Vitamin (MULTIVITAMIN) tablet Take 1 tablet by mouth daily.   nitroGLYCERIN (NITROSTAT) 0.4 MG SL tablet Place 1 tablet (0.4 mg total) under the tongue every 5 (five) minutes as needed for chest pain.   oxybutynin (DITROPAN-XL) 10 MG 24 hr tablet Take 10 mg by mouth at bedtime.   rivaroxaban (XARELTO) 20 MG TABS tablet Take 1 tablet (20 mg total) by mouth daily with supper.   rosuvastatin (CRESTOR) 20 MG tablet Take 1 tablet (20 mg total) by mouth daily.   tamsulosin (FLOMAX) 0.4 MG CAPS capsule Take 1 capsule (0.4 mg total) by mouth at bedtime.   traZODone (DESYREL) 50 MG  tablet TAKE 1 TABLET BY MOUTH EVERY DAY AT BEDTIME AS NEEDED   trolamine salicylate (ASPERCREME) 10 % cream Apply 1 application topically as needed for muscle pain.   Turmeric 1053 MG TABS Take 3 tablets by mouth daily.   [DISCONTINUED] metoprolol succinate (TOPROL XL) 25 MG 24 hr tablet Take 1 tablet (25 mg total) by mouth daily.   [DISCONTINUED] metoprolol tartrate (LOPRESSOR) 25 MG tablet Take 1 tablet (25 mg total) by mouth 2 (two) times daily.     Allergies:   Codeine   Social History   Socioeconomic History   Marital status: Married    Spouse name: Not on file   Number of children: 1   Years of education: Not on file   Highest education level: Not on file  Occupational History   Not on file  Tobacco Use   Smoking status: Former    Packs/day: 2.00    Years: 35.00  Pack years: 70.00    Types: Cigarettes    Quit date: 2009    Years since quitting: 14.4   Smokeless tobacco: Never  Vaping Use   Vaping Use: Never used  Substance and Sexual Activity   Alcohol use: Yes    Alcohol/week: 42.0 standard drinks    Types: 42 Cans of beer per week    Comment: 6pk beer per day  (12 oz each)   Drug use: Never   Sexual activity: Not on file  Other Topics Concern   Not on file  Social History Narrative   Lives in [place] with wife.   Retired.  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    Two Children who both live locally.       He likes to sleep.    Social Determinants of Health   Financial Resource Strain: Low Risk    Difficulty of Paying Living Expenses: Not hard at all  Food Insecurity: No Food Insecurity   Worried About Charity fundraiser in the Last Year: Never true   [place] in the Last Year: Never true  Transportation Needs: No Transportation Needs   Lack of Transportation (Medical): No   Lack of Transportation (Non-Medical): No  Physical Activity: Sufficiently Active   Days of Exercise per Week: 7 days    Minutes of Exercise per Session: 50 min  Stress: No Stress Concern Present   Feeling of Stress : Not at all  Social Connections: Moderately Isolated   Frequency of Communication with Friends and Family: Three times a week   Frequency of Social Gatherings with Friends and Family: Three times a week   Attends Religious Services: Never   Active Member of Clubs or Organizations: No   Attends Archivist Meetings: Never   Marital Status: Married     Family History: The patient's family history includes Arthritis in his father; Bladder Cancer (age of onset: 35) in his father; Colon polyps in his father; Dementia in his brother; Glaucoma in his mother; Heart disease in his father and mother; Hypertension in his father and mother; Lymphoma in his cousin; Prostate cancer in his cousin. There is no history of Colon cancer, Esophageal cancer, Rectal cancer, Stomach cancer, or Breast cancer.  ROS:   Please see the history of present illness.    Review of Systems  Constitutional:  Positive for malaise/fatigue. Negative for chills and fever.  Respiratory:  Negative for shortness of breath.   Cardiovascular:  Negative for chest pain, palpitations, orthopnea, claudication, leg swelling and PND.  Gastrointestinal:  Negative for blood in stool, melena and nausea.  Genitourinary:  Negative for hematuria.  Musculoskeletal:  Positive for joint pain.  Neurological:  Negative for dizziness and loss of consciousness.  Endo/Heme/Allergies:  Negative for polydipsia.  Psychiatric/Behavioral:  Negative for substance abuse.    EKGs/Labs/Other Studies Reviewed:    The following studies were reviewed today: LHC 06-07-2020:  Prox LAD lesion is 20% stenosed. Dist Cx lesion is 30% stenosed. The left ventricular systolic function is normal. LV end diastolic pressure is normal. The left ventricular ejection fraction is 55-65% by visual estimate.   1. Minor nonobstructive CAD 2. Normal LV function 3. Normal  LVEDP   Plan: risk factor modification.    Coronary CTA 04/23/20: FINDINGS: A 140 kV prospective scan was triggered in the descending thoracic aorta at 111 HU's. Axial non-contrast 3 mm slices were carried out through  the heart. The data set was analyzed on a dedicated work station and scored using the [place]. Gantry rotation speed was 250 msecs and collimation was .6 mm. Beta blockade and 0.8 mg of sl NTG was given. The 3D data set was reconstructed in 5% intervals of the 67-82 % of the R-R cycle. Diastolic phases were analyzed on a dedicated work station using MPR, MIP and VRT modes. The patient received 80 cc of contrast.   Aorta:  Normal size.  Aortic atherosclerosis.  No dissection.   Aortic Valve:  Trileaflet.  No calcifications.   Coronary Arteries:  Normal coronary origin.  Right dominance.   RCA is a large dominant artery that gives rise to PDA and PLA. There is no plaque.   Left main is a large artery that gives rise to LAD and LCX arteries.   LAD is a large vessel that has proximal and mid plaque (at diagonal bifurcation), 25-49% stenosis. Will check FFR for validation.   LCX is a non-dominant artery that gives rise to one large OM1 branch. There is mild focal calcified plaque, 0-24% stenosis in the mid AV groove segment.   Other findings:   Normal pulmonary vein drainage into the left atrium.   Normal left atrial appendage without a thrombus.   Normal size of the pulmonary artery.   Please see radiology report for non cardiac findings.   IMPRESSION: 1. Coronary calcium score of 224. This was 73 percentile for age and sex matched control.   2. Normal coronary origin with right dominance.   3. Proximal LAD and mid LAD calcified plaque of 25-49%. Sending for FFR for validation. Small focal circumflex calcified plaque 0-24%.  FINDINGS: FFRct analysis was performed on the original cardiac CT angiogram dataset. Diagrammatic representation of the  FFRct analysis is provided in a separate PDF document in PACS. This dictation was created using the PDF document and an interactive 3D model of the results. 3D model is not available in the EMR/PACS. Normal FFR range is >0.80.   1. Left Main: Normal   2. LAD: Normal prox and mid. Distal 0.78 however this is smaller caliber vessel.   3. LCX: Normal   4. Ramus: N/A   5. RCA: Normal  Cardiac Monitor 05/08/20: Patch wear time was 6 days and 10 hours. There was 100% burden of atrial fibrillation with HR ranging from 56-172 bpm (average of 95bpm) There was 1 run of VT lasting 6 beats at HR 207bpm Rare PVCs     Patch Wear Time:  6 days and 10 hours (2022-03-08T19:46:41-0500 to 2022-03-15T07:40:13-0400)   1 run of Ventricular Tachycardia occurred lasting 6 beats with a max rate of 207 bpm (avg 192 bpm). Atrial Fibrillation occurred continuously (100% burden), ranging from 56-172 bpm (avg of 95 bpm). Isolated VEs were rare (<1.0%, 3688), VE Couplets were  rare (<1.0%, 215), and VE Triplets were rare (<1.0%, 4). Ventricular Trigeminy was present.    TTE 04/15/20: IMPRESSIONS   1. Inferior basal hypokinesis . Left ventricular ejection fraction, by  estimation, is 55%. The left ventricle has normal function. The left  ventricle has no regional wall motion abnormalities. Left ventricular  diastolic parameters are indeterminate.   2. Right ventricular systolic function is normal. The right ventricular  size is normal. There is normal pulmonary artery systolic pressure.   3. Left atrial size was moderately dilated.   4. The mitral valve is abnormal. Trivial mitral valve regurgitation. No  evidence of mitral stenosis.   5.  The aortic valve is tricuspid. There is moderate calcification of the  aortic valve. Aortic valve regurgitation is not visualized. Mild to  moderate aortic valve sclerosis/calcification is present, without any  evidence of aortic stenosis.   6. The inferior vena cava is  normal in size with greater than 50%  respiratory variability, suggesting right atrial pressure of 3 mmHg.   Abdominal aortogram 03/2018:     Abdominal Aorta Findings:  +-----------+-------+----------+----------+--------+--------+--------+  Location   AP (cm)Trans (cm)PSV (cm/s)WaveformThrombusComments  +-----------+-------+----------+----------+--------+--------+--------+  Proximal   1.75   1.72      102                                 +-----------+-------+----------+----------+--------+--------+--------+  Mid        2.22   2.00      113                                 +-----------+-------+----------+----------+--------+--------+--------+  Distal     1.71   1.75      122                                 +-----------+-------+----------+----------+--------+--------+--------+  RT CIA Prox0.9    0.9       407                                 +-----------+-------+----------+----------+--------+--------+--------+  LT CIA Prox0.9    1.1       451                                 +-----------+-------+----------+----------+--------+--------+--------+       Summary:  Abdominal Aorta: No evidence of an abdominal aortic aneurysm was  visualized. The largest aortic measurement is 2.2 cm.  Stenosis: +------------------+-------------+  Location          Stenosis       +------------------+-------------+  Right Common Iliac>50% stenosis  +------------------+-------------+  Left Common Iliac >50% stenosis  +------------------+-------------+    Recent Labs: 02/19/2021: TSH 0.91 04/09/2021: ALT 32; BUN 18; Creatinine, Ser 1.06; Hemoglobin 12.9; Platelets 198; Potassium 5.1; Sodium 135  Recent Lipid Panel    Component Value Date/Time   CHOL 129 02/19/2021 1133   TRIG 174.0 (H) 02/19/2021 1133   HDL 58.70 02/19/2021 1133   CHOLHDL 2 02/19/2021 1133   VLDL 34.8 02/19/2021 1133   LDLCALC 35 02/19/2021 1133   LDLDIRECT 97.0 01/03/2016 0821    ECG: 01/18/21: Afib with HR 78  Risk Assessment/Calculations:    CHA2DS2-VASc Score = 3  This indicates a 3.2% annual risk of stroke. The patient's score is based upon: CHF History: 0 HTN History: 1 Diabetes History: 0 Stroke History: 0 Vascular Disease History: 1 Age Score: 1 Gender Score: 0     Physical Exam:    VS:  BP 132/72   Pulse 71   Ht '5\' 10"'$  (1.778 m)   Wt 194 lb 6.4 oz (88.2 kg)   SpO2 98%   BMI 27.89 kg/m     Wt Readings from Last 3 Encounters:  07/24/21 194 lb 6.4 oz (88.2 kg)  05/02/21 194 lb 12.8 oz (88.4 kg)  04/11/21 187 lb (84.8  kg)     GEN:  Comfortable, NAD HEENT: Normal NECK: No JVD; No carotid bruits CARDIAC: Irregularly irregular, no murmurs, rubs, gallops RESPIRATORY:  CTAB ABDOMEN: Soft, non-tender, non-distended MUSCULOSKELETAL:  No edema; No deformity  SKIN: Warm and dry NEUROLOGIC:  Alert and oriented x 3 PSYCHIATRIC:  Normal affect   ASSESSMENT:    1. Persistent atrial fibrillation ([place])   2. Essential hypertension   3. Mild coronary artery disease   4. Secondary hypercoagulable state ([place])   5. Mixed hyperlipidemia     PLAN:    In order of problems listed above:  #Mild Nonobstructive CAD: Patient with 20% prox LAD and 30% distal LCx stenosis on [facility] 05/18/20. On medical management.  -No ASA as patient on xarelto -Continue crestor '20mg'$  daily -Continue losartan '50mg'$  daily -Continue imdur '15mg'$  daily  -Will trial off the metop due to fatigue and switch to diltiazem  #Persistent Afib: CHADs-vasc 3. Followed by Afib clinic. Unfortunately, not a great candidate for rhythm control due to severe LAE.  -Continue xarelto '20mg'$  daily -Change metop to diltiazem '120mg'$  daily -Change to dilt '30mg'$  TID prn for HR>100  #HTN: Well controlled and at goal <120/80s. -Change metop to diltiazem '120mg'$  daily as above -Continue losartan '50mg'$  daily  #HLD: -Continue Crestor '20mg'$  daily -LDL 35 02/2021  #Prostate Cancer: Followed by  Alinda Money. Gleason 6 adenocarcinoma.  -S/p radiation seed implant  -Follow up with Dr. Alinda Money as scheduled    Medication Adjustments/Labs and Tests Ordered: Current medicines are reviewed at length with the patient today.  Concerns regarding medicines are outlined above.  No orders of the defined types were placed in this encounter.   Meds ordered this encounter  Medications   diltiazem (CARDIZEM CD) 120 MG 24 hr capsule    Sig: Take 1 capsule (120 mg total) by mouth daily.    Dispense:  90 capsule    Refill:  3    This will be scheduled med.   diltiazem (CARDIZEM) 30 MG tablet    Sig: Take 1 tablet (30 mg total) by mouth every 6 (six) hours as needed (breakthrough palpitations).    Dispense:  120 tablet    Refill:  3    As needed medication     Patient Instructions  Medication Instructions:   STOP TAKING METOPROLOL SUCCINATE (TOPROL XL) NOW  STOP TAKING METOPROLOL TARTRATE (LOPRESSOR) NOW  START TAKING DILTIAZEM (CARDIZEM CD) 120 MG BY MOUTH DAILY  START TAKING DILTIAZEM 30 MG BY MOUTH EVERY 6 HOURS AS NEEDED FOR BREAKTHROUGH PALPITATIONS  *If you need a refill on your cardiac medications before your next appointment, please call your pharmacy*   Follow-Up: At [facility], you and your health needs are our priority.  As part of our continuing mission to provide you with exceptional heart care, we have created designated Provider Care Teams.  These Care Teams include your primary Cardiologist (physician) and Advanced Practice Providers (APPs -  Physician Assistants and Nurse Practitioners) who all work together to provide you with the care you need, when you need it.  We recommend signing up for the patient portal called "MyChart".  Sign up information is provided on this After Visit Summary.  MyChart is used to connect with patients for Virtual Visits (Telemedicine).  Patients are able to view lab/test results, encounter notes, upcoming appointments, etc.  Non-urgent  messages can be sent to your provider as well.   To learn more about what you can do with MyChart, go to [email or web address].  Your next appointment:   6 month(s)  The format for your next appointment:   In Person  Provider:   Freada Bergeron, MD {    Important Information About Sugar          Signed, Freada Bergeron, MD  07/24/2021 9:45 AM    [place]

## 2021-07-24 ENCOUNTER — Ambulatory Visit (INDEPENDENT_AMBULATORY_CARE_PROVIDER_SITE_OTHER): Payer: Medicare Other | Admitting: Cardiology

## 2021-07-24 ENCOUNTER — Encounter: Payer: Self-pay | Admitting: Cardiology

## 2021-07-24 VITALS — BP 132/72 | HR 71 | Ht 70.0 in | Wt 194.4 lb

## 2021-07-24 DIAGNOSIS — I10 Essential (primary) hypertension: Secondary | ICD-10-CM | POA: Diagnosis not present

## 2021-07-24 DIAGNOSIS — I2510 Atherosclerotic heart disease of native coronary artery without angina pectoris: Secondary | ICD-10-CM

## 2021-07-24 DIAGNOSIS — E782 Mixed hyperlipidemia: Secondary | ICD-10-CM | POA: Diagnosis not present

## 2021-07-24 DIAGNOSIS — I4819 Other persistent atrial fibrillation: Secondary | ICD-10-CM | POA: Diagnosis not present

## 2021-07-24 DIAGNOSIS — D6869 Other thrombophilia: Secondary | ICD-10-CM | POA: Diagnosis not present

## 2021-07-24 MED ORDER — DILTIAZEM HCL ER COATED BEADS 120 MG PO CP24: 120.0000 mg | ORAL_CAPSULE | Freq: Every day | ORAL | 3 refills | Status: DC

## 2021-07-24 MED ORDER — DILTIAZEM HCL 30 MG PO TABS: 30.0000 mg | ORAL_TABLET | Freq: Four times a day (QID) | ORAL | 3 refills | Status: DC | PRN

## 2021-07-24 NOTE — Patient Instructions (Signed)
Medication Instructions:   STOP TAKING METOPROLOL SUCCINATE (TOPROL XL) NOW  STOP TAKING METOPROLOL TARTRATE (LOPRESSOR) NOW  START TAKING DILTIAZEM (CARDIZEM CD) 120 MG BY MOUTH DAILY  START TAKING DILTIAZEM 30 MG BY MOUTH EVERY 6 HOURS AS NEEDED FOR BREAKTHROUGH PALPITATIONS  *If you need a refill on your cardiac medications before your next appointment, please call your pharmacy*   Follow-Up: At [facility], you and your health needs are our priority.  As part of our continuing mission to provide you with exceptional heart care, we have created designated Provider Care Teams.  These Care Teams include your primary Cardiologist (physician) and Advanced Practice Providers (APPs -  Physician Assistants and Nurse Practitioners) who all work together to provide you with the care you need, when you need it.  We recommend signing up for the patient portal called "MyChart".  Sign up information is provided on this After Visit Summary.  MyChart is used to connect with patients for Virtual Visits (Telemedicine).  Patients are able to view lab/test results, encounter notes, upcoming appointments, etc.  Non-urgent messages can be sent to your provider as well.   To learn more about what you can do with MyChart, go to [email or web address].    Your next appointment:   6 month(s)  The format for your next appointment:   In Person  Provider:   Freada Bergeron, MD {    Important Information About Sugar

## 2021-09-23 IMAGING — CT CT HEART MORP W/ CTA COR W/ SCORE W/ CA W/CM &/OR W/O CM
1 series · 4 of 6 positions shown, 5 images · non-contrast
Comparison: None.
COMPARISON: None.

Addendum:
EXAM:
OVER-READ INTERPRETATION  CT CHEST

The following report is an over-read performed by radiologist Dr.
Oukaci Zinab [REDACTED] on 04/23/2020. This over-read
does not include interpretation of cardiac or coronary anatomy or
pathology. The coronary CTA interpretation by the cardiologist is
attached.
CLINICAL DATA: 69 year old with abnormal echocardiogram
Cardiac/Coronary  CTA
TECHNIQUE: The patient was scanned on a [employer] Force scanner.

[Series 1044: — · 0.39mm/px · 4 of 6 slices shown, 5 images]
[im 2/6  vessel]
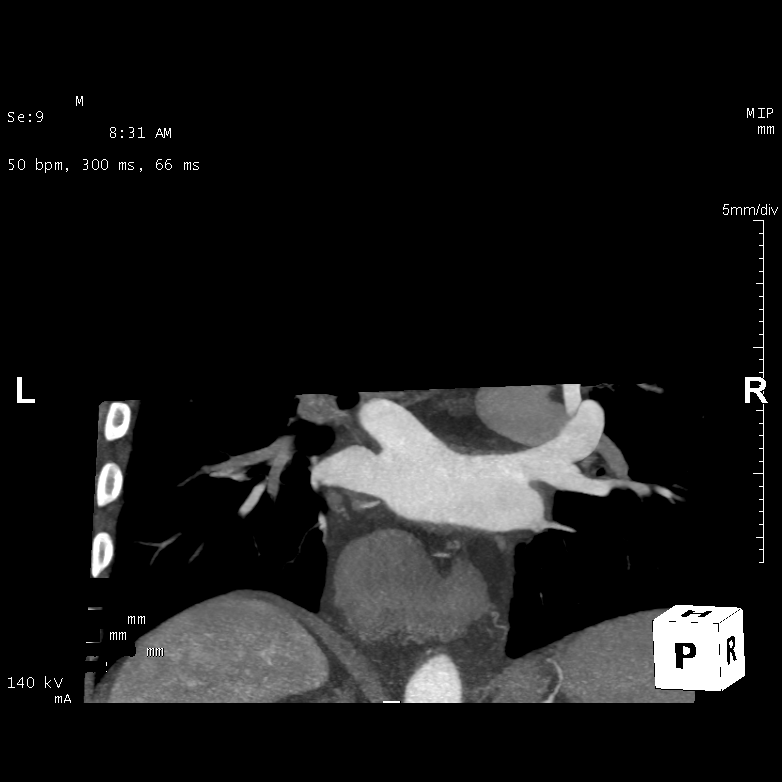
[im 2/6  lung]
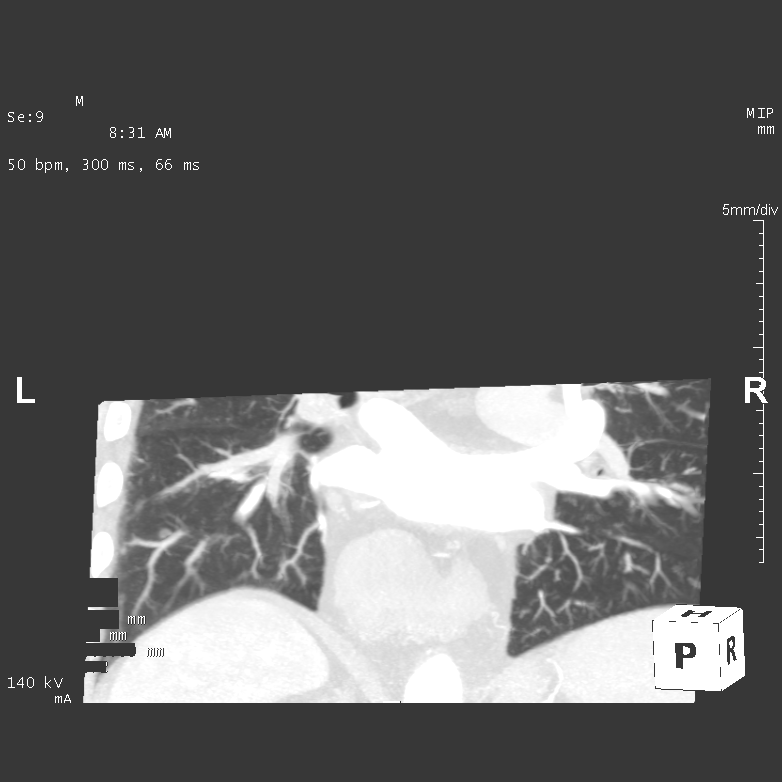
[im 3/6  vessel]
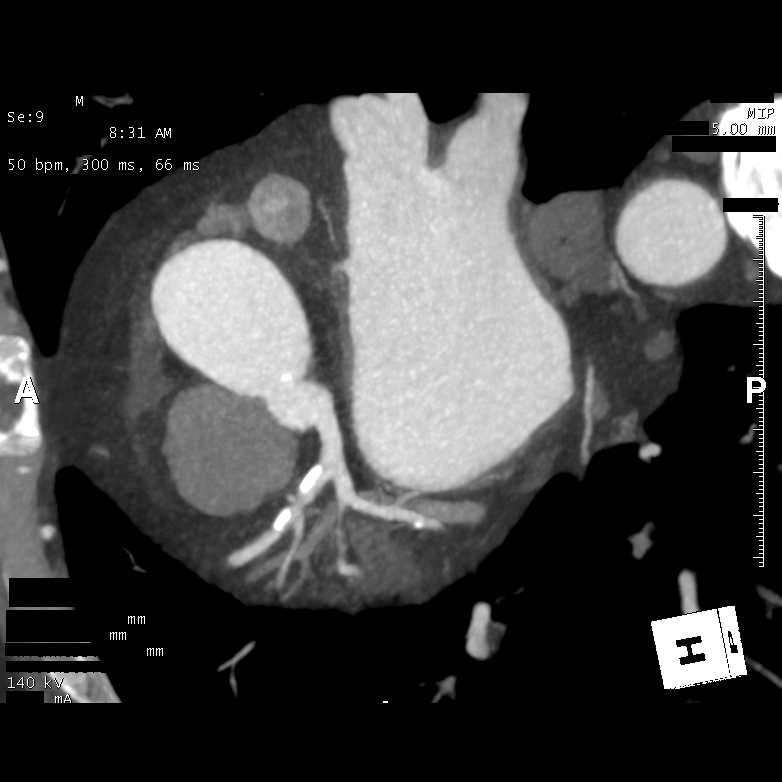
[im 4/6  vessel]
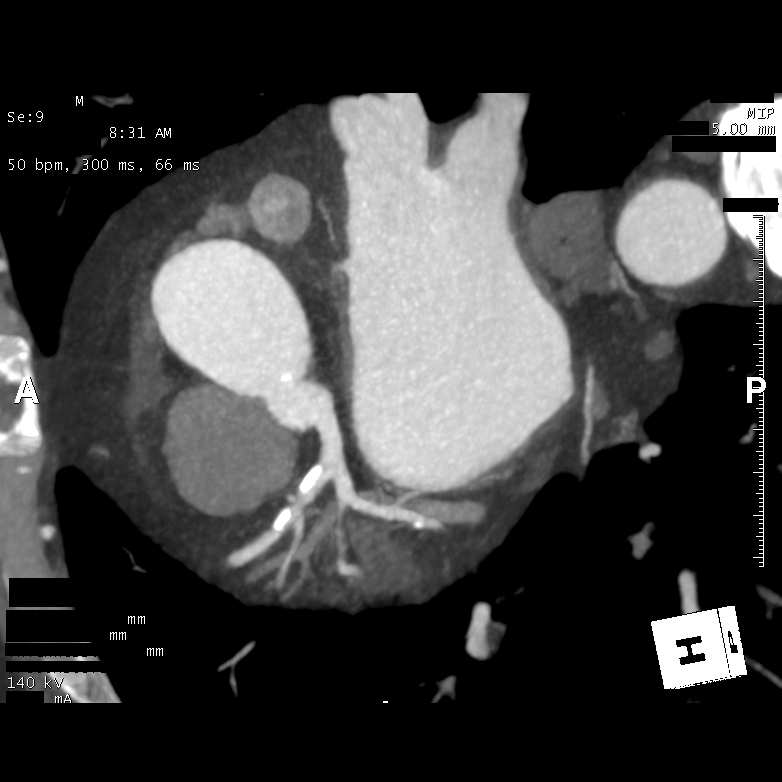
[im 5/6  vessel]
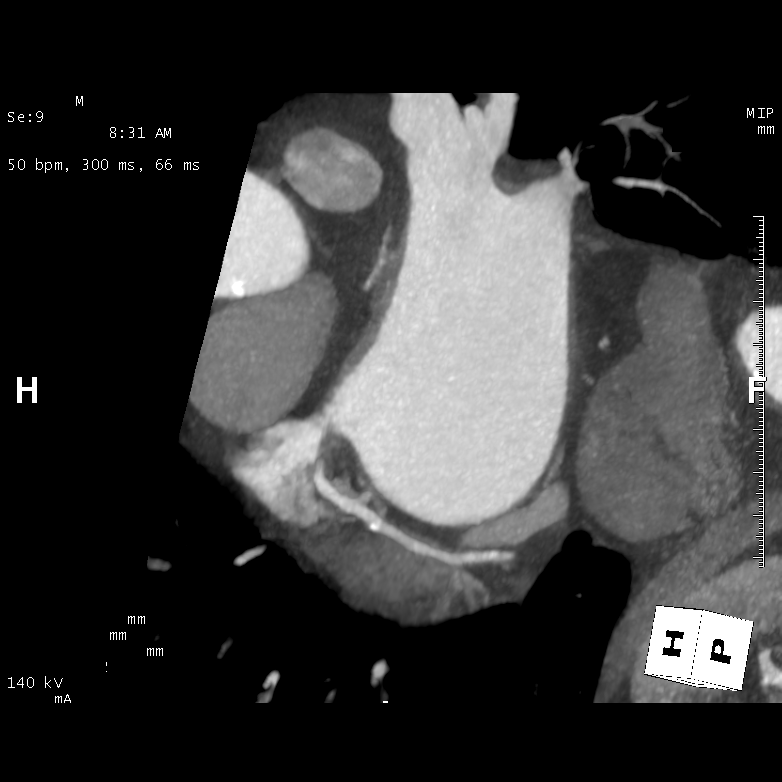

[4 of 6 positions shown; findings below may reference images not displayed]

FINDINGS: Vascular: Aortic atherosclerosis.

Mediastinum/Nodes: No imaged thoracic adenopathy.

Small to moderate hiatal hernia.

Lungs/Pleura: No pleural fluid. Mild centrilobular emphysema. Clear
imaged lungs.

Upper Abdomen: Normal imaged portions of the liver, spleen.

Musculoskeletal: Moderate thoracic spondylosis.
IMPRESSION: 1. No acute findings in the imaged extracardiac chest.
2. Small to moderate hiatal hernia.
3. Aortic Atherosclerosis (AN7F0-8D9.9) and Emphysema (AN7F0-PRQ.J).
FINDINGS: A 140 kV prospective scan was triggered in the descending thoracic
aorta at 111 HU's. Axial non-contrast 3 mm slices were carried out
through the heart. The data set was analyzed on a dedicated work
station and scored using the Agatson method. Gantry rotation speed
was 250 msecs and collimation was .6 mm. Beta blockade and 0.8 mg of
sl NTG was given. The 3D data set was reconstructed in 5% intervals
of the 67-82 % of the R-R cycle. Diastolic phases were analyzed on a
dedicated work station using MPR, MIP and VRT modes. The patient
received 80 cc of contrast.

Aorta:  Normal size.  Aortic atherosclerosis.  No dissection.

Aortic Valve:  Trileaflet.  No calcifications.

Coronary Arteries:  Normal coronary origin.  Right dominance.

RCA is a large dominant artery that gives rise to PDA and PLA. There
is no plaque.

Left main is a large artery that gives rise to LAD and LCX arteries.

LAD is a large vessel that has proximal and mid plaque (at diagonal
bifurcation), 25-49% stenosis. Will check FFR for validation.

LCX is a non-dominant artery that gives rise to one large OM1
branch. There is mild focal calcified plaque, 0-24% stenosis in the
mid AV groove segment.

Other findings:

Normal pulmonary vein drainage into the left atrium.

Normal left atrial appendage without a thrombus.

Normal size of the pulmonary artery.

Please see radiology report for non cardiac findings.
IMPRESSION: 1. Coronary calcium score of 224. This was 58 percentile for age and
sex matched control.

2. Normal coronary origin with right dominance.

3. Proximal LAD and mid LAD calcified plaque of 25-49%. Sending for
FFR for validation. Small focal circumflex calcified plaque 0-24%.

*** End of Addendum ***
EXAM:
OVER-READ INTERPRETATION  CT CHEST

The following report is an over-read performed by radiologist Dr.
Oukaci Zinab [REDACTED] on 04/23/2020. This over-read
does not include interpretation of cardiac or coronary anatomy or
pathology. The coronary CTA interpretation by the cardiologist is
attached.
FINDINGS: Vascular: Aortic atherosclerosis.

Mediastinum/Nodes: No imaged thoracic adenopathy.

Small to moderate hiatal hernia.

Lungs/Pleura: No pleural fluid. Mild centrilobular emphysema. Clear
imaged lungs.

Upper Abdomen: Normal imaged portions of the liver, spleen.

Musculoskeletal: Moderate thoracic spondylosis.
IMPRESSION: 1. No acute findings in the imaged extracardiac chest.
2. Small to moderate hiatal hernia.
3. Aortic Atherosclerosis (AN7F0-8D9.9) and Emphysema (AN7F0-PRQ.J).

## 2021-10-08 ENCOUNTER — Telehealth: Payer: Self-pay | Admitting: Pharmacist

## 2021-10-08 NOTE — Chronic Care Management (AMB) (Signed)
Chronic Care Management Pharmacy Assistant   Name: James Edwards  MRN: [id number] DOB: 07-30-1950  Reason for Encounter: Disease State   Conditions to be addressed/monitored: HTN  Recent office visits:  None  Recent consult visits:  07/24/21 Freada Bergeron, MD (Cardiology) - Patient presented for Persistent atrial fibrillation and other concerns. Prescribed Diltiazem 30 & 120 mg Stopped Metoprolol.  Hospital visits:  Medication Reconciliation was completed by comparing discharge summary, patient's EMR and Pharmacy list, and upon discussion with patient.  Patient presented to [facility] on 04/11/21 due to Radioactive seed implant/brachytherapy implant with cytoscopy. Patient was present for 5 hours.  New?Medications Started at [facility] Discharge:?? -started  tamsulosin (FLOMAX) traMADol Veatrice Bourbon)  Medication Changes at Hospital Discharge: -Changed  None  Medications Discontinued at Hospital Discharge: -Stopped  rivaroxaban 20 MG Tabs  Medications that remain the same after Hospital Discharge:??  -All other medications will remain the same.    Medications: Outpatient Encounter Medications as of 10/08/2021  Medication Sig   acetaminophen (TYLENOL) 500 MG tablet Take 1,000 mg by mouth every 6 (six) hours as needed for moderate pain.   diltiazem (CARDIZEM CD) 120 MG 24 hr capsule Take 1 capsule (120 mg total) by mouth daily.   diltiazem (CARDIZEM) 30 MG tablet Take 1 tablet (30 mg total) by mouth every 6 (six) hours as needed (breakthrough palpitations).   isosorbide mononitrate (IMDUR) 30 MG 24 hr tablet Take 0.5 tablets (15 mg total) by mouth daily.   losartan (COZAAR) 50 MG tablet Take 1 tablet (50 mg total) by mouth daily.   Multiple Vitamin (MULTIVITAMIN) tablet Take 1 tablet by mouth daily.   nitroGLYCERIN (NITROSTAT) 0.4 MG SL tablet Place 1 tablet (0.4 mg total) under the tongue every 5 (five) minutes as needed for chest pain.   oxybutynin  (DITROPAN-XL) 10 MG 24 hr tablet Take 10 mg by mouth at bedtime.   rivaroxaban (XARELTO) 20 MG TABS tablet Take 1 tablet (20 mg total) by mouth daily with supper.   rosuvastatin (CRESTOR) 20 MG tablet Take 1 tablet (20 mg total) by mouth daily.   tamsulosin (FLOMAX) 0.4 MG CAPS capsule Take 1 capsule (0.4 mg total) by mouth at bedtime.   traMADol (ULTRAM) 50 MG tablet Take 1-2 tablets (50-100 mg total) by mouth every 6 (six) hours as needed (pain). (Patient not taking: Reported on 07/24/2021)   traZODone (DESYREL) 50 MG tablet TAKE 1 TABLET BY MOUTH EVERY DAY AT BEDTIME AS NEEDED   trolamine salicylate (ASPERCREME) 10 % cream Apply 1 application topically as needed for muscle pain.   Turmeric 1053 MG TABS Take 3 tablets by mouth daily.   No facility-administered encounter medications on file as of 10/08/2021.   [place] for General Review Call Adherence Review:  Does the Clinical Pharmacist Assistant have access to adherence rates? Yes Adherence rates for STAR metric medications  Rosuvastatin (Crestor) 20 mg - Last filled 09/25/21 90 DS at [facility] Rosuvastatin (Crestor) 20 mg - Last filled 06/26/21 90 DS at [facility] Losartan (Cozaar) 50 mg - Last filled 09/07/21 90 DS at [facility] Losartan (Cozaar) 50 mg - Last filled 06/11/21 90 DS at [facility]  Does the patient have >5 day gap between last estimated fill dates for any of the above medications or other medication gaps? No   Disease State Questions:  Able to connect with Patient? No   Care Gaps: Urine Micro - Overdue TDAP - Overdue Zoster Vaccine - Overdue COVID  Booster - Overdue HGB A1C - Overdue Flu Vaccine - Overdue CCM- Bp- 132/72 07/24/21 AWV- 9/22 Lab Results  Component Value Date   HGBA1C 6.0 02/19/2021    Star Rating Drugs: Rosuvastatin (Crestor) 20 mg - Last filled 09/25/21 90 DS at [facility] Losartan (Cozaar) 50 mg - Last filled 09/07/21 90 DS at [place] Pharmacist  Assistant [phone]

## 2021-10-24 ENCOUNTER — Encounter: Payer: Self-pay | Admitting: *Deleted

## 2021-10-24 ENCOUNTER — Telehealth: Payer: Self-pay | Admitting: Cardiology

## 2021-10-24 NOTE — Telephone Encounter (Signed)
You want to avoid any product that says its "D" or "PE",  but DM (dextromethorphan) is ok. The "D" means it has pseudoephedrine (Sudafed) in it and "PE" means it has phenylephrine in it. These both can increase your blood pressure and heart rate. You also want to avoid NSAID's like ibuprofen and naproxen. Tylenol is ok to take for pain/fever. You can try saline nasal rinses (such as a netti pot) for congestion along with Flonase or Atrovent. Cough drops are ok too for sore throat.

## 2021-10-24 NOTE — Telephone Encounter (Signed)
Pt tested positive for covid today and would like a call back to discuss what he can take and any advice for things he should be doing with his heart conditions. Requesting call back.

## 2021-10-24 NOTE — Telephone Encounter (Signed)
Discussed recommendations with the pt over the phone.  Also endorsed to him that I will send him these recommendations to his mychart account for review.  Pt verbalized understanding and agrees with this plan.

## 2021-10-24 NOTE — Telephone Encounter (Signed)
Will route this message to our PharmD team to further assist with this request.   Will follow-up with the pt accordingly thereafter.

## 2021-10-30 ENCOUNTER — Telehealth: Payer: Self-pay | Admitting: *Deleted

## 2021-10-30 DIAGNOSIS — I4819 Other persistent atrial fibrillation: Secondary | ICD-10-CM

## 2021-10-30 MED ORDER — RIVAROXABAN 20 MG PO TABS: 20.0000 mg | ORAL_TABLET | Freq: Every day | ORAL | 1 refills | Status: DC

## 2021-10-30 NOTE — Telephone Encounter (Signed)
Patient called requesting Xarelto refill. Thank you

## 2021-10-30 NOTE — Telephone Encounter (Signed)
Prescription refill request for Xarelto received.  Indication: Afib Last office visit: 07/24/21 Johney Frame)  Weight: 88.2kg Age: 71 Scr: 1.06 (04/09/21) CrCl: 78.59m/min  Appropriate dose and refill sent to requested pharmacy.

## 2021-11-01 ENCOUNTER — Telehealth: Payer: Self-pay | Admitting: Cardiology

## 2021-11-01 DIAGNOSIS — I4819 Other persistent atrial fibrillation: Secondary | ICD-10-CM

## 2021-11-01 MED ORDER — RIVAROXABAN 20 MG PO TABS: 20.0000 mg | ORAL_TABLET | Freq: Every day | ORAL | 1 refills | Status: DC

## 2021-11-01 NOTE — Addendum Note (Signed)
Addended by: Leonidas Romberg on: 11/01/2021 01:05 PM   Modules accepted: Orders

## 2021-11-01 NOTE — Telephone Encounter (Signed)
Prescription refill request for Xarelto received.  Indication: Afib  Last office visit:07/24/21 James Edwards) Weight: 88.2kg Age: 71 Scr: 1.06 (04/09/21) CrCl: 78.19m/min  Appropriate dose and refill sent to requested pharmacy.

## 2021-11-01 NOTE — Telephone Encounter (Signed)
*  STAT* If patient is at the pharmacy, call can be transferred to refill team.   1. Which medications need to be refilled? (please list name of each medication and dose if known) rivaroxaban (XARELTO) 20 MG TABS tablet  2. Which pharmacy/location (including street and city if local pharmacy) is medication to be sent to? [place], [place] [address]    3. Do they need a 30 day or 90 day supply? 90 day

## 2021-11-08 ENCOUNTER — Other Ambulatory Visit: Payer: Self-pay | Admitting: *Deleted

## 2021-11-08 MED ORDER — DILTIAZEM HCL 30 MG PO TABS: 30.0000 mg | ORAL_TABLET | Freq: Four times a day (QID) | ORAL | 2 refills | Status: DC | PRN

## 2021-11-12 ENCOUNTER — Other Ambulatory Visit: Payer: Self-pay | Admitting: Adult Health

## 2021-11-12 DIAGNOSIS — G4700 Insomnia, unspecified: Secondary | ICD-10-CM

## 2021-11-18 ENCOUNTER — Ambulatory Visit (INDEPENDENT_AMBULATORY_CARE_PROVIDER_SITE_OTHER): Payer: Medicare Other

## 2021-11-18 VITALS — Ht 69.0 in | Wt 191.0 lb

## 2021-11-18 DIAGNOSIS — Z0000 Encounter for general adult medical examination without abnormal findings: Secondary | ICD-10-CM

## 2021-11-18 NOTE — Patient Instructions (Signed)
James Edwards , Thank you for taking time to come for your Medicare Wellness Visit. I appreciate your ongoing commitment to your health goals. Please review the following plan we discussed and let me know if I can assist you in the future.   Screening recommendations/referrals: Colonoscopy: completed 10/07/2017, due 10/08/2022 Recommended yearly ophthalmology/optometry visit for glaucoma screening and checkup Recommended yearly dental visit for hygiene and checkup  Vaccinations: Influenza vaccine: due Pneumococcal vaccine: completed 10/24/2015 Tdap vaccine: due Shingles vaccine: discussed   Covid-19:  12/17/2020, 12/13/2019, 04/30/2019, 04/02/2019  Advanced directives: Please bring a copy of your POA (Power of Attorney) and/or Living Will to your next appointment.   Conditions/risks identified: none  Next appointment: Follow up in one year for your annual wellness visit.   Preventive Care 72 Years and Older, Male Preventive care refers to lifestyle choices and visits with your health care provider that can promote health and wellness. What does preventive care include? A yearly physical exam. This is also called an annual well check. Dental exams once or twice a year. Routine eye exams. Ask your health care provider how often you should have your eyes checked. Personal lifestyle choices, including: Daily care of your teeth and gums. Regular physical activity. Eating a healthy diet. Avoiding tobacco and drug use. Limiting alcohol use. Practicing safe sex. Taking low doses of aspirin every day. Taking vitamin and mineral supplements as recommended by your health care provider. What happens during an annual well check? The services and screenings done by your health care provider during your annual well check will depend on your age, overall health, lifestyle risk factors, and family history of disease. Counseling  Your health care provider may ask you questions about your: Alcohol  use. Tobacco use. Drug use. Emotional well-being. Home and relationship well-being. Sexual activity. Eating habits. History of falls. Memory and ability to understand (cognition). Work and work Statistician. Screening  You may have the following tests or measurements: Height, weight, and BMI. Blood pressure. Lipid and cholesterol levels. These may be checked every 5 years, or more frequently if you are over 44 years old. Skin check. Lung cancer screening. You may have this screening every year starting at age 34 if you have a 30-pack-year history of smoking and currently smoke or have quit within the past 15 years. Fecal occult blood test (FOBT) of the stool. You may have this test every year starting at age 72. Flexible sigmoidoscopy or colonoscopy. You may have a sigmoidoscopy every 5 years or a colonoscopy every 10 years starting at age 72. Prostate cancer screening. Recommendations will vary depending on your family history and other risks. Hepatitis C blood test. Hepatitis B blood test. Sexually transmitted disease (STD) testing. Diabetes screening. This is done by checking your blood sugar (glucose) after you have not eaten for a while (fasting). You may have this done every 1-3 years. Abdominal aortic aneurysm (AAA) screening. You may need this if you are a current or former smoker. Osteoporosis. You may be screened starting at age 72 if you are at high risk. Talk with your health care provider about your test results, treatment options, and if necessary, the need for more tests. Vaccines  Your health care provider may recommend certain vaccines, such as: Influenza vaccine. This is recommended every year. Tetanus, diphtheria, and acellular pertussis (Tdap, Td) vaccine. You may need a Td booster every 10 years. Zoster vaccine. You may need this after age 19. Pneumococcal 13-valent conjugate (PCV13) vaccine. One dose is recommended  after age 52. Pneumococcal polysaccharide  (PPSV23) vaccine. One dose is recommended after age 35. Talk to your health care provider about which screenings and vaccines you need and how often you need them. This information is not intended to replace advice given to you by your health care provider. Make sure you discuss any questions you have with your health care provider. Document Released: 03/02/2015 Document Revised: 10/24/2015 Document Reviewed: 12/05/2014 Elsevier Interactive Patient Education  2017 [employer] Prevention in the Home Falls can cause injuries. They can happen to people of all ages. There are many things you can do to make your home safe and to help prevent falls. What can I do on the outside of my home? Regularly fix the edges of walkways and driveways and fix any cracks. Remove anything that might make you trip as you walk through a door, such as a raised step or threshold. Trim any bushes or trees on the path to your home. Use bright outdoor lighting. Clear any walking paths of anything that might make someone trip, such as rocks or tools. Regularly check to see if handrails are loose or broken. Make sure that both sides of any steps have handrails. Any raised decks and porches should have guardrails on the edges. Have any leaves, snow, or ice cleared regularly. Use sand or salt on walking paths during winter. Clean up any spills in your garage right away. This includes oil or grease spills. What can I do in the bathroom? Use night lights. Install grab bars by the toilet and in the tub and shower. Do not use towel bars as grab bars. Use non-skid mats or decals in the tub or shower. If you need to sit down in the shower, use a plastic, non-slip stool. Keep the floor dry. Clean up any water that spills on the floor as soon as it happens. Remove soap buildup in the tub or shower regularly. Attach bath mats securely with double-sided non-slip rug tape. Do not have throw rugs and other things on the  floor that can make you trip. What can I do in the bedroom? Use night lights. Make sure that you have a light by your bed that is easy to reach. Do not use any sheets or blankets that are too big for your bed. They should not hang down onto the floor. Have a firm chair that has side arms. You can use this for support while you get dressed. Do not have throw rugs and other things on the floor that can make you trip. What can I do in the kitchen? Clean up any spills right away. Avoid walking on wet floors. Keep items that you use a lot in easy-to-reach places. If you need to reach something above you, use a strong step stool that has a grab bar. Keep electrical cords out of the way. Do not use floor polish or wax that makes floors slippery. If you must use wax, use non-skid floor wax. Do not have throw rugs and other things on the floor that can make you trip. What can I do with my stairs? Do not leave any items on the stairs. Make sure that there are handrails on both sides of the stairs and use them. Fix handrails that are broken or loose. Make sure that handrails are as long as the stairways. Check any carpeting to make sure that it is firmly attached to the stairs. Fix any carpet that is loose or worn. Avoid having throw  rugs at the top or bottom of the stairs. If you do have throw rugs, attach them to the floor with carpet tape. Make sure that you have a light switch at the top of the stairs and the bottom of the stairs. If you do not have them, ask someone to add them for you. What else can I do to help prevent falls? Wear shoes that: Do not have high heels. Have rubber bottoms. Are comfortable and fit you well. Are closed at the toe. Do not wear sandals. If you use a stepladder: Make sure that it is fully opened. Do not climb a closed stepladder. Make sure that both sides of the stepladder are locked into place. Ask someone to hold it for you, if possible. Clearly mark and make  sure that you can see: Any grab bars or handrails. First and last steps. Where the edge of each step is. Use tools that help you move around (mobility aids) if they are needed. These include: Canes. Walkers. Scooters. Crutches. Turn on the lights when you go into a dark area. Replace any light bulbs as soon as they burn out. Set up your furniture so you have a clear path. Avoid moving your furniture around. If any of your floors are uneven, fix them. If there are any pets around you, be aware of where they are. Review your medicines with your doctor. Some medicines can make you feel dizzy. This can increase your chance of falling. Ask your doctor what other things that you can do to help prevent falls. This information is not intended to replace advice given to you by your health care provider. Make sure you discuss any questions you have with your health care provider. Document Released: 11/30/2008 Document Revised: 07/12/2015 Document Reviewed: 03/10/2014 Elsevier Interactive Patient Education  2017 [employer].

## 2021-11-18 NOTE — Progress Notes (Signed)
I connected with James Edwards today by telephone and verified that I am speaking with the correct person using two identifiers. Location patient: home Location provider: work Persons participating in the virtual visit: [place], [place].   I discussed the limitations, risks, security and privacy concerns of performing an evaluation and management service by telephone and the availability of in person appointments. I also discussed with the patient that there may be a patient responsible charge related to this service. The patient expressed understanding and verbally consented to this telephonic visit.    Interactive audio and video telecommunications were attempted between this provider and patient, however failed, due to patient having technical difficulties OR patient did not have access to video capability.  We continued and completed visit with audio only.     Vital signs may be patient reported or missing.  Subjective:   James Edwards is a 71 y.o. male who presents for Medicare Annual/Subsequent preventive examination.  Review of Systems     Cardiac Risk Factors include: advanced age (>66mn, >>68women);dyslipidemia;hypertension;male gender     Objective:    Today's Vitals   11/18/21 0841  Weight: 191 lb (86.6 kg)  Height: '5\' 9"'$  (1.753 m)  PainSc: 3    Body mass index is 28.21 kg/m.     11/18/2021    8:48 AM 05/02/2021   10:04 AM 04/11/2021   10:44 AM 01/24/2021    9:37 AM 11/15/2020   10:24 AM 08/13/2020   12:23 PM 05/18/2020    5:47 AM  Advanced Directives  Does Patient Have a Medical Advance Directive? Yes No No Yes Yes Yes Yes  Type of AParamed[place]of [place] will   [place]Living will [place] RiverLiving will HLive OakLiving will [place]Living will  Does patient want to make changes to medical advance directive?    Yes (ED - Information included  in AVS)   No - Patient declined  Copy of [place] Chart? No - copy requested    No - copy requested No - copy requested No - copy requested  Would patient like information on creating a medical advance directive?  Yes (ED - Information included in AVS) No - Patient declined        Current Medications (verified) Outpatient Encounter Medications as of 11/18/2021  Medication Sig   acetaminophen (TYLENOL) 500 MG tablet Take 1,000 mg by mouth every 6 (six) hours as needed for moderate pain.   diltiazem (CARDIZEM CD) 120 MG 24 hr capsule Take 1 capsule (120 mg total) by mouth daily.   diltiazem (CARDIZEM) 30 MG tablet Take 1 tablet (30 mg total) by mouth every 6 (six) hours as needed (breakthrough palpitations).   isosorbide mononitrate (IMDUR) 30 MG 24 hr tablet Take 0.5 tablets (15 mg total) by mouth daily.   Multiple Vitamin (MULTIVITAMIN) tablet Take 1 tablet by mouth daily.   oxybutynin (DITROPAN-XL) 10 MG 24 hr tablet Take 10 mg by mouth at bedtime.   rivaroxaban (XARELTO) 20 MG TABS tablet Take 1 tablet (20 mg total) by mouth daily with supper.   rosuvastatin (CRESTOR) 20 MG tablet Take 1 tablet (20 mg total) by mouth daily.   traZODone (DESYREL) 50 MG tablet TAKE 1 TABLET BY MOUTH EVERY DAY AT BEDTIME AS NEEDED   trolamine salicylate (ASPERCREME) 10 % cream Apply 1 application topically as needed for muscle pain.   Turmeric 1053 MG TABS Take  3 tablets by mouth daily.   losartan (COZAAR) 50 MG tablet Take 1 tablet (50 mg total) by mouth daily.   nitroGLYCERIN (NITROSTAT) 0.4 MG SL tablet Place 1 tablet (0.4 mg total) under the tongue every 5 (five) minutes as needed for chest pain.   tamsulosin (FLOMAX) 0.4 MG CAPS capsule Take 1 capsule (0.4 mg total) by mouth at bedtime. (Patient not taking: Reported on 11/18/2021)   traMADol (ULTRAM) 50 MG tablet Take 1-2 tablets (50-100 mg total) by mouth every 6 (six) hours as needed (pain). (Patient not taking: Reported on 07/24/2021)    No facility-administered encounter medications on file as of 11/18/2021.    Allergies (verified) Codeine   History: Past Medical History:  Diagnosis Date   BPH without obstruction/lower urinary tract symptoms    Coronary artery disease    cardiologist--- dr Johney Frame;  cath 05-18-2020  mininmal nonobstructive disease   ED (erectile dysfunction)    GERD (gastroesophageal reflux disease)    no meds   History of 2019 novel coronavirus disease (COVID-19) 2020   per pt asymptomatic   History of melanoma excision 06/2020   per pt localized area mid back   Hyperlipidemia, mixed    Hypertension    followed by pcp   Mild obstructive sleep apnea    followed by dr young;   study in epic 09-25-2020,  AHI 8.8/hr,  no cpap   OA (osteoarthritis)    Persistent atrial fibrillation ([place]) 03/2020   cardiologist-- dr h. Johney Frame---  on xarelto and bb;  work-up includes TTE, CTA, event monitor, and cardiac cath (results in epic),  pt ef 55-656 per cath and echo 55%   Pre-diabetes    Prostate cancer ([place]) 08/2009   urologist--- dr Alinda Money--- first dx 07/ 2011,  Gleason 3+3,  active surveillance since;  last bx 06/ 2022  Gleason 7 scheduled for brachtherapy   Wears glasses    Past Surgical History:  Procedure Laterality Date   COLONOSCOPY     last one 2019 approx   ELBOW SURGERY Right 2007   LEFT HEART CATH AND CORONARY ANGIOGRAPHY N/A 05/18/2020   Procedure: LEFT HEART CATH AND CORONARY ANGIOGRAPHY;  Surgeon: Martinique, Peter M, MD;  Location: [facility];  Service: Cardiovascular;  Laterality: N/A;   PROSTATE BIOPSY N/A 01/21/2016   Procedure: BIOPSY TRANSRECTAL ULTRASONIC PROSTATE (TUBP);  Surgeon: Raynelle Bring, MD;  Location: [facility];  Service: Urology;  Laterality: N/A;   PROSTATE BIOPSY N/A 07/15/2018   Procedure: NEEDLE BIOPSY TRANSRECTAL ULTRASONIC PROSTATE (TUBP);  Surgeon: Raynelle Bring, MD;  Location: [facility];  Service: Urology;  Laterality: N/A;   PROSTATE BIOPSY N/A 08/13/2020    Procedure: BIOPSY TRANSRECTAL ULTRASONIC PROSTATE (TUBP);  Surgeon: Raynelle Bring, MD;  Location[facility];  Service: Urology;  Laterality: N/A;   RADIOACTIVE SEED IMPLANT N/A 04/11/2021   Procedure: RADIOACTIVE SEED IMPLANT/BRACHYTHERAPY IMPLANT WITH CYSTOSCOPY;  Surgeon: Raynelle Bring, MD;  Location[facility];  Service: Urology;  Laterality: N/A;   SPACE OAR INSTILLATION N/A 04/11/2021   Procedure: SPACE OAR INSTILLATION;  Surgeon: Raynelle Bring, MD;  Location: [facility];  Service: Urology;  Laterality: N/A;   Family History  Problem Relation Age of Onset   Heart disease Mother    Hypertension Mother    Glaucoma Mother    Arthritis Father    Heart disease Father    Hypertension Father    Bladder Cancer Father 39   Colon polyps Father    Dementia  Brother    Lymphoma Cousin    Prostate cancer Cousin        mother's first cousin   Colon cancer Neg Hx    Esophageal cancer Neg Hx    Rectal cancer Neg Hx    Stomach cancer Neg Hx    Breast cancer Neg Hx    Social History   Socioeconomic History   Marital status: Married    Spouse name: Not on file   Number of children: 1   Years of education: Not on file   Highest education level: Not on file  Occupational History   Not on file  Tobacco Use   Smoking status: Former    Packs/day: 2.00    Years: 35.00    Total pack years: 70.00    Types: Cigarettes    Quit date: 2009    Years since quitting: 14.7   Smokeless tobacco: Never  Vaping Use   Vaping Use: Never used  Substance and Sexual Activity   Alcohol use: Yes    Alcohol/week: 42.0 standard drinks of alcohol    Types: 42 Cans of beer per week    Comment: 6pk beer per day  (12 oz each)   Drug use: Never   Sexual activity: Not on file  Other Topics Concern   Not on file  Social History Narrative   Lives in [place] with wife.   Retired.  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    Two Children who both live locally.       He likes to sleep.    Social Determinants of Health   Financial Resource Strain: Low Risk  (11/18/2021)   Overall Financial Resource Strain (CARDIA)    Difficulty of Paying Living Expenses: Not hard at all  Food Insecurity: No Food Insecurity (11/18/2021)   Hunger Vital Sign    Worried About Running Out of Food in the Last Year: Never true    Ran Out of Food in the Last Year: Never true  Transportation Needs: No Transportation Needs (11/18/2021)   PRAPARE - Hydrologist (Medical): No    Lack of Transportation (Non-Medical): No  Physical Activity: Sufficiently Active (11/18/2021)   Exercise Vital Sign    Days of Exercise per Week: 7 days    Minutes of Exercise per Session: 30 min  Stress: No Stress Concern Present (11/18/2021)   [place]    Feeling of Stress : Only a little  Social Connections: Moderately Isolated (11/15/2020)   Social Connection and Isolation Panel [NHANES]    Frequency of Communication with Friends and Family: Three times a week    Frequency of Social Gatherings with Friends and Family: Three times a week    Attends Religious Services: Never    Active Member of Clubs or Organizations: No    Attends Music therapist: Never    Marital Status: Married    Tobacco Counseling Counseling given: Not Answered   Clinical Intake:  Pre-visit preparation completed: Yes  Pain : 0-10 Pain Score: 3  Pain Type: Chronic pain Pain Location: Foot Pain Orientation: Left, Right Pain Descriptors / Indicators: Aching Pain Onset: More than a month ago Pain Frequency: Intermittent     Nutritional Status: BMI 25 -29 Overweight Nutritional Risks: None Diabetes: No  How often do you need to have someone help you when you read instructions, pamphlets, or  other written materials from your doctor or pharmacy?: 1 -  Never What is the last grade level you completed in school?: college  Diabetic? no  Interpreter Needed?: No  Information entered by :: NAllen LPN   Activities of Daily Living    11/18/2021    8:49 AM 04/11/2021   10:48 AM  In your present state of health, do you have any difficulty performing the following activities:  Hearing? 1 0  Vision? 1 0  Comment slight due to cataracts   Difficulty concentrating or making decisions? 1 0  Walking or climbing stairs? 0 0  Dressing or bathing? 0 0  Doing errands, shopping? 0   Preparing Food and eating ? N   Using the Toilet? N   In the past six months, have you accidently leaked urine? Y   Do you have problems with loss of bowel control? N   Managing your Medications? N   Managing your Finances? N   Housekeeping or managing your Housekeeping? N     Patient Care Team: [place] Peng, NP as PCP - General (Family Medicine) Freada Bergeron, MD as PCP - Cardiology (Cardiology) Thompson Grayer, MD as PCP - Electrophysiology (Clinical Cardiac Electrophysiology) [facility], [facility] Viona Gilmore, [facility] as Pharmacist (Pharmacist)  Indicate any recent Medical Services you may have received from other than [facility] providers in the past year (date may be approximate).     Assessment:   This is a routine wellness examination for James Edwards.  Hearing/Vision screen Vision Screening - Comments:: Regular eye exams, [facility]  Dietary issues and exercise activities discussed: Current Exercise Habits: Home exercise routine, Type of exercise: walking, Time (Minutes): 30, Frequency (Times/Week): 7, Weekly Exercise (Minutes/Week): 210   Goals Addressed             This Visit's Progress    Patient Stated       11/18/2021, no goals       Depression Screen    11/18/2021    8:49 AM 11/15/2020   10:23 AM 10/26/2019    9:09 AM 05/24/2019    8:19 AM 05/24/2019    8:18 AM 08/24/2017    9:29 AM 01/02/2017   10:03 AM  PHQ 2/9 Scores   PHQ - 2 Score 0 0 0 0 0 0 0  PHQ- 9 Score   0        Fall Risk    11/18/2021    8:49 AM 11/15/2020   10:25 AM 10/26/2019    9:08 AM 05/24/2019    8:18 AM 01/12/2019    5:42 PM  Fall Risk   Falls in the past year? 0 0 0 0 0  Comment     Emmi Telephone Survey: data to providers prior to load  Number falls in past yr: 0 0 0    Injury with Fall? 0 0 0    Risk for fall due to : Medication side effect  Medication side effect    Follow up Falls prevention discussed;Education provided;Falls evaluation completed Falls evaluation completed Falls evaluation completed;Falls prevention discussed      FALL RISK PREVENTION PERTAINING TO THE HOME:  Any stairs in or around the home? Yes  If so, are there any without handrails? No  Home free of loose throw rugs in walkways, pet beds, electrical cords, etc? Yes  Adequate lighting in your home to reduce risk of falls? Yes   ASSISTIVE DEVICES UTILIZED TO PREVENT FALLS:  Life alert? No  Use of a  cane, walker or w/c? No  Grab bars in the bathroom? Yes  Shower chair or bench in shower? No  Elevated toilet seat or a handicapped toilet? Yes   TIMED UP AND GO:  Was the test performed? No .      Cognitive Function:        11/18/2021    8:51 AM 10/26/2019    9:11 AM  6CIT Screen  What Year? 0 points 0 points  What month? 0 points 0 points  What time? 3 points 0 points  Count back from 20 0 points 0 points  Months in reverse 4 points 2 points  Repeat phrase 0 points 0 points  Total Score 7 points 2 points    Immunizations Immunization History  Administered Date(s) Administered   Influenza, High Dose Seasonal PF 10/18/2014, 10/24/2015, 12/05/2017, 12/07/2019, 12/17/2020   Moderna Covid-19 Vaccine Bivalent Booster 73yr & up 12/17/2020   Moderna SARS-COV2 Booster Vaccination 12/13/2019   Moderna Sars-Covid-2 Vaccination 04/02/2019, 04/30/2019   Pneumococcal Conjugate-13 10/18/2014   Pneumococcal Polysaccharide-23 10/24/2015    TDAP  status: Due, Education has been provided regarding the importance of this vaccine. Advised may receive this vaccine at local pharmacy or Health Dept. Aware to provide a copy of the vaccination record if obtained from local pharmacy or Health Dept. Verbalized acceptance and understanding.  Flu Vaccine status: Due, Education has been provided regarding the importance of this vaccine. Advised may receive this vaccine at local pharmacy or Health Dept. Aware to provide a copy of the vaccination record if obtained from local pharmacy or Health Dept. Verbalized acceptance and understanding.  Pneumococcal vaccine status: Up to date  Covid-19 vaccine status: Completed vaccines  Qualifies for Shingles Vaccine? Yes   Zostavax completed No   Shingrix Completed?: No.    Education has been provided regarding the importance of this vaccine. Patient has been advised to call insurance company to determine out of pocket expense if they have not yet received this vaccine. Advised may also receive vaccine at local pharmacy or Health Dept. Verbalized acceptance and understanding.  Screening Tests Health Maintenance  Topic Date Due   Diabetic kidney evaluation - Urine ACR  Never done   TETANUS/TDAP  Never done   Zoster Vaccines- Shingrix (1 of 2) Never done   COVID-19 Vaccine (4 - Moderna risk series) 02/11/2021   HEMOGLOBIN A1C  08/19/2021   INFLUENZA VACCINE  09/17/2021   Diabetic kidney evaluation - GFR measurement  04/09/2022   COLONOSCOPY (Pts 45-457yrInsurance coverage will need to be confirmed)  10/08/2022   Pneumonia Vaccine 6578Years old  Completed   Hepatitis C Screening  Completed   HPV VACCINES  Aged Out   FOOT EXAM  Discontinued   OPHTHALMOLOGY EXAM  Discontinued    Health Maintenance  Health Maintenance Due  Topic Date Due   Diabetic kidney evaluation - Urine ACR  Never done   TETANUS/TDAP  Never done   Zoster Vaccines- Shingrix (1 of 2) Never done   COVID-19 Vaccine (4 - Moderna risk  series) 02/11/2021   HEMOGLOBIN A1C  08/19/2021   INFLUENZA VACCINE  09/17/2021    Colorectal cancer screening: Type of screening: Colonoscopy. Completed 10/07/2017. Repeat every 5 years  Lung Cancer Screening: (Low Dose CT Chest recommended if Age 72-80ears, 30 pack-year currently smoking OR have quit w/in 15years.) does not qualify.   Lung Cancer Screening Referral: no  Additional Screening:  Hepatitis C Screening: does qualify; Completed 03/05/2018  Vision Screening: Recommended annual ophthalmology  exams for early detection of glaucoma and other disorders of the eye. Is the patient up to date with their annual eye exam?  Yes  Who is the provider or what is the name of the office in which the patient attends annual eye exams? [facility] If pt is not established with a provider, would they like to be referred to a provider to establish care? No .   Dental Screening: Recommended annual dental exams for proper oral hygiene  Community Resource Referral / Chronic Care Management: CRR required this visit?  No   CCM required this visit?  No      Plan:     I have personally reviewed and noted the following in the patient's chart:   Medical and social history Use of alcohol, tobacco or illicit drugs  Current medications and supplements including opioid prescriptions. Patient is not currently taking opioid prescriptions. Functional ability and status Nutritional status Physical activity Advanced directives List of other physicians Hospitalizations, surgeries, and ER visits in previous 12 months Vitals Screenings to include cognitive, depression, and falls Referrals and appointments  In addition, I have reviewed and discussed with patient certain preventive protocols, quality metrics, and best practice recommendations. A written personalized care plan for preventive services as well as general preventive health recommendations were provided to patient.     Kellie Simmering, LPN   63/09/[id number]   Nurse Notes: none  Due to this being a virtual visit, the after visit summary with patients personalized plan was offered to patient via mail or my-chart.  Patient would like to access on my-chart

## 2021-11-26 DIAGNOSIS — C61 Malignant neoplasm of prostate: Secondary | ICD-10-CM | POA: Diagnosis not present

## 2021-11-26 DIAGNOSIS — N401 Enlarged prostate with lower urinary tract symptoms: Secondary | ICD-10-CM | POA: Diagnosis not present

## 2021-12-02 LAB — PSA: PSA: 0.49

## 2021-12-03 DIAGNOSIS — L821 Other seborrheic keratosis: Secondary | ICD-10-CM | POA: Diagnosis not present

## 2021-12-03 DIAGNOSIS — Z85828 Personal history of other malignant neoplasm of skin: Secondary | ICD-10-CM | POA: Diagnosis not present

## 2021-12-03 DIAGNOSIS — L918 Other hypertrophic disorders of the skin: Secondary | ICD-10-CM | POA: Diagnosis not present

## 2021-12-03 DIAGNOSIS — L570 Actinic keratosis: Secondary | ICD-10-CM | POA: Diagnosis not present

## 2021-12-03 DIAGNOSIS — Z85820 Personal history of malignant melanoma of skin: Secondary | ICD-10-CM | POA: Diagnosis not present

## 2021-12-03 DIAGNOSIS — D1801 Hemangioma of skin and subcutaneous tissue: Secondary | ICD-10-CM | POA: Diagnosis not present

## 2021-12-05 DIAGNOSIS — H52223 Regular astigmatism, bilateral: Secondary | ICD-10-CM | POA: Diagnosis not present

## 2021-12-05 DIAGNOSIS — H2513 Age-related nuclear cataract, bilateral: Secondary | ICD-10-CM | POA: Diagnosis not present

## 2021-12-06 DIAGNOSIS — R3915 Urgency of urination: Secondary | ICD-10-CM | POA: Diagnosis not present

## 2021-12-06 DIAGNOSIS — N401 Enlarged prostate with lower urinary tract symptoms: Secondary | ICD-10-CM | POA: Diagnosis not present

## 2021-12-06 DIAGNOSIS — C61 Malignant neoplasm of prostate: Secondary | ICD-10-CM | POA: Diagnosis not present

## 2021-12-10 ENCOUNTER — Encounter: Payer: Self-pay | Admitting: Adult Health

## 2021-12-16 DIAGNOSIS — H2513 Age-related nuclear cataract, bilateral: Secondary | ICD-10-CM | POA: Diagnosis not present

## 2021-12-16 DIAGNOSIS — H2512 Age-related nuclear cataract, left eye: Secondary | ICD-10-CM | POA: Diagnosis not present

## 2021-12-17 ENCOUNTER — Ambulatory Visit: Payer: Medicare Other | Admitting: Family Medicine

## 2021-12-17 ENCOUNTER — Encounter: Payer: Self-pay | Admitting: Family Medicine

## 2021-12-17 ENCOUNTER — Telehealth: Payer: Self-pay | Admitting: Adult Health

## 2021-12-17 ENCOUNTER — Ambulatory Visit (INDEPENDENT_AMBULATORY_CARE_PROVIDER_SITE_OTHER): Payer: Medicare Other | Admitting: Family Medicine

## 2021-12-17 VITALS — BP 118/64 | HR 94 | Temp 98.2°F | Wt 197.0 lb

## 2021-12-17 DIAGNOSIS — M25562 Pain in left knee: Secondary | ICD-10-CM

## 2021-12-17 DIAGNOSIS — I2510 Atherosclerotic heart disease of native coronary artery without angina pectoris: Secondary | ICD-10-CM | POA: Diagnosis not present

## 2021-12-17 MED ORDER — HYDROCODONE-ACETAMINOPHEN 10-325 MG PO TABS: 1.0000 | ORAL_TABLET | Freq: Four times a day (QID) | ORAL | 0 refills | Status: AC | PRN

## 2021-12-17 MED ORDER — HYDROCODONE-ACETAMINOPHEN 10-325 MG PO TABS: 1.0000 | ORAL_TABLET | Freq: Four times a day (QID) | ORAL | 0 refills | Status: DC | PRN

## 2021-12-17 NOTE — Telephone Encounter (Signed)
Pt has been notified.

## 2021-12-17 NOTE — Telephone Encounter (Signed)
Patient states the prescription that was called in by Dr. Sarajane Jews today needs to be called in to [facility] on [address].  [facility] is out of this prescription.

## 2021-12-17 NOTE — Progress Notes (Signed)
   Subjective:    Patient ID: James Edwards, male    DOB: 02/07/1951, 71 y.o.   MRN: [id number]  HPI Here for the sudden onset of pain in the left knee 6 days ago. He says that 2 weeks before this pain started he stepped off an uneven sidewalk and fell onto both knees. He did not think much of it until this pain began. There has been no swelling or redness or warmth. No locking or giving way. The pain is worst when going up or down steps. He is wearing a brace and taking Tylenol, but this has not helped with pain. He is on Xarelto, so he cannot take NSAID's.    Review of Systems  Constitutional: Negative.   Respiratory: Negative.    Cardiovascular: Negative.   Musculoskeletal:  Positive for arthralgias.       Objective:   Physical Exam Constitutional:      General: He is not in acute distress.    Appearance: Normal appearance.     Comments: He walks normally   Cardiovascular:     Rate and Rhythm: Normal rate and regular rhythm.     Pulses: Normal pulses.     Heart sounds: Normal heart sounds.  Pulmonary:     Effort: Pulmonary effort is normal.     Breath sounds: Normal breath sounds.  Musculoskeletal:     Comments: Left knee appears normal. No swelling or warmth. No joint space tenderness, but he is quite tender in the popliteal fossa. ROM is full. No crepitus. McMurray's is negative.   Neurological:     Mental Status: He is alert.           Assessment & Plan:  Left knee pain. I suspect he has a small meniscal tear from the fall. He can use Norco 10-325 as needed for pain. Refer to Orthopedics.  Alysia Penna, MD

## 2021-12-17 NOTE — Telephone Encounter (Signed)
Done

## 2021-12-30 ENCOUNTER — Other Ambulatory Visit: Payer: Self-pay

## 2021-12-30 DIAGNOSIS — E782 Mixed hyperlipidemia: Secondary | ICD-10-CM

## 2021-12-30 DIAGNOSIS — I2510 Atherosclerotic heart disease of native coronary artery without angina pectoris: Secondary | ICD-10-CM

## 2021-12-30 DIAGNOSIS — I10 Essential (primary) hypertension: Secondary | ICD-10-CM

## 2021-12-30 DIAGNOSIS — I4819 Other persistent atrial fibrillation: Secondary | ICD-10-CM

## 2021-12-30 MED ORDER — ISOSORBIDE MONONITRATE ER 30 MG PO TB24: 15.0000 mg | ORAL_TABLET | Freq: Every day | ORAL | 2 refills | Status: DC

## 2022-01-01 DIAGNOSIS — Z23 Encounter for immunization: Secondary | ICD-10-CM | POA: Diagnosis not present

## 2022-01-14 ENCOUNTER — Other Ambulatory Visit: Payer: Self-pay

## 2022-01-14 MED ORDER — LOSARTAN POTASSIUM 50 MG PO TABS: 50.0000 mg | ORAL_TABLET | Freq: Every day | ORAL | 1 refills | Status: DC

## 2022-01-22 ENCOUNTER — Telehealth: Payer: Self-pay | Admitting: Pharmacist

## 2022-01-22 NOTE — Progress Notes (Deleted)
Cardiology Office Note:    Date:  01/24/2022   ID:  James Edwards, DOB 11-19-1950, MRN [id number]  PCP:  Dorothyann Peng, NP   [facility]  Cardiologist:  Freada Bergeron, MD  Advanced Practice Provider:  No care team member to display Electrophysiologist:  Thompson Grayer, MD   Referring MD: Dorothyann Peng, NP    History of Present Illness:    James Edwards is a 71 y.o. male with a hx of  DMII, HTN, persistent Afib, prostate cancer and GERD who presents to clinic for CV follow-up.  The patient was initially diagnosed with atrial fibrillation 03/27/20 after presenting to his PCP office. Patient reports his Apple Watch alerted him that he was in afib on 01/30/20 but did not have any other alerts until 03/2020. ECG at his PCP office confirmed afib. He was started on metoprolol for rate control and Xarelto for a CHADS2VASC score of 2. TTE was obtained which showed LVEF 55% with inferior basal hypokinesis, moderate LAE, mild/mod aortic sclerosis without stenosis.  Was seen in clinic on 04/16/20 where he was doing well. We obtained CTA on 04/23/20 for inferobasal hypokinesis noted on TTE, which showed mild LAD disease with Ca score 225 (58% for age-sex matched control).  FFR with normal prox and mid. Distal 0.78 but was smaller caliber. Was recommended for medical management.  Saw Dr. Rayann Heman in clinic on 05/02/20 where he was recommended for rate control of his Afib due to severe LA enlargement. He then presented to the ER in 05/2020 with chest pain. He underwent LHC which revealed mild, nonobstructive disease.  During visit on 07/19/20, he was having intermittent fatigue with his Afib. No significant chest pain or HF symptoms. We started metop XL with prn tartrate for breakthrough symptoms.  Was seen in clinic on 01/2021 where he continued to have intermittent fatigue and chest pain. He was in persistent Afib according to his apple watch but rates were controlled. We  started imdur to see if that helped relieve some of the chest pain.  Was last seen in clinic on 07/2021 where he was stable. Continued to feel fatigued. We changed metop to dilt to see if this helped.    Past Medical History:  Diagnosis Date   BPH without obstruction/lower urinary tract symptoms    Coronary artery disease    cardiologist--- dr Johney Frame;  cath 05-18-2020  mininmal nonobstructive disease   ED (erectile dysfunction)    GERD (gastroesophageal reflux disease)    no meds   History of 2019 novel coronavirus disease (COVID-19) 2020   per pt asymptomatic   History of melanoma excision 06/2020   per pt localized area mid back   Hyperlipidemia, mixed    Hypertension    followed by pcp   Mild obstructive sleep apnea    followed by dr young;   study in epic 09-25-2020,  AHI 8.8/hr,  no cpap   OA (osteoarthritis)    Persistent atrial fibrillation ([place]) 03/2020   cardiologist-- dr h. Johney Frame---  on xarelto and bb;  work-up includes TTE, CTA, event monitor, and cardiac cath (results in epic),  pt ef 55-656 per cath and echo 55%   Pre-diabetes    Prostate cancer ([place]) 08/2009   urologist--- dr Alinda Money--- first dx 07/ 2011,  Gleason 3+3,  active surveillance since;  last bx 06/ 2022  Gleason 7 scheduled for brachtherapy   Wears glasses     Past Surgical History:  Procedure Laterality  Date   COLONOSCOPY     last one 2019 approx   ELBOW SURGERY Right 2007   LEFT HEART CATH AND CORONARY ANGIOGRAPHY N/A 05/18/2020   Procedure: LEFT HEART CATH AND CORONARY ANGIOGRAPHY;  Surgeon: Martinique, Peter M, MD;  Location: [facility];  Service: Cardiovascular;  Laterality: N/A;   PROSTATE BIOPSY N/A 01/21/2016   Procedure: BIOPSY TRANSRECTAL ULTRASONIC PROSTATE (TUBP);  Surgeon: Raynelle Bring, MD;  Location: [facility];  Service: Urology;  Laterality: N/A;   PROSTATE BIOPSY N/A 07/15/2018   Procedure: NEEDLE BIOPSY TRANSRECTAL ULTRASONIC PROSTATE (TUBP);  Surgeon: Raynelle Bring, MD;   Location: [facility];  Service: Urology;  Laterality: N/A;   PROSTATE BIOPSY N/A 08/13/2020   Procedure: BIOPSY TRANSRECTAL ULTRASONIC PROSTATE (TUBP);  Surgeon: Raynelle Bring, MD;  Location: [facility];  Service: Urology;  Laterality: N/A;   RADIOACTIVE SEED IMPLANT N/A 04/11/2021   Procedure: RADIOACTIVE SEED IMPLANT/BRACHYTHERAPY IMPLANT WITH CYSTOSCOPY;  Surgeon: Raynelle Bring, MD;  Location: [facility];  Service: Urology;  Laterality: N/A;   SPACE OAR INSTILLATION N/A 04/11/2021   Procedure: SPACE OAR INSTILLATION;  Surgeon: Raynelle Bring, MD;  Location: [facility];  Service: Urology;  Laterality: N/A;    Current Medications: Current Meds  Medication Sig   acetaminophen (TYLENOL) 500 MG tablet Take 1,000 mg by mouth every 6 (six) hours as needed for moderate pain.   diltiazem (CARDIZEM CD) 120 MG 24 hr capsule Take 1 capsule (120 mg total) by mouth daily.   diltiazem (CARDIZEM) 30 MG tablet Take 1 tablet (30 mg total) by mouth every 6 (six) hours as needed (breakthrough palpitations).   isosorbide mononitrate (IMDUR) 30 MG 24 hr tablet Take 0.5 tablets (15 mg total) by mouth daily.   losartan (COZAAR) 50 MG tablet Take 1 tablet (50 mg total) by mouth daily.   Multiple Vitamin (MULTIVITAMIN) tablet Take 1 tablet by mouth daily.   rivaroxaban (XARELTO) 20 MG TABS tablet Take 1 tablet (20 mg total) by mouth daily with supper.   rosuvastatin (CRESTOR) 20 MG tablet Take 1 tablet (20 mg total) by mouth daily.   tamsulosin (FLOMAX) 0.4 MG CAPS capsule Take 1 capsule (0.4 mg total) by mouth at bedtime.   traZODone (DESYREL) 50 MG tablet TAKE 1 TABLET BY MOUTH EVERY DAY AT BEDTIME AS NEEDED   trolamine salicylate (ASPERCREME) 10 % cream Apply 1 application topically as needed for muscle pain.   Turmeric 1053 MG TABS Take 3 tablets by mouth daily.     Allergies:   Codeine   Social History   Socioeconomic History   Marital status: Married     Spouse name: Not on file   Number of children: 1   Years of education: Not on file   Highest education level: Not on file  Occupational History   Not on file  Tobacco Use   Smoking status: Former    Packs/day: 2.00    Years: 35.00    Total pack years: 70.00    Types: Cigarettes    Quit date: 2009    Years since quitting: 14.9   Smokeless tobacco: Never  Vaping Use   Vaping Use: Never used  Substance and Sexual Activity   Alcohol use: Yes    Alcohol/week: 42.0 standard drinks of alcohol    Types: 42 Cans of beer per week    Comment: 6pk beer per day  (12 oz each)   Drug use: Never   Sexual activity: Not on file  Other Topics Concern   Not on file  Social History Narrative   Lives in [place] with wife.   Retired.  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    Two Children who both live locally.       He likes to sleep.    Social Determinants of Health   Financial Resource Strain: Low Risk  (11/18/2021)   Overall Financial Resource Strain (CARDIA)    Difficulty of Paying Living Expenses: Not hard at all  Food Insecurity: No Food Insecurity (11/18/2021)   Hunger Vital Sign    Worried About Running Out of Food in the Last Year: Never true    Ran Out of Food in the Last Year: Never true  Transportation Needs: No Transportation Needs (11/18/2021)   PRAPARE - Hydrologist (Medical): No    Lack of Transportation (Non-Medical): No  Physical Activity: Sufficiently Active (11/18/2021)   Exercise Vital Sign    Days of Exercise per Week: 7 days    Minutes of Exercise per Session: 30 min  Stress: No Stress Concern Present (11/18/2021)   [place]    Feeling of Stress : Only a little  Social Connections: Moderately Isolated (11/15/2020)   Social Connection and Isolation Panel [NHANES]    Frequency of Communication with Friends and Family: Three  times a week    Frequency of Social Gatherings with Friends and Family: Three times a week    Attends Religious Services: Never    Active Member of Clubs or Organizations: No    Attends Archivist Meetings: Never    Marital Status: Married     Family History: The patient's family history includes Arthritis in his father; Bladder Cancer (age of onset: 21) in his father; Colon polyps in his father; Dementia in his brother; Glaucoma in his mother; Heart disease in his father and mother; Hypertension in his father and mother; Lymphoma in his cousin; Prostate cancer in his cousin. There is no history of Colon cancer, Esophageal cancer, Rectal cancer, Stomach cancer, or Breast cancer.  ROS:   Please see the history of present illness.    Review of Systems  Constitutional:  Positive for malaise/fatigue. Negative for chills and fever.  Respiratory:  Negative for shortness of breath.   Cardiovascular:  Negative for chest pain, palpitations, orthopnea, claudication, leg swelling and PND.  Gastrointestinal:  Negative for blood in stool, melena and nausea.  Genitourinary:  Negative for hematuria.  Musculoskeletal:  Positive for joint pain.  Neurological:  Negative for dizziness and loss of consciousness.  Endo/Heme/Allergies:  Negative for polydipsia.  Psychiatric/Behavioral:  Negative for substance abuse.     EKGs/Labs/Other Studies Reviewed:    The following studies were reviewed today: LHC 2020-06-06:  Prox LAD lesion is 20% stenosed. Dist Cx lesion is 30% stenosed. The left ventricular systolic function is normal. LV end diastolic pressure is normal. The left ventricular ejection fraction is 55-65% by visual estimate.   1. Minor nonobstructive CAD 2. Normal LV function 3. Normal LVEDP   Plan: risk factor modification.    Coronary CTA 04/23/20: FINDINGS: A 140 kV prospective scan was triggered in the descending thoracic aorta at 111 HU's. Axial non-contrast 3 mm slices were  carried out through the heart. The data set was analyzed on a dedicated work station and scored using the [place]. Gantry rotation speed was 250 msecs and  collimation was .6 mm. Beta blockade and 0.8 mg of sl NTG was given. The 3D data set was reconstructed in 5% intervals of the 67-82 % of the R-R cycle. Diastolic phases were analyzed on a dedicated work station using MPR, MIP and VRT modes. The patient received 80 cc of contrast.   Aorta:  Normal size.  Aortic atherosclerosis.  No dissection.   Aortic Valve:  Trileaflet.  No calcifications.   Coronary Arteries:  Normal coronary origin.  Right dominance.   RCA is a large dominant artery that gives rise to PDA and PLA. There is no plaque.   Left main is a large artery that gives rise to LAD and LCX arteries.   LAD is a large vessel that has proximal and mid plaque (at diagonal bifurcation), 25-49% stenosis. Will check FFR for validation.   LCX is a non-dominant artery that gives rise to one large OM1 branch. There is mild focal calcified plaque, 0-24% stenosis in the mid AV groove segment.   Other findings:   Normal pulmonary vein drainage into the left atrium.   Normal left atrial appendage without a thrombus.   Normal size of the pulmonary artery.   Please see radiology report for non cardiac findings.   IMPRESSION: 1. Coronary calcium score of 224. This was 5 percentile for age and sex matched control.   2. Normal coronary origin with right dominance.   3. Proximal LAD and mid LAD calcified plaque of 25-49%. Sending for FFR for validation. Small focal circumflex calcified plaque 0-24%.  FINDINGS: FFRct analysis was performed on the original cardiac CT angiogram dataset. Diagrammatic representation of the FFRct analysis is provided in a separate PDF document in PACS. This dictation was created using the PDF document and an interactive 3D model of the results. 3D model is not available in the EMR/PACS.  Normal FFR range is >0.80.   1. Left Main: Normal   2. LAD: Normal prox and mid. Distal 0.78 however this is smaller caliber vessel.   3. LCX: Normal   4. Ramus: N/A   5. RCA: Normal  Cardiac Monitor 05/08/20: Patch wear time was 6 days and 10 hours. There was 100% burden of atrial fibrillation with HR ranging from 56-172 bpm (average of 95bpm) There was 1 run of VT lasting 6 beats at HR 207bpm Rare PVCs     Patch Wear Time:  6 days and 10 hours (2022-03-08T19:46:41-0500 to 2022-03-15T07:40:13-0400)   1 run of Ventricular Tachycardia occurred lasting 6 beats with a max rate of 207 bpm (avg 192 bpm). Atrial Fibrillation occurred continuously (100% burden), ranging from 56-172 bpm (avg of 95 bpm). Isolated VEs were rare (<1.0%, 3688), VE Couplets were  rare (<1.0%, 215), and VE Triplets were rare (<1.0%, 4). Ventricular Trigeminy was present.    TTE 04/15/20: IMPRESSIONS   1. Inferior basal hypokinesis . Left ventricular ejection fraction, by  estimation, is 55%. The left ventricle has normal function. The left  ventricle has no regional wall motion abnormalities. Left ventricular  diastolic parameters are indeterminate.   2. Right ventricular systolic function is normal. The right ventricular  size is normal. There is normal pulmonary artery systolic pressure.   3. Left atrial size was moderately dilated.   4. The mitral valve is abnormal. Trivial mitral valve regurgitation. No  evidence of mitral stenosis.   5. The aortic valve is tricuspid. There is moderate calcification of the  aortic valve. Aortic valve regurgitation is not visualized. Mild to  moderate aortic  valve sclerosis/calcification is present, without any  evidence of aortic stenosis.   6. The inferior vena cava is normal in size with greater than 50%  respiratory variability, suggesting right atrial pressure of 3 mmHg.   Abdominal aortogram 03/2018:     Abdominal Aorta Findings:   +-----------+-------+----------+----------+--------+--------+--------+  Location   AP (cm)Trans (cm)PSV (cm/s)WaveformThrombusComments  +-----------+-------+----------+----------+--------+--------+--------+  Proximal   1.75   1.72      102                                 +-----------+-------+----------+----------+--------+--------+--------+  Mid        2.22   2.00      113                                 +-----------+-------+----------+----------+--------+--------+--------+  Distal     1.71   1.75      122                                 +-----------+-------+----------+----------+--------+--------+--------+  RT CIA Prox0.9    0.9       407                                 +-----------+-------+----------+----------+--------+--------+--------+  LT CIA Prox0.9    1.1       451                                 +-----------+-------+----------+----------+--------+--------+--------+       Summary:  Abdominal Aorta: No evidence of an abdominal aortic aneurysm was  visualized. The largest aortic measurement is 2.2 cm.  Stenosis: +------------------+-------------+  Location          Stenosis       +------------------+-------------+  Right Common Iliac>50% stenosis  +------------------+-------------+  Left Common Iliac >50% stenosis  +------------------+-------------+    Recent Labs: 02/19/2021: TSH 0.91 04/09/2021: ALT 32; BUN 18; Creatinine, Ser 1.06; Hemoglobin 12.9; Platelets 198; Potassium 5.1; Sodium 135  Recent Lipid Panel    Component Value Date/Time   CHOL 129 02/19/2021 1133   TRIG 174.0 (H) 02/19/2021 1133   HDL 58.70 02/19/2021 1133   CHOLHDL 2 02/19/2021 1133   VLDL 34.8 02/19/2021 1133   LDLCALC 35 02/19/2021 1133   LDLDIRECT 97.0 01/03/2016 0821   ECG: 01/18/21: Afib with HR 78  Risk Assessment/Calculations:    CHA2DS2-VASc Score = 3  This indicates a 3.2% annual risk of stroke. The patient's score is based  upon: CHF History: 0 HTN History: 1 Diabetes History: 0 Stroke History: 0 Vascular Disease History: 1 Age Score: 1 Gender Score: 0     Physical Exam:    VS:  BP 132/78   Pulse (!) 106   Ht '5\' 9"'$  (1.753 m)   Wt 199 lb (90.3 kg)   SpO2 97%   BMI 29.39 kg/m     Wt Readings from Last 3 Encounters:  01/24/22 199 lb (90.3 kg)  12/17/21 197 lb (89.4 kg)  11/18/21 191 lb (86.6 kg)     GEN:  Comfortable, NAD HEENT: Normal NECK: No JVD; No carotid bruits CARDIAC: Irregularly irregular, no murmurs, rubs, gallops RESPIRATORY:  CTAB ABDOMEN: Soft, non-tender,  non-distended MUSCULOSKELETAL:  No edema; No deformity  SKIN: Warm and dry NEUROLOGIC:  Alert and oriented x 3 PSYCHIATRIC:  Normal affect   ASSESSMENT:    1. Persistent atrial fibrillation ([place])   2. Essential hypertension   3. Mixed hyperlipidemia   4. Secondary hypercoagulable state ([place])   5. Mild coronary artery disease     PLAN:    In order of problems listed above:  #Mild Nonobstructive CAD: Patient with 20% prox LAD and 30% distal LCx stenosis on [facility] 05/18/20. On medical management.  -No ASA as patient on xarelto -Continue crestor '20mg'$  daily -Continue losartan '50mg'$  daily -Continue imdur '15mg'$  daily   #Persistent Afib: CHADs-vasc 3. Followed by Afib clinic. Unfortunately, not a great candidate for rhythm control due to severe LAE.  -Continue xarelto '20mg'$  daily -Continue diltiazem '120mg'$  daily -Continue dilt '30mg'$  TID prn for HR>100 -Metop stopped due to fatigue  #HTN: Well controlled and at goal <120/80s. -Continue diltiazem '120mg'$  daily -Continue losartan '50mg'$  daily -CMET today  #HLD: -Continue Crestor '20mg'$  daily -LDL 35 02/2021 -Repeat lipids for monitoring  #Prostate Cancer: Followed by Alinda Money. Gleason 6 adenocarcinoma.  -S/p radiation seed implant  -Follow up with Dr. Alinda Money as scheduled    Medication Adjustments/Labs and Tests Ordered: Current medicines are reviewed at length with the  patient today.  Concerns regarding medicines are outlined above.  No orders of the defined types were placed in this encounter.   No orders of the defined types were placed in this encounter.    There are no Patient Instructions on file for this visit.     Signed, Freada Bergeron, MD  01/24/2022 9:30 AM    [place]

## 2022-01-22 NOTE — Progress Notes (Signed)
Chronic Care Management Pharmacy Assistant   Name: Imaad Reuss  MRN: [id number] DOB: 11-06-1950  Reason for Encounter: Disease State   Recent office visits:  12/17/21 Laurey Morale, MD - Patient presented for Acute pain of left knee. Prescribed Norco 10-325.  11/18/21 Kellie Simmering, LPN - Patient presented for Medicare AWV. No medication changes.  Recent consult visits:  None   Hospital visits:  None in previous 6 months  Medications: Outpatient Encounter Medications as of 01/22/2022  Medication Sig   acetaminophen (TYLENOL) 500 MG tablet Take 1,000 mg by mouth every 6 (six) hours as needed for moderate pain.   diltiazem (CARDIZEM CD) 120 MG 24 hr capsule Take 1 capsule (120 mg total) by mouth daily.   diltiazem (CARDIZEM) 30 MG tablet Take 1 tablet (30 mg total) by mouth every 6 (six) hours as needed (breakthrough palpitations).   isosorbide mononitrate (IMDUR) 30 MG 24 hr tablet Take 0.5 tablets (15 mg total) by mouth daily.   losartan (COZAAR) 50 MG tablet Take 1 tablet (50 mg total) by mouth daily.   Multiple Vitamin (MULTIVITAMIN) tablet Take 1 tablet by mouth daily.   nitroGLYCERIN (NITROSTAT) 0.4 MG SL tablet Place 1 tablet (0.4 mg total) under the tongue every 5 (five) minutes as needed for chest pain.   rivaroxaban (XARELTO) 20 MG TABS tablet Take 1 tablet (20 mg total) by mouth daily with supper.   rosuvastatin (CRESTOR) 20 MG tablet Take 1 tablet (20 mg total) by mouth daily.   tamsulosin (FLOMAX) 0.4 MG CAPS capsule Take 1 capsule (0.4 mg total) by mouth at bedtime.   traZODone (DESYREL) 50 MG tablet TAKE 1 TABLET BY MOUTH EVERY DAY AT BEDTIME AS NEEDED   trolamine salicylate (ASPERCREME) 10 % cream Apply 1 application topically as needed for muscle pain.   Turmeric 1053 MG TABS Take 3 tablets by mouth daily.   No facility-administered encounter medications on file as of 01/22/2022.   Cut Off for General Review Call  Adherence  Review:  Does the Clinical Pharmacist Assistant have access to adherence rates? Yes Adherence rates for STAR metric medications  Rosuvastatin (Crestor) 20 mg - Last filled 12/19/21 90 DS at [facility] Rosuvastatin (Crestor) 20 mg - Last filled 09/25/21 90 DS at [facility] Losartan (Cozaar) 50 mg - Last filled 12/04/21 90 DS at [facility] Losartan (Cozaar) 50 mg - Last filled 09/07/21 90 DS at [place]  Does the patient have >5 day gap between last estimated fill dates for any of the above medications or other medication gaps? No    Disease State Questions:  Able to connect with Patient? Yes Did patient have any problems with their health recently? No  Have you had any admissions or emergency room visits or worsening of your condition(s) since last visit? No  Have you had any visits with new specialists or providers since your last visit? No  Have you had any new health care problem(s) since your last visit? No  Have you run out of any of your medications since you last spoke with clinical pharmacist? No  Are there any medications you are not taking as prescribed? No  Are you having any issues or side effects with your medications? No  Do you have any other health concerns or questions you want to discuss with your Clinical Pharmacist before your next visit? No  Are there any health concerns that you feel we can do a better job addressing? No  Are  you having any problems with any of the following since the last visit: (select all that apply)  None  12. Any falls since last visit? No  13. Any increased or uncontrolled pain since last visit? No   Notes: Patient reports he has been well since his last office visit knee is completely healed up fine no issues, he reports he is to follow up with Cardiology this week and is planning to get his RSV and Shingles vaccines before year end. He reports on concerns or issues at present.   Care Gaps: Diabetic Kidney Urine - Overdue TDAP - Overdue Zoster  Vaccine - Overdue Lung Cancer Screen - Overdue BP- 118/64 12/17/21 AWV- 11/18/21 Lab Results  Component Value Date   HGBA1C 6.0 02/19/2021     Star Rating Drugs: Rosuvastatin (Crestor) 20 mg - Last filled 12/19/21 90 DS at [facility] Losartan (Cozaar) 50 mg - Last filled 12/04/21 90 DS at [place] Pharmacist Assistant [phone]

## 2022-01-24 ENCOUNTER — Ambulatory Visit: Payer: Medicare Other | Attending: Cardiology | Admitting: Cardiology

## 2022-01-24 ENCOUNTER — Encounter: Payer: Self-pay | Admitting: Cardiology

## 2022-01-24 VITALS — BP 132/78 | HR 106 | Ht 69.0 in | Wt 199.0 lb

## 2022-01-24 DIAGNOSIS — R7301 Impaired fasting glucose: Secondary | ICD-10-CM | POA: Diagnosis not present

## 2022-01-24 DIAGNOSIS — Z006 Encounter for examination for normal comparison and control in clinical research program: Secondary | ICD-10-CM

## 2022-01-24 DIAGNOSIS — I4819 Other persistent atrial fibrillation: Secondary | ICD-10-CM | POA: Insufficient documentation

## 2022-01-24 DIAGNOSIS — I2510 Atherosclerotic heart disease of native coronary artery without angina pectoris: Secondary | ICD-10-CM | POA: Diagnosis not present

## 2022-01-24 DIAGNOSIS — D6869 Other thrombophilia: Secondary | ICD-10-CM | POA: Diagnosis not present

## 2022-01-24 DIAGNOSIS — I10 Essential (primary) hypertension: Secondary | ICD-10-CM | POA: Diagnosis not present

## 2022-01-24 DIAGNOSIS — E782 Mixed hyperlipidemia: Secondary | ICD-10-CM | POA: Diagnosis not present

## 2022-01-24 NOTE — Patient Instructions (Signed)
Medication Instructions:   Your physician recommends that you continue on your current medications as directed. Please refer to the Current Medication list given to you today.  *If you need a refill on your cardiac medications before your next appointment, please call your pharmacy*   Lab Work:  TODAY--CMET, CBC, TSH, LIPIDS, AND A1C  If you have labs (blood work) drawn today and your tests are completely normal, you will receive your results only by: [place] (if you have MyChart) OR A paper copy in the mail If you have any lab test that is abnormal or we need to change your treatment, we will call you to review the results.    Follow-Up: At [facility], you and your health needs are our priority.  As part of our continuing mission to provide you with exceptional heart care, we have created designated Provider Care Teams.  These Care Teams include your primary Cardiologist (physician) and Advanced Practice Providers (APPs -  Physician Assistants and Nurse Practitioners) who all work together to provide you with the care you need, when you need it.  We recommend signing up for the patient portal called "MyChart".  Sign up information is provided on this After Visit Summary.  MyChart is used to connect with patients for Virtual Visits (Telemedicine).  Patients are able to view lab/test results, encounter notes, upcoming appointments, etc.  Non-urgent messages can be sent to your provider as well.   To learn more about what you can do with MyChart, go to [email or web address].    Your next appointment:   6 month(s)  The format for your next appointment:   In Person  Provider:   Freada Bergeron, MD       Important Information About Sugar

## 2022-01-24 NOTE — Progress Notes (Signed)
Cardiology Office Note:    Date:  01/24/2022   ID:  James Edwards, DOB 1950-03-16, MRN [id number]  PCP:  Dorothyann Peng, NP   [facility]  Cardiologist:  Freada Bergeron, MD  Advanced Practice Provider:  No care team member to display Electrophysiologist:  Thompson Grayer, MD   Referring MD: Dorothyann Peng, NP    History of Present Illness:    James Edwards is a 71 y.o. male with a hx of  DMII, HTN, persistent Afib, prostate cancer and GERD who presents to clinic for CV follow-up.  The patient was initially diagnosed with atrial fibrillation 03/27/20 after presenting to his PCP office. Patient reports his Apple Watch alerted him that he was in afib on 01/30/20 but did not have any other alerts until 03/2020. ECG at his PCP office confirmed afib. He was started on metoprolol for rate control and Xarelto for a CHADS2VASC score of 2. TTE was obtained which showed LVEF 55% with inferior basal hypokinesis, moderate LAE, mild/mod aortic sclerosis without stenosis.  Was seen in clinic on 04/16/20 where he was doing well. We obtained CTA on 04/23/20 for inferobasal hypokinesis noted on TTE, which showed mild LAD disease with Ca score 225 (58% for age-sex matched control).  FFR with normal prox and mid. Distal 0.78 but was smaller caliber. Was recommended for medical management.  Saw Dr. Rayann Heman in clinic on 05/02/20 where he was recommended for rate control of his Afib due to severe LA enlargement. He then presented to the ER in 05/2020 with chest pain. He underwent LHC which revealed mild, nonobstructive disease.  During visit on 07/19/20, he was having intermittent fatigue with his Afib. No significant chest pain or HF symptoms. We started metop XL with prn tartrate for breakthrough symptoms.  Was seen in clinic on 01/2021 where he continued to have intermittent fatigue and chest pain. He was in persistent Afib according to his apple watch but rates were controlled. We  started imdur to see if that helped relieve some of the chest pain.  Was last seen in clinic on 07/2021 where he was stable. Continued to feel fatigued. We changed metop to dilt to see if this helped.   Today, the patient states that he continues to struggle with fatigue. It is slightly better since changing from metop to dilt but continues to persist. Remains in rate controlled Afib. No chest pain, SOB, orthopnea or PND. Tolerating xarelto as prescribed.    Past Medical History:  Diagnosis Date   BPH without obstruction/lower urinary tract symptoms    Coronary artery disease    cardiologist--- dr Johney Frame;  cath 05-18-2020  mininmal nonobstructive disease   ED (erectile dysfunction)    GERD (gastroesophageal reflux disease)    no meds   History of 2019 novel coronavirus disease (COVID-19) 2020   per pt asymptomatic   History of melanoma excision 06/2020   per pt localized area mid back   Hyperlipidemia, mixed    Hypertension    followed by pcp   Mild obstructive sleep apnea    followed by dr young;   study in epic 09-25-2020,  AHI 8.8/hr,  no cpap   OA (osteoarthritis)    Persistent atrial fibrillation ([place]) 03/2020   cardiologist-- dr h. Johney Frame---  on xarelto and bb;  work-up includes TTE, CTA, event monitor, and cardiac cath (results in epic),  pt ef 55-656 per cath and echo 55%   Pre-diabetes    Prostate cancer (  [place]) 08/2009   urologist--- dr Alinda Money--- first dx 07/ 2011,  Gleason 3+3,  active surveillance since;  last bx 06/ 2022  Gleason 7 scheduled for brachtherapy   Wears glasses     Past Surgical History:  Procedure Laterality Date   COLONOSCOPY     last one 2019 approx   ELBOW SURGERY Right 2007   LEFT HEART CATH AND CORONARY ANGIOGRAPHY N/A 05/18/2020   Procedure: LEFT HEART CATH AND CORONARY ANGIOGRAPHY;  Surgeon: Martinique, Peter M, MD;  Location: [facility];  Service: Cardiovascular;  Laterality: N/A;   PROSTATE BIOPSY N/A 01/21/2016   Procedure: BIOPSY  TRANSRECTAL ULTRASONIC PROSTATE (TUBP);  Surgeon: Raynelle Bring, MD;  Location: [facility];  Service: Urology;  Laterality: N/A;   PROSTATE BIOPSY N/A 07/15/2018   Procedure: NEEDLE BIOPSY TRANSRECTAL ULTRASONIC PROSTATE (TUBP);  Surgeon: Raynelle Bring, MD;  Location: [facility];  Service: Urology;  Laterality: N/A;   PROSTATE BIOPSY N/A 08/13/2020   Procedure: BIOPSY TRANSRECTAL ULTRASONIC PROSTATE (TUBP);  Surgeon: Raynelle Bring, MD;  Location: [facility];  Service: Urology;  Laterality: N/A;   RADIOACTIVE SEED IMPLANT N/A 04/11/2021   Procedure: RADIOACTIVE SEED IMPLANT/BRACHYTHERAPY IMPLANT WITH CYSTOSCOPY;  Surgeon: Raynelle Bring, MD;  Location: [facility];  Service: Urology;  Laterality: N/A;   SPACE OAR INSTILLATION N/A 04/11/2021   Procedure: SPACE OAR INSTILLATION;  Surgeon: Raynelle Bring, MD;  Location: [facility];  Service: Urology;  Laterality: N/A;    Current Medications: Current Meds  Medication Sig   acetaminophen (TYLENOL) 500 MG tablet Take 1,000 mg by mouth every 6 (six) hours as needed for moderate pain.   diltiazem (CARDIZEM CD) 120 MG 24 hr capsule Take 1 capsule (120 mg total) by mouth daily.   diltiazem (CARDIZEM) 30 MG tablet Take 1 tablet (30 mg total) by mouth every 6 (six) hours as needed (breakthrough palpitations).   isosorbide mononitrate (IMDUR) 30 MG 24 hr tablet Take 0.5 tablets (15 mg total) by mouth daily.   losartan (COZAAR) 50 MG tablet Take 1 tablet (50 mg total) by mouth daily.   Multiple Vitamin (MULTIVITAMIN) tablet Take 1 tablet by mouth daily.   rivaroxaban (XARELTO) 20 MG TABS tablet Take 1 tablet (20 mg total) by mouth daily with supper.   rosuvastatin (CRESTOR) 20 MG tablet Take 1 tablet (20 mg total) by mouth daily.   tamsulosin (FLOMAX) 0.4 MG CAPS capsule Take 1 capsule (0.4 mg total) by mouth at bedtime.   traZODone (DESYREL) 50 MG tablet TAKE 1 TABLET BY MOUTH EVERY DAY AT BEDTIME AS NEEDED   trolamine  salicylate (ASPERCREME) 10 % cream Apply 1 application topically as needed for muscle pain.   Turmeric 1053 MG TABS Take 3 tablets by mouth daily.     Allergies:   Codeine   Social History   Socioeconomic History   Marital status: Married    Spouse name: Not on file   Number of children: 1   Years of education: Not on file   Highest education level: Not on file  Occupational History   Not on file  Tobacco Use   Smoking status: Former    Packs/day: 2.00    Years: 35.00    Total pack years: 70.00    Types: Cigarettes    Quit date: 2009    Years since quitting: 14.9   Smokeless tobacco: Never  Vaping Use   Vaping Use: Never used  Substance and Sexual Activity   Alcohol use: Yes  Alcohol/week: 42.0 standard drinks of alcohol    Types: 42 Cans of beer per week    Comment: 6pk beer per day  (12 oz each)   Drug use: Never   Sexual activity: Not on file  Other Topics Concern   Not on file  Social History Narrative   Lives in [place] with wife.   Retired.  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    Two Children who both live locally.       He likes to sleep.    Social Determinants of Health   Financial Resource Strain: Low Risk  (11/18/2021)   Overall Financial Resource Strain (CARDIA)    Difficulty of Paying Living Expenses: Not hard at all  Food Insecurity: No Food Insecurity (11/18/2021)   Hunger Vital Sign    Worried About Running Out of Food in the Last Year: Never true    Ran Out of Food in the Last Year: Never true  Transportation Needs: No Transportation Needs (11/18/2021)   PRAPARE - Hydrologist (Medical): No    Lack of Transportation (Non-Medical): No  Physical Activity: Sufficiently Active (11/18/2021)   Exercise Vital Sign    Days of Exercise per Week: 7 days    Minutes of Exercise per Session: 30 min  Stress: No Stress Concern Present (11/18/2021)   [place]    Feeling of Stress : Only a little  Social Connections: Moderately Isolated (11/15/2020)   Social Connection and Isolation Panel [NHANES]    Frequency of Communication with Friends and Family: Three times a week    Frequency of Social Gatherings with Friends and Family: Three times a week    Attends Religious Services: Never    Active Member of Clubs or Organizations: No    Attends Archivist Meetings: Never    Marital Status: Married     Family History: The patient's family history includes Arthritis in his father; Bladder Cancer (age of onset: 2) in his father; Colon polyps in his father; Dementia in his brother; Glaucoma in his mother; Heart disease in his father and mother; Hypertension in his father and mother; Lymphoma in his cousin; Prostate cancer in his cousin. There is no history of Colon cancer, Esophageal cancer, Rectal cancer, Stomach cancer, or Breast cancer.  ROS:   Please see the history of present illness.    Review of Systems  Constitutional:  Positive for malaise/fatigue. Negative for chills and fever.  Respiratory:  Negative for shortness of breath.   Cardiovascular:  Positive for palpitations. Negative for chest pain, orthopnea, claudication, leg swelling and PND.  Gastrointestinal:  Negative for blood in stool, melena and nausea.  Genitourinary:  Negative for hematuria.  Neurological:  Negative for dizziness and loss of consciousness.  Endo/Heme/Allergies:  Negative for polydipsia.  Psychiatric/Behavioral:  Negative for substance abuse.     EKGs/Labs/Other Studies Reviewed:    The following studies were reviewed today:  LHC 05/18/20:  Prox LAD lesion is 20% stenosed. Dist Cx lesion is 30% stenosed. The left ventricular systolic function is normal. LV end diastolic pressure is normal. The left ventricular ejection fraction is 55-65% by visual estimate.   1. Minor nonobstructive CAD 2.  Normal LV function 3. Normal LVEDP   Plan: risk factor modification.    Long term monitor 05/08/20: Patch Wear Time:  6 days and 10 hours (2022-03-08T19:46:41-0500 to 2022-03-15T07:40:13-0400)  1 run of Ventricular Tachycardia occurred lasting 6 beats with a max rate of 207 bpm (avg 192 bpm). Atrial Fibrillation occurred continuously (100% burden), ranging from 56-172 bpm (avg of 95 bpm). Isolated VEs were rare (<1.0%, 3688), VE Couplets were  rare (<1.0%, 215), and VE Triplets were rare (<1.0%, 4). Ventricular Trigeminy was present.   CT coronary morph 04/23/20:   IMPRESSION: 1. Coronary calcium score of 224. This was 56 percentile for age and sex matched control.   2. Normal coronary origin with right dominance.   3. Proximal LAD and mid LAD calcified plaque of 25-49%. Sending for FFR for validation. Small focal circumflex calcified plaque 0-24%.  Coronary CTA 04/23/20: FINDINGS: A 140 kV prospective scan was triggered in the descending thoracic aorta at 111 HU's. Axial non-contrast 3 mm slices were carried out through the heart. The data set was analyzed on a dedicated work station and scored using the [place]. Gantry rotation speed was 250 msecs and collimation was .6 mm. Beta blockade and 0.8 mg of sl NTG was given. The 3D data set was reconstructed in 5% intervals of the 67-82 % of the R-R cycle. Diastolic phases were analyzed on a dedicated work station using MPR, MIP and VRT modes. The patient received 80 cc of contrast.   Aorta:  Normal size.  Aortic atherosclerosis.  No dissection.   Aortic Valve:  Trileaflet.  No calcifications.   Coronary Arteries:  Normal coronary origin.  Right dominance.   RCA is a large dominant artery that gives rise to PDA and PLA. There is no plaque.   Left main is a large artery that gives rise to LAD and LCX arteries.   LAD is a large vessel that has proximal and mid plaque (at diagonal bifurcation), 25-49% stenosis. Will check  FFR for validation.   LCX is a non-dominant artery that gives rise to one large OM1 branch. There is mild focal calcified plaque, 0-24% stenosis in the mid AV groove segment.   Other findings:   Normal pulmonary vein drainage into the left atrium.   Normal left atrial appendage without a thrombus.   Normal size of the pulmonary artery.   Please see radiology report for non cardiac findings.   IMPRESSION: 1. Coronary calcium score of 224. This was 23 percentile for age and sex matched control.   2. Normal coronary origin with right dominance.   3. Proximal LAD and mid LAD calcified plaque of 25-49%. Sending for FFR for validation. Small focal circumflex calcified plaque 0-24%.  FINDINGS: FFRct analysis was performed on the original cardiac CT angiogram dataset. Diagrammatic representation of the FFRct analysis is provided in a separate PDF document in PACS. This dictation was created using the PDF document and an interactive 3D model of the results. 3D model is not available in the EMR/PACS. Normal FFR range is >0.80.   1. Left Main: Normal   2. LAD: Normal prox and mid. Distal 0.78 however this is smaller caliber vessel.   3. LCX: Normal   4. Ramus: N/A   5. RCA: Normal  Cardiac Monitor 05/08/20: Patch wear time was 6 days and 10 hours. There was 100% burden of atrial fibrillation with HR ranging from 56-172 bpm (average of 95bpm) There was 1 run of VT lasting 6 beats at HR 207bpm Rare PVCs     Patch Wear Time:  6 days and 10 hours (2022-03-08T19:46:41-0500 to 2022-03-15T07:40:13-0400)   1 run of Ventricular Tachycardia occurred lasting 6 beats with a max  rate of 207 bpm (avg 192 bpm). Atrial Fibrillation occurred continuously (100% burden), ranging from 56-172 bpm (avg of 95 bpm). Isolated VEs were rare (<1.0%, 3688), VE Couplets were  rare (<1.0%, 215), and VE Triplets were rare (<1.0%, 4). Ventricular Trigeminy was present.    TTE 04/15/20: IMPRESSIONS    1. Inferior basal hypokinesis . Left ventricular ejection fraction, by  estimation, is 55%. The left ventricle has normal function. The left  ventricle has no regional wall motion abnormalities. Left ventricular  diastolic parameters are indeterminate.   2. Right ventricular systolic function is normal. The right ventricular  size is normal. There is normal pulmonary artery systolic pressure.   3. Left atrial size was moderately dilated.   4. The mitral valve is abnormal. Trivial mitral valve regurgitation. No  evidence of mitral stenosis.   5. The aortic valve is tricuspid. There is moderate calcification of the  aortic valve. Aortic valve regurgitation is not visualized. Mild to  moderate aortic valve sclerosis/calcification is present, without any  evidence of aortic stenosis.   6. The inferior vena cava is normal in size with greater than 50%  respiratory variability, suggesting right atrial pressure of 3 mmHg.   Abdominal aortogram 03/2018:     Abdominal Aorta Findings:  +-----------+-------+----------+----------+--------+--------+--------+  Location   AP (cm)Trans (cm)PSV (cm/s)WaveformThrombusComments  +-----------+-------+----------+----------+--------+--------+--------+  Proximal   1.75   1.72      102                                 +-----------+-------+----------+----------+--------+--------+--------+  Mid        2.22   2.00      113                                 +-----------+-------+----------+----------+--------+--------+--------+  Distal     1.71   1.75      122                                 +-----------+-------+----------+----------+--------+--------+--------+  RT CIA Prox0.9    0.9       407                                 +-----------+-------+----------+----------+--------+--------+--------+  LT CIA Prox0.9    1.1       451                                  +-----------+-------+----------+----------+--------+--------+--------+       Summary:  Abdominal Aorta: No evidence of an abdominal aortic aneurysm was  visualized. The largest aortic measurement is 2.2 cm.  Stenosis: +------------------+-------------+  Location          Stenosis       +------------------+-------------+  Right Common Iliac>50% stenosis  +------------------+-------------+  Left Common Iliac >50% stenosis  +------------------+-------------+    Recent Labs: 02/19/2021: TSH 0.91 04/09/2021: ALT 32; BUN 18; Creatinine, Ser 1.06; Hemoglobin 12.9; Platelets 198; Potassium 5.1; Sodium 135   Recent Lipid Panel    Component Value Date/Time   CHOL 129 02/19/2021 1133   TRIG 174.0 (H) 02/19/2021 1133   HDL 58.70 02/19/2021 1133   CHOLHDL 2 02/19/2021 1133  VLDL 34.8 02/19/2021 1133   LDLCALC 35 02/19/2021 1133   LDLDIRECT 97.0 01/03/2016 0821   ECG: EKG is personally reviewed. 01/24/22: Afib. Rate 94 bpm. 01/18/21: Afib with HR 78  Risk Assessment/Calculations:    CHA2DS2-VASc Score = 3  This indicates a 3.2% annual risk of stroke. The patient's score is based upon: CHF History: 0 HTN History: 1 Diabetes History: 0 Stroke History: 0 Vascular Disease History: 1 Age Score: 1 Gender Score: 0     Physical Exam:    VS:  BP 132/78   Pulse (!) 106   Ht 5' 9" (1.753 m)   Wt 199 lb (90.3 kg)   SpO2 97%   BMI 29.39 kg/m     Wt Readings from Last 3 Encounters:  01/24/22 199 lb (90.3 kg)  12/17/21 197 lb (89.4 kg)  11/18/21 191 lb (86.6 kg)     GEN:  Comfortable, no acute distress HEENT: Normal NECK: No JVD; No carotid bruits CARDIAC: Irregularly irregular, no murmurs, rubs, gallops RESPIRATORY:  CTAB ABDOMEN: Soft, non-tender, non-distended MUSCULOSKELETAL:  No edema; No deformity  SKIN: Warm and dry NEUROLOGIC:  Alert and oriented x 3 PSYCHIATRIC:  Normal affect   ASSESSMENT:    1. Persistent atrial fibrillation ([place])   2. Essential  hypertension   3. Mixed hyperlipidemia   4. Secondary hypercoagulable state ([place])   5. Mild coronary artery disease   6. Primary hypertension   7. Research subject   8. Impaired fasting glucose     PLAN:    In order of problems listed above:  #Mild Nonobstructive CAD: Patient with 20% prox LAD and 30% distal LCx stenosis on [facility] 05/18/20. On medical management.  -No ASA as patient on xarelto -Continue crestor 39m daily -Continue losartan 548mdaily -Continue imdur 1597maily   #Persistent Afib: CHADs-vasc 3. Followed by Afib clinic. Unfortunately, not a great candidate for rhythm control due to severe LAE.  -Continue xarelto 22m29mily -Continue diltiazem 122mg60mly -Continue dilt 30mg 17mprn for HR>100 -Metop stopped due to fatigue  #HTN: Well controlled and at goal <120/80s. -Continue diltiazem 122mg d72m -Continue losartan 50mg da58m-Check BMET  #HLD: -Continue Crestor 22mg dai62mLDL 35 02/2021 -Repeat lipids today  #Prostate Cancer: Followed by [facility]. GAlinda Money 6 adenocarcinoma.  -S/p radiation seed implant  -Follow up with Dr. Borden asAlinda Moneyuled    Follow-up: 6 months  Medication Adjustments/Labs and Tests Ordered: Current medicines are reviewed at length with the patient today.  Concerns regarding medicines are outlined above.  Orders Placed This Encounter  Procedures   Comp Met (CMET)   CBC   TSH   HgB A1c   Lipid Profile   EKG 12-Lead    No orders of the defined types were placed in this encounter.    Patient Instructions  Medication Instructions:   Your physician recommends that you continue on your current medications as directed. Please refer to the Current Medication list given to you today.  *If you need a refill on your cardiac medications before your next appointment, please call your pharmacy*   Lab Work:  TODAY--CMET, CBC, TSH, LIPIDS, AND A1C  If you have labs (blood work) drawn today and your tests are completely normal,  you will receive your results only by: MyChart [facility] MyChart) OR A paper copy in the mail If you have any lab test that is abnormal or we need to change your treatment, we will call you to review the results.  Follow-Up: At [facility], you and your health needs are our priority.  As part of our continuing mission to provide you with exceptional heart care, we have created designated Provider Care Teams.  These Care Teams include your primary Cardiologist (physician) and Advanced Practice Providers (APPs -  Physician Assistants and Nurse Practitioners) who all work together to provide you with the care you need, when you need it.  We recommend signing up for the patient portal called "MyChart".  Sign up information is provided on this After Visit Summary.  MyChart is used to connect with patients for Virtual Visits (Telemedicine).  Patients are able to view lab/test results, encounter notes, upcoming appointments, etc.  Non-urgent messages can be sent to your provider as well.   To learn more about what you can do with MyChart, go to [email or web address].    Your next appointment:   6 month(s)  The format for your next appointment:   In Person  Provider:   Freada Bergeron, MD       Important Information About [place] as a scribe for Freada Bergeron, MD.,have documented all relevant documentation on the behalf of Freada Bergeron, MD,as directed by  Freada Bergeron, MD while in the presence of Freada Bergeron, MD.  I, Freada Bergeron, MD, have reviewed all documentation for this visit. The documentation on 01/24/22 for the exam, diagnosis, procedures, and orders are all accurate and complete.   Signed, Freada Bergeron, MD  01/24/2022 9:45 AM    [place]

## 2022-01-25 LAB — COMPREHENSIVE METABOLIC PANEL
ALT: 31 IU/L (ref 0–44)
AST: 29 IU/L (ref 0–40)
Albumin/Globulin Ratio: 1.9 (ref 1.2–2.2)
Albumin: 4.3 g/dL (ref 3.8–4.8)
Alkaline Phosphatase: 41 IU/L — ABNORMAL LOW (ref 44–121)
BUN/Creatinine Ratio: 16 (ref 10–24)
BUN: 16 mg/dL (ref 8–27)
Bilirubin Total: 0.5 mg/dL (ref 0.0–1.2)
CO2: 23 mmol/L (ref 20–29)
Calcium: 10.4 mg/dL — ABNORMAL HIGH (ref 8.6–10.2)
Chloride: 102 mmol/L (ref 96–106)
Creatinine, Ser: 1.02 mg/dL (ref 0.76–1.27)
Globulin, Total: 2.3 g/dL (ref 1.5–4.5)
Glucose: 166 mg/dL — ABNORMAL HIGH (ref 70–99)
Potassium: 4.8 mmol/L (ref 3.5–5.2)
Sodium: 137 mmol/L (ref 134–144)
Total Protein: 6.6 g/dL (ref 6.0–8.5)
eGFR: 78 mL/min/{1.73_m2} (ref >59)

## 2022-01-25 LAB — CBC
Hematocrit: 39.2 % (ref 37.5–51.0)
Hemoglobin: 13.5 g/dL (ref 13.0–17.7)
MCH: 33.6 pg — ABNORMAL HIGH (ref 26.6–33.0)
MCHC: 34.4 g/dL (ref 31.5–35.7)
MCV: 98 fL — ABNORMAL HIGH (ref 79–97)
Platelets: 248 10*3/uL (ref 150–450)
RBC: 4.02 x10E6/uL — ABNORMAL LOW (ref 4.14–5.80)
RDW: 12.4 % (ref 11.6–15.4)
WBC: 6.3 10*3/uL (ref 3.4–10.8)

## 2022-01-25 LAB — LIPID PANEL
Chol/HDL Ratio: 2 ratio (ref 0.0–5.0)
Cholesterol, Total: 141 mg/dL (ref 100–199)
HDL: 70 mg/dL (ref >39)
LDL Chol Calc (NIH): 52 mg/dL (ref 0–99)
Triglycerides: 107 mg/dL (ref 0–149)
VLDL Cholesterol Cal: 19 mg/dL (ref 5–40)

## 2022-01-25 LAB — HEMOGLOBIN A1C
Est. average glucose Bld gHb Est-mCnc: 128 mg/dL
Hgb A1c MFr Bld: 6.1 % — ABNORMAL HIGH (ref 4.8–5.6)

## 2022-01-25 LAB — TSH: TSH: 1.26 u[IU]/mL (ref 0.450–4.500)

## 2022-02-20 DIAGNOSIS — H2513 Age-related nuclear cataract, bilateral: Secondary | ICD-10-CM | POA: Diagnosis not present

## 2022-02-20 DIAGNOSIS — H2512 Age-related nuclear cataract, left eye: Secondary | ICD-10-CM | POA: Diagnosis not present

## 2022-02-21 DIAGNOSIS — H2511 Age-related nuclear cataract, right eye: Secondary | ICD-10-CM | POA: Diagnosis not present

## 2022-02-21 DIAGNOSIS — H25011 Cortical age-related cataract, right eye: Secondary | ICD-10-CM | POA: Diagnosis not present

## 2022-02-21 DIAGNOSIS — H25041 Posterior subcapsular polar age-related cataract, right eye: Secondary | ICD-10-CM | POA: Diagnosis not present

## 2022-03-13 DIAGNOSIS — H2513 Age-related nuclear cataract, bilateral: Secondary | ICD-10-CM | POA: Diagnosis not present

## 2022-03-13 DIAGNOSIS — H2511 Age-related nuclear cataract, right eye: Secondary | ICD-10-CM | POA: Diagnosis not present

## 2022-03-18 ENCOUNTER — Other Ambulatory Visit: Payer: Self-pay

## 2022-03-18 MED ORDER — ROSUVASTATIN CALCIUM 20 MG PO TABS: 20.0000 mg | ORAL_TABLET | Freq: Every day | ORAL | 3 refills | Status: DC

## 2022-04-01 ENCOUNTER — Other Ambulatory Visit: Payer: Self-pay | Admitting: Adult Health

## 2022-04-01 DIAGNOSIS — G4700 Insomnia, unspecified: Secondary | ICD-10-CM

## 2022-04-22 ENCOUNTER — Telehealth: Payer: Self-pay

## 2022-04-22 NOTE — Progress Notes (Unsigned)
Care Management & Coordination Services Pharmacy Note  04/22/2022 Name:  Jubal Eshbaugh MRN:  [id number] DOB:  07/24/1950  Summary: BP at goal <130/80 LDL at goal <70   Recommendations/Changes made from today's visit: -Check BP at home once weekly and keep a log -Be mindful of salt intake in the diet -Continue statin medication and daily exercise -Reached out to urologist at patient request about restarting Gemtasa  Follow up plan: BP call in 3 months Pharmacist visit in 6 months   Subjective: Tomoki Santy is an 72 y.o. year old male who is a primary patient of Dorothyann Peng, NP.  The care coordination team was consulted for assistance with disease management and care coordination needs.    Engaged with patient by telephone for follow up visit.  Recent office visits: 12/17/21 Laurey Morale, MD - Patient presented for Acute pain of left knee. Prescribed Norco 10-325.   11/18/21 Kellie Simmering, LPN - Patient presented for Medicare AWV. No medication changes.  Recent consult visits: 01/24/22 Gwyndolyn Kaufman, MD (Cardio) - Persistent A-fib, no med changes  Hospital visits: None in previous 6 months   Objective:  Lab Results  Component Value Date   CREATININE 1.02 01/24/2022   BUN 16 01/24/2022   GFR 81.61 02/19/2021   EGFR 78 01/24/2022   GFRNONAA >60 04/09/2021   GFRAA >60 07/09/2018   NA 137 01/24/2022   K 4.8 01/24/2022   CALCIUM 10.4 (H) 01/24/2022   CO2 23 01/24/2022   GLUCOSE 166 (H) 01/24/2022    Lab Results  Component Value Date/Time   HGBA1C 6.1 (H) 01/24/2022 09:48 AM   HGBA1C 6.0 02/19/2021 11:33 AM   GFR 81.61 02/19/2021 11:33 AM   GFR 73.59 03/27/2020 02:37 PM    Last diabetic Eye exam: No results found for: "HMDIABEYEEXA"  Last diabetic Foot exam: No results found for: "HMDIABFOOTEX"   Lab Results  Component Value Date   CHOL 141 01/24/2022   HDL 70 01/24/2022   LDLCALC 52 01/24/2022   LDLDIRECT 97.0 01/03/2016   TRIG 107  01/24/2022   CHOLHDL 2.0 01/24/2022       Latest Ref Rng & Units 01/24/2022    9:48 AM 04/09/2021    1:28 PM 02/19/2021   11:33 AM  Hepatic Function  Total Protein 6.0 - 8.5 g/dL 6.6  6.7  6.8   Albumin 3.8 - 4.8 g/dL 4.3  3.9  4.1   AST 0 - 40 IU/L 29  33  33   ALT 0 - 44 IU/L 31  32  28   Alk Phosphatase 44 - 121 IU/L 41  30  28   Total Bilirubin 0.0 - 1.2 mg/dL 0.5  0.7  0.6     Lab Results  Component Value Date/Time   TSH 1.260 01/24/2022 09:48 AM   TSH 0.91 02/19/2021 11:33 AM       Latest Ref Rng & Units 01/24/2022    9:48 AM 04/09/2021    1:28 PM 02/19/2021   11:33 AM  CBC  WBC 3.4 - 10.8 x10E3/uL 6.3  7.3  4.9   Hemoglobin 13.0 - 17.7 g/dL 13.5  12.9  13.4   Hematocrit 37.5 - 51.0 % 39.2  36.9  40.2   Platelets 150 - 450 x10E3/uL 248  198  225.0     No results found for: "VD25OH", "VITAMINB12"  Clinical ASCVD: No  The 10-year ASCVD risk score (Arnett DK, et al., 2019) is: 33.5%   Values used  to calculate the score:     Age: 16 years     Sex: Male     Is Non-Hispanic African American: No     Diabetic: Yes     Tobacco smoker: No     Systolic Blood Pressure: Q000111Q mmHg     Is BP treated: Yes     HDL Cholesterol: 70 mg/dL     Total Cholesterol: 141 mg/dL       12/17/2021    9:52 AM 11/18/2021    8:49 AM 11/15/2020   10:23 AM  Depression screen PHQ 2/9  Decreased Interest 3 0 0  Down, Depressed, Hopeless 3 0 0  PHQ - 2 Score 6 0 0  Altered sleeping 0    Tired, decreased energy 2    Change in appetite 1    Feeling bad or failure about yourself  1    Trouble concentrating 1    Moving slowly or fidgety/restless 0    Suicidal thoughts 0    PHQ-9 Score 11    Difficult doing work/chores Very difficult       Social History   Tobacco Use  Smoking Status Former   Packs/day: 2.00   Years: 35.00   Total pack years: 70.00   Types: Cigarettes   Quit date: 2009   Years since quitting: 15.1  Smokeless Tobacco Never   BP Readings from Last 3 Encounters:   01/24/22 132/78  12/17/21 118/64  07/24/21 132/72   Pulse Readings from Last 3 Encounters:  01/24/22 (!) 106  12/17/21 94  07/24/21 71   Wt Readings from Last 3 Encounters:  01/24/22 199 lb (90.3 kg)  12/17/21 197 lb (89.4 kg)  11/18/21 191 lb (86.6 kg)   BMI Readings from Last 3 Encounters:  01/24/22 29.39 kg/m  12/17/21 29.09 kg/m  11/18/21 28.21 kg/m    Allergies  Allergen Reactions   Codeine Nausea And Vomiting    Medications Reviewed Today     Reviewed by Pricilla Handler, [place] (Certified Medical Assistant) on 01/24/22 at [phone]  Med List Status: <None>   Medication Order Taking? Sig Documenting Provider Last Dose Status Informant  acetaminophen (TYLENOL) 500 MG tablet [id number] Yes Take 1,000 mg by mouth every 6 (six) hours as needed for moderate pain. [provider] Taking Active Self  diltiazem (CARDIZEM CD) 120 MG 24 hr capsule [id number] Yes Take 1 capsule (120 mg total) by mouth daily. Freada Bergeron, MD Taking Active   diltiazem (CARDIZEM) 30 MG tablet [id number] Yes Take 1 tablet (30 mg total) by mouth every 6 (six) hours as needed (breakthrough palpitations). Freada Bergeron, MD Taking Active   isosorbide mononitrate (IMDUR) 30 MG 24 hr tablet [id number] Yes Take 0.5 tablets (15 mg total) by mouth daily. Freada Bergeron, MD Taking Active   losartan (COZAAR) 50 MG tablet [id number] Yes Take 1 tablet (50 mg total) by mouth daily. Freada Bergeron, MD Taking Active   Multiple Vitamin (MULTIVITAMIN) tablet [id number] Yes Take 1 tablet by mouth daily. [provider] Taking Active Self  nitroGLYCERIN (NITROSTAT) 0.4 MG SL tablet [id number]  Place 1 tablet (0.4 mg total) under the tongue every 5 (five) minutes as needed for chest pain. Freada Bergeron, MD  Expired 07/24/21 2359 Self           Med Note Lia Hopping, MINDY L   Thu Jul 19, 2020 10:25 AM)    rivaroxaban (XARELTO) 20 MG TABS tablet [id number] Yes Take 1 tablet (20 mg total) by  mouth daily with supper. Freada Bergeron, MD Taking Active   rosuvastatin (CRESTOR) 20 MG tablet [id number] Yes Take 1 tablet (20 mg total) by mouth daily. Freada Bergeron, MD Taking Active   tamsulosin [facility]) 0.4 MG CAPS capsule [id number] Yes Take 1 capsule (0.4 mg total) by mouth at bedtime. Raynelle Bring, MD Taking Active   traZODone (DESYREL) 50 MG tablet [id number] Yes TAKE 1 TABLET BY MOUTH EVERY DAY AT BEDTIME AS NEEDED Nafziger, Tommi Rumps, NP Taking Active   trolamine salicylate (ASPERCREME) 10 % cream [id number] Yes Apply 1 application topically as needed for muscle pain. [provider] Taking Active Self  Turmeric 1053 MG TABS [id number] Yes Take 3 tablets by mouth daily. [provider] Taking Active Self            SDOH:  (Social Determinants of Health) assessments and interventions performed: Yes SDOH Interventions    Flowsheet Row Clinical Support from 11/18/2021 in [place] at [place] from 11/15/2020 in [place] at [place] Management from 12/27/2019 in [place] at [place] from 10/26/2019 in [place] at [place] Interventions Intervention Not Indicated Intervention Not Indicated -- Intervention Not Indicated  Housing Interventions -- Intervention Not Indicated -- Intervention Not Indicated  Transportation Interventions Intervention Not Indicated Intervention Not Indicated Intervention Not Indicated Intervention Not Indicated  Depression Interventions/Treatment  -- -- -- PHQ2-9 Score <4 Follow-up Not Indicated  Financial Strain Interventions Intervention Not Indicated Intervention Not Indicated Intervention Not Indicated Intervention Not Indicated  Physical Activity Interventions Intervention Not Indicated Intervention Not Indicated -- Intervention Not Indicated  Stress  Interventions Intervention Not Indicated Intervention Not Indicated -- Intervention Not Indicated  Social Connections Interventions -- Intervention Not Indicated -- Intervention Not Indicated       Medication Assistance:  Gets Xarelto through mfr for $85-90 at a discount.  Medication Access: Within the past 30 days, how often has patient missed a dose of medication? None Is a pillbox or other method used to improve adherence? Yes  Factors that may affect medication adherence? financial need Are meds synced by current pharmacy? No  Are meds delivered by current pharmacy? No  Does patient experience delays in picking up medications due to transportation concerns? No   [facility] Reviewed: Is patient disadvantaged to use [facility]?: Yes  Current Rx insurance plan: [facility] Name and location of Current pharmacy:  [facility] # [place], [place] [address] Phone: [phone] Fax: [phone]  [id number] [place] [address] [place]: [phone]: 3[place]#[id number]-[place] [address]: [phone]: [phone] [facility] services reviewed with patient today?: No  Patient requests to transfer care to [facility]?: No  Reason patient declined to change pharmacies: Disadvantaged due to insurance/mail order  Compliance/Adherence/Medication fill history: Care Gaps: Diabetic urine - Overdue TDAP - Overdue Zoster Vaccine - Overdue COVID Booster - Overdue AWV - 11/18/21  Star-Rating Drugs: Rosuvastatin '20mg'$  PDC 100% Losartan '50mg'$  PDC 76%   Assessment/Plan   Hypertension (BP goal <130/80) -Controlled -Current treatment: Losartan '50mg'$  1 qd Appropriate, Effective, Safe,  Accessible -Medications previously tried: Metoprolol, Quinapril  -Current home  readings: not currently checking -Current dietary habits: mindful of salt intake -Current exercise habits: walks 2 miles daily -Denies hypotensive/hypertensive symptoms -Educated on BP goals and benefits of medications for prevention of heart attack, stroke and kidney damage; Daily salt intake goal < 2300 mg; Exercise goal of 150 minutes per week; Importance of home blood pressure monitoring; -Counseled to monitor BP at home once weekly, document, and provide log at future appointments -Recommended to continue current medication  Hyperlipidemia: (LDL goal < 70) -Controlled -Current treatment: Rosuvastatin '20mg'$  1 qd Appropriate, Effective, Safe, Accessible -Medications previously tried: Atorvastatin  -Current dietary patterns: see above -Current exercise habits: see above -Educated on Cholesterol goals;  Benefits of statin for ASCVD risk reduction; Importance of limiting foods high in cholesterol; Exercise goal of 150 minutes per week; -Recommended to continue current medication  Atrial Fibrillation (Goal: prevent stroke and major bleeding) -Controlled -CHADSVASC: 3 -Current treatment: Rate/Rhythym control:  Diltiazem CD '120mg'$  1 qd Appropriate, Effective, Safe, Accessible Diltiazem '30mg'$  q 6 h prn palpitations (taking every 6 hrs regularly) Appropriate, Effective, Safe, Accessible Anticoagulation: Xarelto '20mg'$  1 qd with supper Appropriate, Effective, Safe, Accessible -Medications previously tried: None -Home BP and HR readings: not checking  -Counseled on increased risk of stroke due to Afib and benefits of anticoagulation for stroke prevention; importance of adherence to anticoagulant exactly as prescribed; bleeding risk associated with xarelto and importance of self-monitoring for signs/symptoms of bleeding; avoidance of NSAIDs due to increased bleeding risk with anticoagulants; importance of  regular laboratory monitoring; seeking medical attention after a head injury or if there is blood in the urine/stool; -Recommended to continue current medication  [place] Pharmacist [phone]

## 2022-04-22 NOTE — Progress Notes (Signed)
Patient ID: James Edwards, male   DOB: 1950-03-27, 72 y.o.   MRN: [id number]  Care Management & Coordination Services Pharmacy Team  Reason for Encounter: Appointment Reminder  Contacted patient to confirm telephone appointment with Theo Dills PharmD on 04/23/22 at 1. Spoke with patient on 04/22/2022     Star Rating Drugs:  Rosuvastatin (Crestor) 20 mg - Last filled 03/18/22 90 DS at [facility] Losartan (Cozaar) 50 mg - Last filled 04/07/22 90 DS at [place]: Diabetic urine - Overdue TDAP - Overdue Zoster Vaccine - Overdue COVID Booster - Overdue AWV - 11/18/21      [place] Clinical Pharmacist Assistant [phone]

## 2022-04-23 ENCOUNTER — Ambulatory Visit: Payer: Medicare Other

## 2022-05-28 DIAGNOSIS — C61 Malignant neoplasm of prostate: Secondary | ICD-10-CM | POA: Diagnosis not present

## 2022-05-29 ENCOUNTER — Telehealth: Payer: Self-pay | Admitting: Cardiology

## 2022-05-29 DIAGNOSIS — I4819 Other persistent atrial fibrillation: Secondary | ICD-10-CM

## 2022-05-29 MED ORDER — RIVAROXABAN 20 MG PO TABS: 20.0000 mg | ORAL_TABLET | Freq: Every day | ORAL | 5 refills | Status: DC

## 2022-05-29 NOTE — Telephone Encounter (Signed)
Would see why pt is needing samples. His Xarelto dose is correct and I have sent in a refill to his [facility]. If copay is a concern, he can apply for "Xarelto with me" program that he did last year where cost is lower for Xarelto for pts in the donut hole. Ok to give a week of samples if needed.

## 2022-05-29 NOTE — Telephone Encounter (Signed)
Patient calling the office for samples of medication: ° ° °1.  What medication and dosage are you requesting samples for? rivaroxaban (XARELTO) 20 MG TABS tablet ° °2.  Are you currently out of this medication? yes ° ° °

## 2022-05-29 NOTE — Telephone Encounter (Signed)
Called pt and pt stated that he needed a week to get his medication from patient assistance,because he has already filled out patient assistance application. I advised pt that I was leaving him 1 bottle of Xarelto 20 mg tablet, a weeks supply, at [address] office front desk for him to pick up. I advised the pt that if he has any other problems, questions or concerns, to give our office a call back. Pt verbalized understanding.   Lot# [id number]   Exp:  10/25    FYI

## 2022-06-04 DIAGNOSIS — C61 Malignant neoplasm of prostate: Secondary | ICD-10-CM | POA: Diagnosis not present

## 2022-06-04 DIAGNOSIS — N3941 Urge incontinence: Secondary | ICD-10-CM | POA: Diagnosis not present

## 2022-06-10 DIAGNOSIS — Z85828 Personal history of other malignant neoplasm of skin: Secondary | ICD-10-CM | POA: Diagnosis not present

## 2022-06-10 DIAGNOSIS — L814 Other melanin hyperpigmentation: Secondary | ICD-10-CM | POA: Diagnosis not present

## 2022-06-10 DIAGNOSIS — Z85820 Personal history of malignant melanoma of skin: Secondary | ICD-10-CM | POA: Diagnosis not present

## 2022-06-10 DIAGNOSIS — D1801 Hemangioma of skin and subcutaneous tissue: Secondary | ICD-10-CM | POA: Diagnosis not present

## 2022-06-10 DIAGNOSIS — L570 Actinic keratosis: Secondary | ICD-10-CM | POA: Diagnosis not present

## 2022-06-10 DIAGNOSIS — D2271 Melanocytic nevi of right lower limb, including hip: Secondary | ICD-10-CM | POA: Diagnosis not present

## 2022-06-10 DIAGNOSIS — L821 Other seborrheic keratosis: Secondary | ICD-10-CM | POA: Diagnosis not present

## 2022-06-10 DIAGNOSIS — D485 Neoplasm of uncertain behavior of skin: Secondary | ICD-10-CM | POA: Diagnosis not present

## 2022-07-08 ENCOUNTER — Other Ambulatory Visit: Payer: Self-pay

## 2022-07-08 MED ORDER — DILTIAZEM HCL ER COATED BEADS 120 MG PO CP24: 120.0000 mg | ORAL_CAPSULE | Freq: Every day | ORAL | 1 refills | Status: DC

## 2022-07-22 ENCOUNTER — Telehealth: Payer: Self-pay

## 2022-07-22 NOTE — Progress Notes (Signed)
Patient ID: James Edwards, male   DOB: 10/01/1950, 72 y.o.   MRN: [id number] Care Management & Coordination Services Pharmacy Team  Reason for Encounter: Hypertension  Contacted patient to discuss hypertension disease state. Spoke with patient on 07/22/2022     Current antihypertensive regimen:  Losartan 50mg  1 qd   Patient verbally confirms he is taking the above medications as directed. No  How often are you checking your Blood Pressure? when feeling symptomatic Patient reports he has not been checking and was not near a machine to do so over the phone.   DATE:             BP               PULSE    What recent interventions/DTPs have been made by any provider to improve Blood Pressure control since last CPP Visit:  Patient reports he has still been monitoring his salt intake with meals and walking daily. He reports he has noticed some swelling in his ankles throughout the day that seems to go down by the morning after bed but as he is walking through the day returns. Advised him he could elevate his legs for a few min every couple of hours and see if that helps it some and if not to let PCP know so he is aware. Patient in agreement.  Adherence Review: Is the patient currently on ACE/ARB medication? Yes Does the patient have >5 day gap between last estimated fill dates? No  Star Rating Drugs:  Rosuvastatin (Crestor) 20 mg - Last filled 06/09/22 90 DS at [facility] Losartan (Cozaar) 50 mg - Last filled 05/07/22 90 DS at [facility]    Chart Updates: Recent office visits:  None  Recent consult visits:  None  Hospital visits:  None in previous 6 months  Medications: Outpatient Encounter Medications as of 07/22/2022  Medication Sig   acetaminophen (TYLENOL) 500 MG tablet Take 1,000 mg by mouth every 6 (six) hours as needed for moderate pain.   diltiazem (CARDIZEM CD) 120 MG 24 hr capsule Take 1 capsule (120 mg total) by mouth daily.   diltiazem (CARDIZEM) 30 MG tablet Take 1 tablet (30  mg total) by mouth every 6 (six) hours as needed (breakthrough palpitations).   isosorbide mononitrate (IMDUR) 30 MG 24 hr tablet Take 0.5 tablets (15 mg total) by mouth daily.   losartan (COZAAR) 50 MG tablet Take 1 tablet (50 mg total) by mouth daily.   Multiple Vitamin (MULTIVITAMIN) tablet Take 1 tablet by mouth daily.   nitroGLYCERIN (NITROSTAT) 0.4 MG SL tablet Place 1 tablet (0.4 mg total) under the tongue every 5 (five) minutes as needed for chest pain.   rivaroxaban (XARELTO) 20 MG TABS tablet Take 1 tablet (20 mg total) by mouth daily with supper.   rosuvastatin (CRESTOR) 20 MG tablet Take 1 tablet (20 mg total) by mouth daily.   tamsulosin (FLOMAX) 0.4 MG CAPS capsule Take 1 capsule (0.4 mg total) by mouth at bedtime.   traZODone (DESYREL) 50 MG tablet TAKE 1 TABLET BY MOUTH EVERY DAY AT BEDTIME AS NEEDED   trolamine salicylate (ASPERCREME) 10 % cream Apply 1 application topically as needed for muscle pain.   Turmeric 1053 MG TABS Take 3 tablets by mouth daily.   No facility-administered encounter medications on file as of 07/22/2022.    Recent Office Vitals: BP Readings from Last 3 Encounters:  01/24/22 132/78  12/17/21 118/64  07/24/21 132/72   Pulse Readings from Last 3  Encounters:  01/24/22 (!) 106  12/17/21 94  07/24/21 71    Wt Readings from Last 3 Encounters:  01/24/22 199 lb (90.3 kg)  12/17/21 197 lb (89.4 kg)  11/18/21 191 lb (86.6 kg)     Kidney Function Lab Results  Component Value Date/Time   CREATININE 1.02 01/24/2022 09:48 AM   CREATININE 1.06 04/09/2021 01:28 PM   GFR 81.61 02/19/2021 11:33 AM   GFRNONAA >60 04/09/2021 01:28 PM   GFRAA >60 07/09/2018 09:44 AM       Latest Ref Rng & Units 01/24/2022    9:48 AM 04/09/2021    1:28 PM 02/19/2021   11:33 AM  BMP  Glucose 70 - 99 mg/dL 161  096  045   BUN 8 - 27 mg/dL 16  18  15    Creatinine 0.76 - 1.27 mg/dL 4.09  8.11  9.14   BUN/Creat Ratio 10 - 24 16     Sodium 134 - 144 mmol/L 137  135  135    Potassium 3.5 - 5.2 mmol/L 4.8  5.1  4.5   Chloride 96 - 106 mmol/L 102  105  102   CO2 20 - 29 mmol/L 23  24  26    Calcium 8.6 - 10.2 mg/dL 78.2  95.6  9.9        Pamala Duffel CMA Clinical Pharmacist Assistant [phone]

## 2022-08-05 NOTE — Progress Notes (Signed)
Cardiology Office Note:    Date:  08/12/2022   ID:  James Edwards, DOB [birth year], MRN [id number]  PCP:  Shirline Frees, NP   [facility]  Cardiologist:  Meriam Sprague, MD  Advanced Practice Provider:  No care team member to display Electrophysiologist:  Hillis Range, MD (Inactive)   Referring MD: Shirline Frees, NP    History of Present Illness:    James Edwards is a 72 y.o. male with a hx of  DMII, HTN, persistent Afib, prostate cancer and GERD who presents to clinic for CV follow-up.  The patient was initially diagnosed with atrial fibrillation 03/27/20 after presenting to his PCP office. Patient reports his Apple Watch alerted him that he was in afib on 01/30/20 but did not have any other alerts until 03/2020. ECG at his PCP office confirmed afib. He was started on metoprolol for rate control and Xarelto for a CHADS2VASC score of 2. TTE was obtained which showed LVEF 55% with inferior basal hypokinesis, moderate LAE, mild/mod aortic sclerosis without stenosis.  Was seen in clinic on 04/16/20 where he was doing well. We obtained CTA on 04/23/20 for inferobasal hypokinesis noted on TTE, which showed mild LAD disease with Ca score 225 (58% for age-sex matched control).  FFR with normal prox and mid. Distal 0.78 but was smaller caliber. Was recommended for medical management.  Saw Dr. Johney Frame in clinic on 05/02/20 where he was recommended for rate control of his Afib due to severe LA enlargement. He then presented to the ER in 05/2020 with chest pain. He underwent LHC which revealed mild, nonobstructive disease.  During visit on 07/19/20, he was having intermittent fatigue with his Afib. No significant chest pain or HF symptoms. We started metop XL with prn tartrate for breakthrough symptoms.  Was seen in clinic on 01/2021 where he continued to have intermittent fatigue and chest pain. He was in persistent Afib according to his apple watch but rates were  controlled. We started imdur to see if that helped relieve some of the chest pain.  Seen in clinic on 01/2022 where he was stable. Continued to struggle with fatigue but otherwise no significant symptoms.  Today, the patient overall feels okay. Has noticed right ankle to lower shin swelling over the past 3 weeks. No associated injury or pain in the foot. No recent travel. Sits a lot during the day but states he will walk intermittently. Symptoms improve in the morning after propping his leg up with sleep. No chest pain, SOB, orthopnea or PND. Continues to have intermittent episodes of Afib and he feels fatigued when he goes out of rhythm. Each episode lasts about 15-59min and then he goes back into rhythm. Has been monitoring on his apple watch. Compliant with xarelto with no bleeding issues.    Past Medical History:  Diagnosis Date   BPH without obstruction/lower urinary tract symptoms    Coronary artery disease    cardiologist--- dr Shari Prows;  cath 05-18-2020  mininmal nonobstructive disease   ED (erectile dysfunction)    GERD (gastroesophageal reflux disease)    no meds   History of 2019 novel coronavirus disease (COVID-19) 2020   per pt asymptomatic   History of melanoma excision 06/2020   per pt localized area mid back   Hyperlipidemia, mixed    Hypertension    followed by pcp   Mild obstructive sleep apnea    followed by dr young;   study in epic 09-25-2020,  AHI  8.8/hr,  no cpap   OA (osteoarthritis)    Persistent atrial fibrillation (HCC) 03/2020   cardiologist-- dr h. Shari Prows---  on xarelto and bb;  work-up includes TTE, CTA, event monitor, and cardiac cath (results in epic),  pt ef 55-656 per cath and echo 55%   Pre-diabetes    Prostate cancer (HCC) 08/2009   urologist--- dr Laverle Patter--- first dx 07/ 2011,  Gleason 3+3,  active surveillance since;  last bx 06/ 2022  Gleason 7 scheduled for brachtherapy   Wears glasses     Past Surgical History:  Procedure Laterality Date    COLONOSCOPY     last one 2019 approx   ELBOW SURGERY Right 2007   LEFT HEART CATH AND CORONARY ANGIOGRAPHY N/A 05/18/2020   Procedure: LEFT HEART CATH AND CORONARY ANGIOGRAPHY;  Surgeon: Swaziland, Peter M, MD;  Location: [facility];  Service: Cardiovascular;  Laterality: N/A;   PROSTATE BIOPSY N/A 01/21/2016   Procedure: BIOPSY TRANSRECTAL ULTRASONIC PROSTATE (TUBP);  Surgeon: Heloise Purpura, MD;  Location: [facility];  Service: Urology;  Laterality: N/A;   PROSTATE BIOPSY N/A 07/15/2018   Procedure: NEEDLE BIOPSY TRANSRECTAL ULTRASONIC PROSTATE (TUBP);  Surgeon: Heloise Purpura, MD;  Location: [facility];  Service: Urology;  Laterality: N/A;   PROSTATE BIOPSY N/A 08/13/2020   Procedure: BIOPSY TRANSRECTAL ULTRASONIC PROSTATE (TUBP);  Surgeon: Heloise Purpura, MD;  Location: [facility];  Service: Urology;  Laterality: N/A;   RADIOACTIVE SEED IMPLANT N/A 04/11/2021   Procedure: RADIOACTIVE SEED IMPLANT/BRACHYTHERAPY IMPLANT WITH CYSTOSCOPY;  Surgeon: Heloise Purpura, MD;  Location: [facility];  Service: Urology;  Laterality: N/A;   SPACE OAR INSTILLATION N/A 04/11/2021   Procedure: SPACE OAR INSTILLATION;  Surgeon: Heloise Purpura, MD;  Location: [facility];  Service: Urology;  Laterality: N/A;    Current Medications: Current Meds  Medication Sig   acetaminophen (TYLENOL) 500 MG tablet Take 1,000 mg by mouth every 6 (six) hours as needed for moderate pain.   diltiazem (CARDIZEM CD) 120 MG 24 hr capsule Take 1 capsule (120 mg total) by mouth daily.   diltiazem (CARDIZEM) 30 MG tablet Take 1 tablet (30 mg total) by mouth every 6 (six) hours as needed (breakthrough palpitations).   isosorbide mononitrate (IMDUR) 30 MG 24 hr tablet Take 0.5 tablets (15 mg total) by mouth daily.   losartan (COZAAR) 50 MG tablet Take 1 tablet (50 mg total) by mouth daily.   Multiple Vitamin (MULTIVITAMIN) tablet Take 1 tablet by mouth daily.   rivaroxaban (XARELTO) 20 MG  TABS tablet Take 1 tablet (20 mg total) by mouth daily with supper.   rosuvastatin (CRESTOR) 20 MG tablet Take 1 tablet (20 mg total) by mouth daily.   tamsulosin (FLOMAX) 0.4 MG CAPS capsule Take 1 capsule (0.4 mg total) by mouth at bedtime.   traZODone (DESYREL) 50 MG tablet TAKE 1 TABLET BY MOUTH EVERY DAY AT BEDTIME AS NEEDED   trolamine salicylate (ASPERCREME) 10 % cream Apply 1 application topically as needed for muscle pain.   Turmeric 1053 MG TABS Take 3 tablets by mouth daily.     Allergies:   Codeine   Social History   Socioeconomic History   Marital status: Married    Spouse name: Not on file   Number of children: 1   Years of education: Not on file   Highest education level: Not on file  Occupational History   Not on file  Tobacco Use   Smoking status: Former  Packs/day: 2.00    Years: 35.00    Additional pack years: 0.00    Total pack years: 70.00    Types: Cigarettes    Quit date: 2009    Years since quitting: 15.4   Smokeless tobacco: Never  Vaping Use   Vaping Use: Never used  Substance and Sexual Activity   Alcohol use: Yes    Alcohol/week: 42.0 standard drinks of alcohol    Types: 42 Cans of beer per week    Comment: 6pk beer per day  (12 oz each)   Drug use: Never   Sexual activity: Not on file  Other Topics Concern   Not on file  Social History Narrative   Lives in [place] with wife.   Retired.  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    Two Children who both live locally.       He likes to sleep.    Social Determinants of Health   Financial Resource Strain: Low Risk  (11/18/2021)   Overall Financial Resource Strain (CARDIA)    Difficulty of Paying Living Expenses: Not hard at all  Food Insecurity: No Food Insecurity (04/23/2022)   Hunger Vital Sign    Worried About Running Out of Food in the Last Year: Never true    Ran Out of Food in the Last Year: Never true  Transportation Needs: No  Transportation Needs (11/18/2021)   PRAPARE - Administrator, Civil Service (Medical): No    Lack of Transportation (Non-Medical): No  Physical Activity: Sufficiently Active (11/18/2021)   Exercise Vital Sign    Days of Exercise per Week: 7 days    Minutes of Exercise per Session: 30 min  Stress: No Stress Concern Present (11/18/2021)   [employer] of Occupational Health - Occupational Stress Questionnaire    Feeling of Stress : Only a little  Social Connections: Moderately Isolated (11/15/2020)   Social Connection and Isolation Panel [NHANES]    Frequency of Communication with Friends and Family: Three times a week    Frequency of Social Gatherings with Friends and Family: Three times a week    Attends Religious Services: Never    Active Member of Clubs or Organizations: No    Attends Banker Meetings: Never    Marital Status: Married     Family History: The patient's family history includes Arthritis in his father; Bladder Cancer (age of onset: 42) in his father; Colon polyps in his father; Dementia in his brother; Glaucoma in his mother; Heart disease in his father and mother; Hypertension in his father and mother; Lymphoma in his cousin; Prostate cancer in his cousin. There is no history of Colon cancer, Esophageal cancer, Rectal cancer, Stomach cancer, or Breast cancer.  ROS:   Please see the history of present illness.      EKGs/Labs/Other Studies Reviewed:    The following studies were reviewed today:  LHC 05/18/20:  Prox LAD lesion is 20% stenosed. Dist Cx lesion is 30% stenosed. The left ventricular systolic function is normal. LV end diastolic pressure is normal. The left ventricular ejection fraction is 55-65% by visual estimate.   1. Minor nonobstructive CAD 2. Normal LV function 3. Normal LVEDP   Plan: risk factor modification.    Long term monitor 05/08/20: Patch Wear Time:  6 days and 10 hours (2022-03-08T19:46:41-0500 to  2022-03-15T07:40:13-0400)   1 run of Ventricular Tachycardia occurred lasting 6 beats with a max rate  of 207 bpm (avg 192 bpm). Atrial Fibrillation occurred continuously (100% burden), ranging from 56-172 bpm (avg of 95 bpm). Isolated VEs were rare (<1.0%, 3688), VE Couplets were  rare (<1.0%, 215), and VE Triplets were rare (<1.0%, 4). Ventricular Trigeminy was present.   CT coronary morph 04/23/20:   IMPRESSION: 1. Coronary calcium score of 224. This was 48 percentile for age and sex matched control.   2. Normal coronary origin with right dominance.   3. Proximal LAD and mid LAD calcified plaque of 25-49%. Sending for FFR for validation. Small focal circumflex calcified plaque 0-24%.  Coronary CTA 04/23/20: FINDINGS: A 140 kV prospective scan was triggered in the descending thoracic aorta at 111 HU's. Axial non-contrast 3 mm slices were carried out through the heart. The data set was analyzed on a dedicated work station and scored using the Agatson method. Gantry rotation speed was 250 msecs and collimation was .6 mm. Beta blockade and 0.8 mg of sl NTG was given. The 3D data set was reconstructed in 5% intervals of the 67-82 % of the R-R cycle. Diastolic phases were analyzed on a dedicated work station using MPR, MIP and VRT modes. The patient received 80 cc of contrast.   Aorta:  Normal size.  Aortic atherosclerosis.  No dissection.   Aortic Valve:  Trileaflet.  No calcifications.   Coronary Arteries:  Normal coronary origin.  Right dominance.   RCA is a large dominant artery that gives rise to PDA and PLA. There is no plaque.   Left main is a large artery that gives rise to LAD and LCX arteries.   LAD is a large vessel that has proximal and mid plaque (at diagonal bifurcation), 25-49% stenosis. Will check FFR for validation.   LCX is a non-dominant artery that gives rise to one large OM1 branch. There is mild focal calcified plaque, 0-24% stenosis in the mid AV groove  segment.   Other findings:   Normal pulmonary vein drainage into the left atrium.   Normal left atrial appendage without a thrombus.   Normal size of the pulmonary artery.   Please see radiology report for non cardiac findings.   IMPRESSION: 1. Coronary calcium score of 224. This was 61 percentile for age and sex matched control.   2. Normal coronary origin with right dominance.   3. Proximal LAD and mid LAD calcified plaque of 25-49%. Sending for FFR for validation. Small focal circumflex calcified plaque 0-24%.  FINDINGS: FFRct analysis was performed on the original cardiac CT angiogram dataset. Diagrammatic representation of the FFRct analysis is provided in a separate PDF document in PACS. This dictation was created using the PDF document and an interactive 3D model of the results. 3D model is not available in the EMR/PACS. Normal FFR range is >0.80.   1. Left Main: Normal   2. LAD: Normal prox and mid. Distal 0.78 however this is smaller caliber vessel.   3. LCX: Normal   4. Ramus: N/A   5. RCA: Normal  Cardiac Monitor 05/08/20: Patch wear time was 6 days and 10 hours. There was 100% burden of atrial fibrillation with HR ranging from 56-172 bpm (average of 95bpm) There was 1 run of VT lasting 6 beats at HR 207bpm Rare PVCs     Patch Wear Time:  6 days and 10 hours (2022-03-08T19:46:41-0500 to 2022-03-15T07:40:13-0400)   1 run of Ventricular Tachycardia occurred lasting 6 beats with a max rate of 207 bpm (avg 192 bpm). Atrial Fibrillation occurred continuously (100% burden),  ranging from 56-172 bpm (avg of 95 bpm). Isolated VEs were rare (<1.0%, 3688), VE Couplets were  rare (<1.0%, 215), and VE Triplets were rare (<1.0%, 4). Ventricular Trigeminy was present.    TTE 04/15/20: IMPRESSIONS   1. Inferior basal hypokinesis . Left ventricular ejection fraction, by  estimation, is 55%. The left ventricle has normal function. The left  ventricle has no regional  wall motion abnormalities. Left ventricular  diastolic parameters are indeterminate.   2. Right ventricular systolic function is normal. The right ventricular  size is normal. There is normal pulmonary artery systolic pressure.   3. Left atrial size was moderately dilated.   4. The mitral valve is abnormal. Trivial mitral valve regurgitation. No  evidence of mitral stenosis.   5. The aortic valve is tricuspid. There is moderate calcification of the  aortic valve. Aortic valve regurgitation is not visualized. Mild to  moderate aortic valve sclerosis/calcification is present, without any  evidence of aortic stenosis.   6. The inferior vena cava is normal in size with greater than 50%  respiratory variability, suggesting right atrial pressure of 3 mmHg.    Recent Labs: 01/24/2022: ALT 31; BUN 16; Creatinine, Ser 1.02; Hemoglobin 13.5; Platelets 248; Potassium 4.8; Sodium 137; TSH 1.260   Recent Lipid Panel    Component Value Date/Time   CHOL 141 01/24/2022 0948   TRIG 107 01/24/2022 0948   HDL 70 01/24/2022 0948   CHOLHDL 2.0 01/24/2022 0948   CHOLHDL 2 02/19/2021 1133   VLDL 34.8 02/19/2021 1133   LDLCALC 52 01/24/2022 0948   LDLDIRECT 97.0 01/03/2016 0821   ECG: No new tracing  Risk Assessment/Calculations:    CHA2DS2-VASc Score = 3  This indicates a 3.2% annual risk of stroke. The patient's score is based upon: CHF History: 0 HTN History: 1 Diabetes History: 0 Stroke History: 0 Vascular Disease History: 1 Age Score: 1 Gender Score: 0      Physical Exam:    VS:  BP 128/70   Pulse 85   Ht 5\' 10"  (1.778 m)   Wt 196 lb 3.2 oz (89 kg)   SpO2 97%   BMI 28.15 kg/m     Wt Readings from Last 3 Encounters:  08/12/22 196 lb 3.2 oz (89 kg)  01/24/22 199 lb (90.3 kg)  12/17/21 197 lb (89.4 kg)     GEN:  Comfortable, no acute distress HEENT: Normal NECK: No JVD; No carotid bruits CARDIAC: RRR, no murmurs RESPIRATORY:  Clear bilaterally ABDOMEN: Soft, non-tender,  non-distended MUSCULOSKELETAL:  RLE with 1+ edema in the ankle to lower shin; LLE with no edema. Both warm SKIN: Warm and dry NEUROLOGIC:  Alert and oriented x 3 PSYCHIATRIC:  Normal affect   ASSESSMENT:    1. Persistent atrial fibrillation (HCC)   2. Essential hypertension   3. Mixed hyperlipidemia   4. Primary hypertension   5. Mild coronary artery disease   6. Secondary hypercoagulable state (HCC)   7. Swelling of right lower extremity      PLAN:    In order of problems listed above:  #RLE Swelling: -Suspect this is secondary to venous stasis as symptoms worsened with the heat and improve with elevating the leg at night -Has been compliant with xarelto with lower suspicion for DVT but will rule out as patient is sedentary at baseline -Will check BNP to ensure no evidence of HF -Patient declined lasix at this time due to urinary frequency at baseline  #Mild Nonobstructive CAD: Patient with 20% prox  LAD and 30% distal LCx stenosis on [facility] 05/18/20. On medical management.  -No ASA as patient on xarelto -Continue crestor 20mg  daily -Continue losartan 50mg  daily -Continue imdur 15mg  daily   #Persistent Afib: CHADs-vasc 3. Followed by Afib clinic. Unfortunately, not a great candidate for ablation due to severe LAE. Declined rhythm controlling agent for now. Will continue with rate control.  -Continue xarelto 20mg  daily -Continue diltiazem 120mg  daily -Continue dilt 30mg  TID prn for HR>100 -Metop stopped due to fatigue  #HTN: Well controlled and at goal <120/80s. -Continue diltiazem 120mg  daily -Continue losartan 50mg  daily  #HLD: -Continue Crestor 20mg  daily -LDL controlled 52 on 01/2022  #Prostate Cancer: Followed by Laverle Patter. Gleason 6 adenocarcinoma.  -S/p radiation seed implant  -Follow up with Dr. Laverle Patter as scheduled    Follow-up: 6 months  Medication Adjustments/Labs and Tests Ordered: Current medicines are reviewed at length with the patient today.   Concerns regarding medicines are outlined above.  Orders Placed This Encounter  Procedures   Pro b natriuretic peptide   VAS [place] LOWER EXTREMITY VENOUS (DVT)    No orders of the defined types were placed in this encounter.    Patient Instructions  Medication Instructions:   Your physician recommends that you continue on your current medications as directed. Please refer to the Current Medication list given to you today.  *If you need a refill on your cardiac medications before your next appointment, please call your pharmacy*   Lab Work:  TODAY--PRO-BNP  If you have labs (blood work) drawn today and your tests are completely normal, you will receive your results only by: MyChart Message (if you have MyChart) OR A paper copy in the mail If you have any lab test that is abnormal or we need to change your treatment, we will call you to review the results.    Testing/Procedures:  Your physician has requested that you have a RIGHT lower extremity venous duplex. This test is an ultrasound of the veins in the legs or arms. It looks at venous blood flow that carries blood from the heart to the legs or arms. Allow one hour for a Lower Venous exam. Allow thirty minutes for an Upper Venous exam. There are no restrictions or special instructions.     Follow-Up: At [facility], you and your health needs are our priority.  As part of our continuing mission to provide you with exceptional heart care, we have created designated Provider Care Teams.  These Care Teams include your primary Cardiologist (physician) and Advanced Practice Providers (APPs -  Physician Assistants and Nurse Practitioners) who all work together to provide you with the care you need, when you need it.  We recommend signing up for the patient portal called "MyChart".  Sign up information is provided on this After Visit Summary.  MyChart is used to connect with patients for Virtual Visits (Telemedicine).   Patients are able to view lab/test results, encounter notes, upcoming appointments, etc.  Non-urgent messages can be sent to your provider as well.   To learn more about what you can do with MyChart, go to [email or web address].    Your next appointment:   6 month(s)  Provider:   DR. MARK SKAINS      Signed, Meriam Sprague, MD  08/12/2022 10:15 AM    [facility]

## 2022-08-11 ENCOUNTER — Other Ambulatory Visit: Payer: Self-pay

## 2022-08-11 MED ORDER — LOSARTAN POTASSIUM 50 MG PO TABS: 50.0000 mg | ORAL_TABLET | Freq: Every day | ORAL | 1 refills | Status: DC

## 2022-08-12 ENCOUNTER — Ambulatory Visit (INDEPENDENT_AMBULATORY_CARE_PROVIDER_SITE_OTHER): Payer: Medicare Other | Admitting: Cardiology

## 2022-08-12 ENCOUNTER — Ambulatory Visit (HOSPITAL_COMMUNITY)
Admission: RE | Admit: 2022-08-12 | Discharge: 2022-08-12 | Disposition: A | Discharge status: Home / Self Care | Payer: Medicare Other | Source: Ambulatory Visit | Attending: Cardiology | Admitting: Cardiology

## 2022-08-12 ENCOUNTER — Encounter: Payer: Self-pay | Admitting: Cardiology

## 2022-08-12 VITALS — BP 128/70 | HR 85 | Ht 70.0 in | Wt 196.2 lb

## 2022-08-12 DIAGNOSIS — I2510 Atherosclerotic heart disease of native coronary artery without angina pectoris: Secondary | ICD-10-CM | POA: Diagnosis not present

## 2022-08-12 DIAGNOSIS — E782 Mixed hyperlipidemia: Secondary | ICD-10-CM | POA: Diagnosis not present

## 2022-08-12 DIAGNOSIS — I4819 Other persistent atrial fibrillation: Secondary | ICD-10-CM

## 2022-08-12 DIAGNOSIS — M7989 Other specified soft tissue disorders: Secondary | ICD-10-CM | POA: Insufficient documentation

## 2022-08-12 DIAGNOSIS — I10 Essential (primary) hypertension: Secondary | ICD-10-CM | POA: Diagnosis not present

## 2022-08-12 DIAGNOSIS — D6869 Other thrombophilia: Secondary | ICD-10-CM | POA: Diagnosis not present

## 2022-08-12 NOTE — Patient Instructions (Signed)
Medication Instructions:   Your physician recommends that you continue on your current medications as directed. Please refer to the Current Medication list given to you today.  *If you need a refill on your cardiac medications before your next appointment, please call your pharmacy*   Lab Work:  TODAY--PRO-BNP  If you have labs (blood work) drawn today and your tests are completely normal, you will receive your results only by: MyChart Message (if you have MyChart) OR A paper copy in the mail If you have any lab test that is abnormal or we need to change your treatment, we will call you to review the results.    Testing/Procedures:  Your physician has requested that you have a RIGHT lower extremity venous duplex. This test is an ultrasound of the veins in the legs or arms. It looks at venous blood flow that carries blood from the heart to the legs or arms. Allow one hour for a Lower Venous exam. Allow thirty minutes for an Upper Venous exam. There are no restrictions or special instructions.     Follow-Up: At [facility], you and your health needs are our priority.  As part of our continuing mission to provide you with exceptional heart care, we have created designated Provider Care Teams.  These Care Teams include your primary Cardiologist (physician) and Advanced Practice Providers (APPs -  Physician Assistants and Nurse Practitioners) who all work together to provide you with the care you need, when you need it.  We recommend signing up for the patient portal called "MyChart".  Sign up information is provided on this After Visit Summary.  MyChart is used to connect with patients for Virtual Visits (Telemedicine).  Patients are able to view lab/test results, encounter notes, upcoming appointments, etc.  Non-urgent messages can be sent to your provider as well.   To learn more about what you can do with MyChart, go to [email or web address].    Your next appointment:   6  month(s)  Provider:   DR. MARK SKAINS

## 2022-08-13 ENCOUNTER — Encounter: Payer: Self-pay | Admitting: Internal Medicine

## 2022-08-13 LAB — PRO B NATRIURETIC PEPTIDE: NT-Pro BNP: 477 pg/mL — ABNORMAL HIGH (ref 0–376)

## 2022-10-01 ENCOUNTER — Other Ambulatory Visit: Payer: Self-pay

## 2022-10-01 DIAGNOSIS — I4819 Other persistent atrial fibrillation: Secondary | ICD-10-CM

## 2022-10-01 DIAGNOSIS — I10 Essential (primary) hypertension: Secondary | ICD-10-CM

## 2022-10-01 DIAGNOSIS — I2510 Atherosclerotic heart disease of native coronary artery without angina pectoris: Secondary | ICD-10-CM

## 2022-10-01 DIAGNOSIS — E782 Mixed hyperlipidemia: Secondary | ICD-10-CM

## 2022-10-01 MED ORDER — ISOSORBIDE MONONITRATE ER 30 MG PO TB24: 15.0000 mg | ORAL_TABLET | Freq: Every day | ORAL | 3 refills | Status: DC

## 2022-10-18 ENCOUNTER — Other Ambulatory Visit: Payer: Self-pay | Admitting: Adult Health

## 2022-10-18 DIAGNOSIS — G4700 Insomnia, unspecified: Secondary | ICD-10-CM

## 2022-11-03 DIAGNOSIS — R8279 Other abnormal findings on microbiological examination of urine: Secondary | ICD-10-CM | POA: Diagnosis not present

## 2022-11-03 DIAGNOSIS — N3941 Urge incontinence: Secondary | ICD-10-CM | POA: Diagnosis not present

## 2022-11-03 DIAGNOSIS — N401 Enlarged prostate with lower urinary tract symptoms: Secondary | ICD-10-CM | POA: Diagnosis not present

## 2022-11-24 ENCOUNTER — Ambulatory Visit (INDEPENDENT_AMBULATORY_CARE_PROVIDER_SITE_OTHER): Payer: Medicare Other

## 2022-11-24 VITALS — Ht 70.0 in | Wt 190.0 lb

## 2022-11-24 DIAGNOSIS — Z0000 Encounter for general adult medical examination without abnormal findings: Secondary | ICD-10-CM | POA: Diagnosis not present

## 2022-11-24 DIAGNOSIS — Z1211 Encounter for screening for malignant neoplasm of colon: Secondary | ICD-10-CM

## 2022-11-24 NOTE — Patient Instructions (Addendum)
[name] , Thank you for taking time to come for your Medicare Wellness Visit. I appreciate your ongoing commitment to your health goals. Please review the following plan we discussed and let me know if I can assist you in the future.   Referrals/Orders/Follow-Ups/Clinician Recommendations:   This is a list of the screening recommended for you and due dates:  Health Maintenance  Topic Date Due   Yearly kidney health urinalysis for diabetes  Never done   DTaP/Tdap/Td vaccine (1 - Tdap) Never done   Zoster (Shingles) Vaccine (1 of 2) Never done   Hemoglobin A1C  07/26/2022   Flu Shot  09/18/2022   Colon Cancer Screening  10/08/2022   COVID-19 Vaccine (5 - 2023-24 season) 10/19/2022   Yearly kidney function blood test for diabetes  01/25/2023   Medicare Annual Wellness Visit  11/24/2023   Pneumonia Vaccine  Completed   Hepatitis C Screening  Completed   HPV Vaccine  Aged Out   Screening for Lung Cancer  Discontinued   Complete foot exam   Discontinued   Eye exam for diabetics  Discontinued    Advanced directives: (Declined) Advance directive discussed with you today. Even though you declined this today, please call our office should you change your mind, and we can give you the proper paperwork for you to fill out.  Next Medicare Annual Wellness Visit scheduled for next year: Yes

## 2022-11-24 NOTE — Progress Notes (Signed)
Subjective:   James Edwards is a 72 y.o. male who presents for Medicare Annual/Subsequent preventive examination.  Visit Complete: Virtual  I connected with  James Edwards on 11/24/22 by a audio enabled telemedicine application and verified that I am speaking with the correct person using two identifiers.  Patient Location: Home  Provider Location: Home Office  I discussed the limitations of evaluation and management by telemedicine. The patient expressed understanding and agreed to proceed.   Because this visit was a virtual/telehealth visit, some criteria may be missing or patient reported. Any vitals not documented were not able to be obtained and vitals that have been documented are patient reported.   Cardiac Risk Factors include: advanced age (>92men, >37 women);male gender;hypertension     Objective:    Today's Vitals   11/24/22 0856  Weight: 190 lb (86.2 kg)  Height: 5\' 10"  (1.778 m)   Body mass index is 27.26 kg/m.     11/24/2022    9:04 AM 11/18/2021    8:48 AM 05/02/2021   10:04 AM 04/11/2021   10:44 AM 01/24/2021    9:37 AM 11/15/2020   10:24 AM 08/13/2020   12:23 PM  Advanced Directives  Does Patient Have a Medical Advance Directive? No Yes No No Yes Yes Yes  Type of Furniture conservator/restorer;Living will   Healthcare Power of [place];Living will Healthcare Power of [place];Living will Healthcare Power of [place];Living will  Does patient want to make changes to medical advance directive?     Yes (ED - Information included in AVS)    Copy of Healthcare Power of Attorney in Chart?  No - copy requested    No - copy requested No - copy requested  Would patient like information on creating a medical advance directive? No - Patient declined  Yes (ED - Information included in AVS) No - Patient declined       Current Medications (verified) Outpatient Encounter Medications as of 11/24/2022  Medication Sig   acetaminophen (TYLENOL) 500  MG tablet Take 1,000 mg by mouth every 6 (six) hours as needed for moderate pain.   diltiazem (CARDIZEM CD) 120 MG 24 hr capsule Take 1 capsule (120 mg total) by mouth daily.   diltiazem (CARDIZEM) 30 MG tablet Take 1 tablet (30 mg total) by mouth every 6 (six) hours as needed (breakthrough palpitations).   isosorbide mononitrate (IMDUR) 30 MG 24 hr tablet Take 0.5 tablets (15 mg total) by mouth daily.   losartan (COZAAR) 50 MG tablet Take 1 tablet (50 mg total) by mouth daily.   Multiple Vitamin (MULTIVITAMIN) tablet Take 1 tablet by mouth daily.   nitroGLYCERIN (NITROSTAT) 0.4 MG SL tablet Place 1 tablet (0.4 mg total) under the tongue every 5 (five) minutes as needed for chest pain.   rivaroxaban (XARELTO) 20 MG TABS tablet Take 1 tablet (20 mg total) by mouth daily with supper.   rosuvastatin (CRESTOR) 20 MG tablet Take 1 tablet (20 mg total) by mouth daily.   tamsulosin (FLOMAX) 0.4 MG CAPS capsule Take 1 capsule (0.4 mg total) by mouth at bedtime.   traZODone (DESYREL) 50 MG tablet TAKE 1 TABLET BY MOUTH EVERY DAY AT BEDTIME AS NEEDED   trolamine salicylate (ASPERCREME) 10 % cream Apply 1 application topically as needed for muscle pain.   Turmeric 1053 MG TABS Take 3 tablets by mouth daily.   No facility-administered encounter medications on file as of 11/24/2022.    Allergies (verified) Codeine  History: Past Medical History:  Diagnosis Date   BPH without obstruction/lower urinary tract symptoms    Coronary artery disease    cardiologist--- dr Shari Prows;  cath 05-18-2020  mininmal nonobstructive disease   ED (erectile dysfunction)    GERD (gastroesophageal reflux disease)    no meds   History of 2019 novel coronavirus disease (COVID-19) 2020   per pt asymptomatic   History of melanoma excision 06/2020   per pt localized area mid back   Hyperlipidemia, mixed    Hypertension    followed by pcp   Mild obstructive sleep apnea    followed by dr young;   study in epic  09-25-2020,  AHI 8.8/hr,  no cpap   OA (osteoarthritis)    Persistent atrial fibrillation (HCC) 03/2020   cardiologist-- dr h. Shari Prows---  on xarelto and bb;  work-up includes TTE, CTA, event monitor, and cardiac cath (results in epic),  pt ef 55-656 per cath and echo 55%   Pre-diabetes    Prostate cancer (HCC) 08/2009   urologist--- dr Laverle Patter--- first dx 07/ 2011,  Gleason 3+3,  active surveillance since;  last bx 06/ 2022  Gleason 7 scheduled for brachtherapy   Wears glasses    Past Surgical History:  Procedure Laterality Date   COLONOSCOPY     last one 2019 approx   ELBOW SURGERY Right 2007   LEFT HEART CATH AND CORONARY ANGIOGRAPHY N/A 05/18/2020   Procedure: LEFT HEART CATH AND CORONARY ANGIOGRAPHY;  Surgeon: [place]az[place], Peter M, MD;  Location: [facility] INVASIVE CV LAB;  Service: Cardiovascular;  Laterality: N/A;   PROSTATE BIOPSY N/A 01/21/2016   Procedure: BIOPSY TRANSRECTAL ULTRASONIC PROSTATE (TUBP);  Surgeon: Heloise Purpura, MD;  Location: [facility];  Service: Urology;  Laterality: N/A;   PROSTATE BIOPSY N/A 07/15/2018   Procedure: NEEDLE BIOPSY TRANSRECTAL ULTRASONIC PROSTATE (TUBP);  Surgeon: Heloise Purpura, MD;  Location: [facility];  Service: Urology;  Laterality: N/A;   PROSTATE BIOPSY N/A 08/13/2020   Procedure: BIOPSY TRANSRECTAL ULTRASONIC PROSTATE (TUBP);  Surgeon: Heloise Purpura, MD;  [facility];  Service: Urology;  Laterality: N/A;   RADIOACTIVE SEED IMPLANT N/A 04/11/2021   Procedure: RADIOACTIVE SEED IMPLANT/BRACHYTHERAPY IMPLANT WITH CYSTOSCOPY;  Surgeon: Heloise Purpura, MD;  Location: [facility];  Service: Urology;  Laterality: N/A;   SPACE OAR INSTILLATION N/A 04/11/2021   Procedure: SPACE OAR INSTILLATION;  Surgeon: Heloise Purpura, MD;  Location: [facility];  Service: Urology;  Laterality: N/A;   Family History  Problem Relation Age of Onset   Heart disease Mother    Hypertension Mother    Glaucoma Mother    Arthritis  Father    Heart disease Father    Hypertension Father    Bladder Cancer Father 18   Colon polyps Father    Dementia Brother    Lymphoma Cousin    Prostate cancer Cousin        mother's first cousin   Colon cancer Neg Hx    Esophageal cancer Neg Hx    Rectal cancer Neg Hx    Stomach cancer Neg Hx    Breast cancer Neg Hx    Social History   Socioeconomic History   Marital status: Married    Spouse name: Not on file   Number of children: 1   Years of education: Not on file   Highest education level: Not on file  Occupational History   Not on file  Tobacco Use   Smoking status: Former  Current packs/day: 0.00    Average packs/day: 2.0 packs/day for 35.0 years (70.0 ttl pk-yrs)    Types: Cigarettes    Start date: 71    Quit date: 2009    Years since quitting: 15.7   Smokeless tobacco: Never  Vaping Use   Vaping status: Never Used  Substance and Sexual Activity   Alcohol use: Yes    Alcohol/week: 42.0 standard drinks of alcohol    Types: 42 Cans of beer per week    Comment: 6pk beer per day  (12 oz each)   Drug use: Never   Sexual activity: Not on file  Other Topics Concern   Not on file  Social History Narrative   Lives in [place] with wife.   Retired.  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    Two Children who both live locally.       He likes to sleep.    Social Determinants of Health   Financial Resource Strain: Low Risk  (11/24/2022)   Overall Financial Resource Strain (CARDIA)    Difficulty of Paying Living Expenses: Not hard at all  Food Insecurity: No Food Insecurity (11/24/2022)   Hunger Vital Sign    Worried About Running Out of Food in the Last Year: Never true    Ran Out of Food in the Last Year: Never true  Transportation Needs: No Transportation Needs (11/24/2022)   PRAPARE - Administrator, Civil Service (Medical): No    Lack of Transportation (Non-Medical): No  Physical Activity:  Sufficiently Active (11/24/2022)   Exercise Vital Sign    Days of Exercise per Week: 7 days    Minutes of Exercise per Session: 30 min  Stress: No Stress Concern Present (11/24/2022)   [employer] of Occupational Health - Occupational Stress Questionnaire    Feeling of Stress : Not at all  Social Connections: Moderately Isolated (11/24/2022)   Social Connection and Isolation Panel [NHANES]    Frequency of Communication with Friends and Family: More than three times a week    Frequency of Social Gatherings with Friends and Family: More than three times a week    Attends Religious Services: Never    Database administrator or Organizations: No    Attends Engineer, structural: Never    Marital Status: Married    Tobacco Counseling Counseling given: Not Answered   Clinical Intake:  Pre-visit preparation completed: Yes  Pain : No/denies pain     BMI - recorded: 27.26 Nutritional Status: BMI 25 -29 Overweight Nutritional Risks: None Diabetes: No  How often do you need to have someone help you when you read instructions, pamphlets, or other written materials from your doctor or pharmacy?: 1 - Never  Interpreter Needed?: No  Information entered by :: Theresa Mulligan LPN   Activities of Daily Living    11/24/2022    9:03 AM  In your present state of health, do you have any difficulty performing the following activities:  Hearing? 0  Vision? 0  Difficulty concentrating or making decisions? 0  Walking or climbing stairs? 0  Dressing or bathing? 0  Doing errands, shopping? 0  Preparing Food and eating ? N  Using the Toilet? N  In the past six months, have you accidently leaked urine? Y  Comment Wears breif. Followed by Urologist  Do you have problems with loss of bowel control? N  Managing your Medications? N  Managing  your Finances? N  Housekeeping or managing your Housekeeping? N    Patient Care Team: Shirline Frees, NP as PCP - General (Family  Medicine) Meriam Sprague, MD as PCP - Cardiology (Cardiology) Hillis Range, MD (Inactive) as PCP - Electrophysiology (Clinical Cardiac Electrophysiology) [facility], [facility] [place], Milas Kocher, [facility] (Pharmacist)  Indicate any recent Medical Services you may have received from other than [facility] providers in the past year (date may be approximate).     Assessment:   This is a routine wellness examination for James Edwards.  Hearing/Vision screen Hearing Screening - Comments:: Denies hearing difficulties   Vision Screening - Comments:: Wears rx glasses - up to date with routine eye exams with  [facility]   Goals Addressed               This Visit's Progress     Stay Alive (pt-stated)         Depression Screen    11/24/2022    9:00 AM 12/17/2021    9:52 AM 11/18/2021    8:49 AM 11/15/2020   10:23 AM 10/26/2019    9:09 AM 05/24/2019    8:19 AM 05/24/2019    8:18 AM  PHQ 2/9 Scores  PHQ - 2 Score 0 6 0 0 0 0 0  PHQ- 9 Score  11   0      Fall Risk    11/24/2022    9:04 AM 12/17/2021    9:53 AM 11/18/2021    8:49 AM 11/15/2020   10:25 AM 10/26/2019    9:08 AM  Fall Risk   Falls in the past year? 0 1 0 0 0  Number falls in past yr: 0 0 0 0 0  Injury with Fall? 0 0 0 0 0  Risk for fall due to : No Fall Risks No Fall Risks Medication side effect  Medication side effect  Follow up Falls prevention discussed Falls evaluation completed Falls prevention discussed;Education provided;Falls evaluation completed Falls evaluation completed Falls evaluation completed;Falls prevention discussed    MEDICARE RISK AT HOME: Medicare Risk at Home Any stairs in or around the home?: Yes If so, are there any without handrails?: No Home free of loose throw rugs in walkways, pet beds, electrical cords, etc?: Yes Adequate lighting in your home to reduce risk of falls?: Yes Life alert?: No Use of a cane, walker or w/c?: No Grab bars in the bathroom?: Yes Shower chair or bench in shower?:  No Elevated toilet seat or a handicapped toilet?: No  TIMED UP AND GO:  Was the test performed?  No    Cognitive Function:        11/24/2022    9:05 AM 11/18/2021    8:51 AM 10/26/2019    9:11 AM  6CIT Screen  What Year? 0 points 0 points 0 points  What month? 0 points 0 points 0 points  What time? 0 points 3 points 0 points  Count back from 20 0 points 0 points 0 points  Months in reverse 0 points 4 points 2 points  Repeat phrase 0 points 0 points 0 points  Total Score 0 points 7 points 2 points    Immunizations Immunization History  Administered Date(s) Administered   Fluad Quad(high Dose 65+) 01/01/2022   Influenza, High Dose Seasonal PF 10/18/2014, 10/24/2015, 12/05/2017, 12/07/2019, 12/17/2020   Moderna Covid-19 Vaccine Bivalent Booster 48yrs & up 12/17/2020   Moderna SARS-COV2 Booster Vaccination 12/13/2019   Moderna Sars-Covid-2 Vaccination 04/02/2019, 04/30/2019  Pfizer(Comirnaty)Fall Seasonal Vaccine 12 years and older 01/01/2022   Pneumococcal Conjugate-13 10/18/2014   Pneumococcal Polysaccharide-23 10/24/2015    TDAP status: Due, Education has been provided regarding the importance of this vaccine. Advised may receive this vaccine at local pharmacy or Health Dept. Aware to provide a copy of the vaccination record if obtained from local pharmacy or Health Dept. Verbalized acceptance and understanding.  Flu Vaccine status: Due, Education has been provided regarding the importance of this vaccine. Advised may receive this vaccine at local pharmacy or Health Dept. Aware to provide a copy of the vaccination record if obtained from local pharmacy or Health Dept. Verbalized acceptance and understanding.  Pneumococcal vaccine status: Up to date  Covid-19 vaccine status: Declined, Education has been provided regarding the importance of this vaccine but patient still declined. Advised may receive this vaccine at local pharmacy or Health Dept.or vaccine clinic. Aware to  provide a copy of the vaccination record if obtained from local pharmacy or Health Dept. Verbalized acceptance and understanding.  Qualifies for Shingles Vaccine? Yes   Zostavax completed No   Shingrix Completed?: No.    Education has been provided regarding the importance of this vaccine. Patient has been advised to call insurance company to determine out of pocket expense if they have not yet received this vaccine. Advised may also receive vaccine at local pharmacy or Health Dept. Verbalized acceptance and understanding.  Screening Tests Health Maintenance  Topic Date Due   Diabetic kidney evaluation - Urine ACR  Never done   DTaP/Tdap/Td (1 - Tdap) Never done   Zoster Vaccines- Shingrix (1 of 2) Never done   HEMOGLOBIN A1C  07/26/2022   INFLUENZA VACCINE  09/18/2022   Colonoscopy  10/08/2022   COVID-19 Vaccine (5 - 2023-24 season) 10/19/2022   Diabetic kidney evaluation - eGFR measurement  01/25/2023   Medicare Annual Wellness (AWV)  11/24/2023   Pneumonia Vaccine 94+ Years old  Completed   Hepatitis C Screening  Completed   HPV VACCINES  Aged Out   Lung Cancer Screening  Discontinued   FOOT EXAM  Discontinued   OPHTHALMOLOGY EXAM  Discontinued    Health Maintenance  Health Maintenance Due  Topic Date Due   Diabetic kidney evaluation - Urine ACR  Never done   DTaP/Tdap/Td (1 - Tdap) Never done   Zoster Vaccines- Shingrix (1 of 2) Never done   HEMOGLOBIN A1C  07/26/2022   INFLUENZA VACCINE  09/18/2022   Colonoscopy  10/08/2022   COVID-19 Vaccine (5 - 2023-24 season) 10/19/2022    Colorectal cancer screening: Referral to GI placed 11/24/22. Pt aware the office will call re: appt.    Additional Screening:  Hepatitis C Screening: does qualify; Completed 03/05/18  Vision Screening: Recommended annual ophthalmology exams for early detection of glaucoma and other disorders of the eye. Is the patient up to date with their annual eye exam?  Yes  Who is the provider or what is  the name of the office in which the patient attends annual eye exams? [facility] If pt is not established with a provider, would they like to be referred to a provider to establish care? No .   Dental Screening: Recommended annual dental exams for proper oral hygiene   Community Resource Referral / Chronic Care Management:  CRR required this visit?  No   CCM required this visit?  No     Plan:     I have personally reviewed and noted the following in the patient's chart:  Medical and social history Use of alcohol, tobacco or illicit drugs  Current medications and supplements including opioid prescriptions. Patient is not currently taking opioid prescriptions. Functional ability and status Nutritional status Physical activity Advanced directives List of other physicians Hospitalizations, surgeries, and ER visits in previous 12 months Vitals Screenings to include cognitive, depression, and falls Referrals and appointments  In addition, I have reviewed and discussed with patient certain preventive protocols, quality metrics, and best practice recommendations. A written personalized care plan for preventive services as well as general preventive health recommendations were provided to patient.     Tillie Rung, LPN   11/19/5850   After Visit Summary: (MyChart) Due to this being a telephonic visit, the after visit summary with patients personalized plan was offered to patient via MyChart   Nurse Notes: None

## 2022-11-25 ENCOUNTER — Telehealth: Payer: Self-pay | Admitting: Cardiology

## 2022-11-25 DIAGNOSIS — I4819 Other persistent atrial fibrillation: Secondary | ICD-10-CM

## 2022-11-25 MED ORDER — RIVAROXABAN 20 MG PO TABS: 20.0000 mg | ORAL_TABLET | Freq: Every day | ORAL | 1 refills | Status: DC

## 2022-11-25 NOTE — Telephone Encounter (Signed)
Prescription refill request for Xarelto received.  Indication: AF Last office visit: 08/12/22  Jon Billings MD Weight: 45WU Age: 72 Scr: 1.02 on 01/24/22  Epic CrCl: 81.20  Based on above findings Xarelto 20mg  daily is the appropriate dose.  Refill approved.

## 2022-11-25 NOTE — Telephone Encounter (Signed)
*  STAT* If patient is at the pharmacy, call can be transferred to refill team.   1. Which medications need to be refilled? (please list name of each medication and dose if known)  rivaroxaban (XARELTO) 20 MG TABS tablet    2. Would you like to learn more about the convenience, safety, & potential cost savings by using the [facility]? No   3. Are you open to using the [facility] (Type [facility]. No    4. Which pharmacy/location (including street and city if local pharmacy) is medication to be sent to? [facility] [address] [place], [place] - [address]    5. Do they need a 30 day or 90 day supply? 90 day

## 2022-12-12 DIAGNOSIS — D2262 Melanocytic nevi of left upper limb, including shoulder: Secondary | ICD-10-CM | POA: Diagnosis not present

## 2022-12-12 DIAGNOSIS — Z85828 Personal history of other malignant neoplasm of skin: Secondary | ICD-10-CM | POA: Diagnosis not present

## 2022-12-12 DIAGNOSIS — L814 Other melanin hyperpigmentation: Secondary | ICD-10-CM | POA: Diagnosis not present

## 2022-12-12 DIAGNOSIS — D1801 Hemangioma of skin and subcutaneous tissue: Secondary | ICD-10-CM | POA: Diagnosis not present

## 2022-12-12 DIAGNOSIS — Z85820 Personal history of malignant melanoma of skin: Secondary | ICD-10-CM | POA: Diagnosis not present

## 2022-12-12 DIAGNOSIS — L820 Inflamed seborrheic keratosis: Secondary | ICD-10-CM | POA: Diagnosis not present

## 2022-12-12 DIAGNOSIS — L570 Actinic keratosis: Secondary | ICD-10-CM | POA: Diagnosis not present

## 2022-12-12 DIAGNOSIS — L821 Other seborrheic keratosis: Secondary | ICD-10-CM | POA: Diagnosis not present

## 2022-12-16 ENCOUNTER — Ambulatory Visit (INDEPENDENT_AMBULATORY_CARE_PROVIDER_SITE_OTHER): Payer: Medicare Other | Admitting: Adult Health

## 2022-12-16 ENCOUNTER — Encounter: Payer: Self-pay | Admitting: Adult Health

## 2022-12-16 VITALS — BP 120/82 | HR 98 | Temp 98.3°F | Ht 70.0 in | Wt 193.0 lb

## 2022-12-16 DIAGNOSIS — Z23 Encounter for immunization: Secondary | ICD-10-CM | POA: Diagnosis not present

## 2022-12-16 DIAGNOSIS — F339 Major depressive disorder, recurrent, unspecified: Secondary | ICD-10-CM

## 2022-12-16 DIAGNOSIS — R1310 Dysphagia, unspecified: Secondary | ICD-10-CM

## 2022-12-16 MED ORDER — TRAZODONE HCL 100 MG PO TABS
100.0000 mg | ORAL_TABLET | Freq: Every evening | ORAL | 1 refills | Status: DC | PRN
Start: 2022-12-16 — End: 2023-05-19

## 2022-12-16 NOTE — Progress Notes (Signed)
Subjective:    Patient ID: James Edwards, male    DOB: 27-Jun-1950, 72 y.o.   MRN: [id number]  HPI 72 year old male who  has a past medical history of BPH without obstruction/lower urinary tract symptoms, Coronary artery disease, ED (erectile dysfunction), GERD (gastroesophageal reflux disease), History of 2019 novel coronavirus disease (COVID-19) (2020), History of melanoma excision (06/2020), Hyperlipidemia, mixed, Hypertension, Mild obstructive sleep apnea, OA (osteoarthritis), Persistent atrial fibrillation (HCC) (03/2020), Pre-diabetes, Prostate cancer (HCC) (08/2009), and Wears glasses.  He presents to the office today for " trouble swallowing". His symptoms have been present for 6 months He does report a history of similar symptoms but this time it seems to be worse.  He reports that he does not have any trouble swallowing water but foods such as pork, chicken, and white bread will get stuck in his throat when he swallows.  From time to time he will have to stick his fingers down his throat in order to make self vomit so that he does not choke.  Additionally, he would like to go on an antidepressant due to having a lot of stress" which is making him depressed.  He is taking tramadol 50 mg nightly for insomnia and this works well for his insomnia.     Review of Systems See HPI   Past Medical History:  Diagnosis Date   BPH without obstruction/lower urinary tract symptoms    Coronary artery disease    cardiologist--- dr Shari Prows;  cath 05-18-2020  mininmal nonobstructive disease   ED (erectile dysfunction)    GERD (gastroesophageal reflux disease)    no meds   History of 2019 novel coronavirus disease (COVID-19) 2020   per pt asymptomatic   History of melanoma excision 06/2020   per pt localized area mid back   Hyperlipidemia, mixed    Hypertension    followed by pcp   Mild obstructive sleep apnea    followed by dr young;   study in epic 09-25-2020,  AHI 8.8/hr,  no cpap    OA (osteoarthritis)    Persistent atrial fibrillation (HCC) 03/2020   cardiologist-- dr h. Shari Prows---  on xarelto and bb;  work-up includes TTE, CTA, event monitor, and cardiac cath (results in epic),  pt ef 55-656 per cath and echo 55%   Pre-diabetes    Prostate cancer (HCC) 08/2009   urologist--- dr Laverle Patter--- first dx 07/ 2011,  Gleason 3+3,  active surveillance since;  last bx 06/ 2022  Gleason 7 scheduled for brachtherapy   Wears glasses     Social History   Socioeconomic History   Marital status: Married    Spouse name: Not on file   Number of children: 1   Years of education: Not on file   Highest education level: Bachelor's degree (e.g., BA, AB, BS)  Occupational History   Not on file  Tobacco Use   Smoking status: Former    Current packs/day: 0.00    Average packs/day: 2.0 packs/day for 35.0 years (70.0 ttl pk-yrs)    Types: Cigarettes    Start date: 43    Quit date: 2009    Years since quitting: 15.8   Smokeless tobacco: Never  Vaping Use   Vaping status: Never Used  Substance and Sexual Activity   Alcohol use: Yes    Alcohol/week: 42.0 standard drinks of alcohol    Types: 42 Cans of beer per week    Comment: 6pk beer per day  (12 oz each)  Drug use: Never   Sexual activity: Not on file  Other Topics Concern   Not on file  Social History Narrative   Lives in [place] with wife.   Retired.  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    Two Children who both live locally.       He likes to sleep.    Social Determinants of Health   Financial Resource Strain: Low Risk  (12/15/2022)   Overall Financial Resource Strain (CARDIA)    Difficulty of Paying Living Expenses: Not hard at all  Food Insecurity: No Food Insecurity (12/15/2022)   Hunger Vital Sign    Worried About Running Out of Food in the Last Year: Never true    Ran Out of Food in the Last Year: Never true  Transportation Needs: No Transportation  Needs (12/15/2022)   PRAPARE - Administrator, Civil Service (Medical): No    Lack of Transportation (Non-Medical): No  Physical Activity: Sufficiently Active (12/15/2022)   Exercise Vital Sign    Days of Exercise per Week: 7 days    Minutes of Exercise per Session: 30 min  Stress: Stress Concern Present (12/15/2022)   [employer] of Occupational Health - Occupational Stress Questionnaire    Feeling of Stress : To some extent  Social Connections: Moderately Isolated (12/15/2022)   Social Connection and Isolation Panel [NHANES]    Frequency of Communication with Friends and Family: Once a week    Frequency of Social Gatherings with Friends and Family: More than three times a week    Attends Religious Services: Never    Database administrator or Organizations: No    Attends Banker Meetings: Never    Marital Status: Married  Catering manager Violence: Not At Risk (11/24/2022)   Humiliation, Afraid, Rape, and Kick questionnaire    Fear of Current or Ex-Partner: No    Emotionally Abused: No    Physically Abused: No    Sexually Abused: No    Past Surgical History:  Procedure Laterality Date   COLONOSCOPY     last one 2019 approx   ELBOW SURGERY Right 2007   LEFT HEART CATH AND CORONARY ANGIOGRAPHY N/A 05/18/2020   Procedure: LEFT HEART CATH AND CORONARY ANGIOGRAPHY;  Surgeon: Swaziland, Peter M, MD;  Location: [facility];  Service: Cardiovascular;  Laterality: N/A;   PROSTATE BIOPSY N/A 01/21/2016   Procedure: BIOPSY TRANSRECTAL ULTRASONIC PROSTATE (TUBP);  Surgeon: Heloise Purpura, MD;  Location: [facility];  Service: Urology;  Laterality: N/A;   PROSTATE BIOPSY N/A 07/15/2018   Procedure: NEEDLE BIOPSY TRANSRECTAL ULTRASONIC PROSTATE (TUBP);  Surgeon: Heloise Purpura, MD;  Location: [facility];  Service: Urology;  Laterality: N/A;   PROSTATE BIOPSY N/A 08/13/2020   Procedure: BIOPSY TRANSRECTAL ULTRASONIC PROSTATE (TUBP);  Surgeon: Heloise Purpura, MD;   Location: [facility];  Service: Urology;  Laterality: N/A;   RADIOACTIVE SEED IMPLANT N/A 04/11/2021   Procedure: RADIOACTIVE SEED IMPLANT/BRACHYTHERAPY IMPLANT WITH CYSTOSCOPY;  Surgeon: Heloise Purpura, MD;  Location: [facility];  Service: Urology;  Laterality: N/A;   SPACE OAR INSTILLATION N/A 04/11/2021   Procedure: SPACE OAR INSTILLATION;  Surgeon: Heloise Purpura, MD;  Location: [facility];  Service: Urology;  Laterality: N/A;    Family History  Problem Relation Age of Onset   Heart disease Mother    Hypertension Mother    Glaucoma Mother    Arthritis Father  Heart disease Father    Hypertension Father    Bladder Cancer Father 50   Colon polyps Father    Dementia Brother    Lymphoma Cousin    Prostate cancer Cousin        mother's first cousin   Colon cancer Neg Hx    Esophageal cancer Neg Hx    Rectal cancer Neg Hx    Stomach cancer Neg Hx    Breast cancer Neg Hx     Allergies  Allergen Reactions   Codeine Nausea And Vomiting    Current Outpatient Medications on File Prior to Visit  Medication Sig Dispense Refill   acetaminophen (TYLENOL) 500 MG tablet Take 1,000 mg by mouth every 6 (six) hours as needed for moderate pain.     diltiazem (CARDIZEM CD) 120 MG 24 hr capsule Take 1 capsule (120 mg total) by mouth daily. 90 capsule 1   diltiazem (CARDIZEM) 30 MG tablet Take 1 tablet (30 mg total) by mouth every 6 (six) hours as needed (breakthrough palpitations). 120 tablet 2   isosorbide mononitrate (IMDUR) 30 MG 24 hr tablet Take 0.5 tablets (15 mg total) by mouth daily. 45 tablet 3   losartan (COZAAR) 50 MG tablet Take 1 tablet (50 mg total) by mouth daily. 90 tablet 1   Multiple Vitamin (MULTIVITAMIN) tablet Take 1 tablet by mouth daily.     rivaroxaban (XARELTO) 20 MG TABS tablet Take 1 tablet (20 mg total) by mouth daily with supper. 90 tablet 1   rosuvastatin (CRESTOR) 20 MG tablet Take 1 tablet (20 mg total) by mouth  daily. 90 tablet 3   solifenacin (VESICARE) 5 MG tablet Take 5 mg by mouth daily.     tamsulosin (FLOMAX) 0.4 MG CAPS capsule Take 1 capsule (0.4 mg total) by mouth at bedtime. 30 capsule 0   traZODone (DESYREL) 50 MG tablet TAKE 1 TABLET BY MOUTH EVERY DAY AT BEDTIME AS NEEDED 90 tablet 1   trolamine salicylate (ASPERCREME) 10 % cream Apply 1 application topically as needed for muscle pain.     Turmeric 1053 MG TABS Take 3 tablets by mouth daily.     nitroGLYCERIN (NITROSTAT) 0.4 MG SL tablet Place 1 tablet (0.4 mg total) under the tongue every 5 (five) minutes as needed for chest pain. 30 tablet 3   No current facility-administered medications on file prior to visit.    BP 120/82   Pulse 98   Temp 98.3 F (36.8 C) (Oral)   Ht 5\' 10"  (1.778 m)   Wt 193 lb (87.5 kg)   SpO2 97%   BMI 27.69 kg/m       Objective:   Physical Exam Vitals and nursing note reviewed.  Constitutional:      Appearance: Normal appearance.  Cardiovascular:     Rate and Rhythm: Normal rate and regular rhythm.     Pulses: Normal pulses.     Heart sounds: Normal heart sounds.  Pulmonary:     Effort: Pulmonary effort is normal.     Breath sounds: Normal breath sounds.  Musculoskeletal:        General: Normal range of motion.  Skin:    General: Skin is warm and dry.  Neurological:     General: No focal deficit present.     Mental Status: He is alert and oriented to person, place, and time.  Psychiatric:        Mood and Affect: Mood normal.        Behavior: Behavior normal.  Thought Content: Thought content normal.        Judgment: Judgment normal.       Assessment & Plan:  1. Dysphagia, unspecified type - Discussed taking small bites and chewing food well. Drink plenty of water with meals.  - Ambulatory referral to Gastroenterology  2. Depression, recurrent (HCC) - Will increase his Trazodone to 100 mg at bedtime - Follow up if not improving in the next 2-4 weeks  - traZODone (DESYREL)  100 MG tablet; Take 1 tablet (100 mg total) by mouth at bedtime as needed for sleep.  Dispense: 90 tablet; Refill: 1  3. Need for influenza vaccination  - Flu Vaccine Trivalent High Dose (Fluad)  Shirline Frees, NP  Time spent with patient today was 31 minutes which consisted of chart review, discussing  Dysphagia and Depression,work up, treatment answering questions and documentation.

## 2022-12-17 DIAGNOSIS — C61 Malignant neoplasm of prostate: Secondary | ICD-10-CM | POA: Diagnosis not present

## 2022-12-23 ENCOUNTER — Other Ambulatory Visit: Payer: Self-pay

## 2022-12-23 MED ORDER — DILTIAZEM HCL ER COATED BEADS 120 MG PO CP24: 120.0000 mg | ORAL_CAPSULE | Freq: Every day | ORAL | 0 refills | Status: DC

## 2022-12-24 DIAGNOSIS — R3915 Urgency of urination: Secondary | ICD-10-CM | POA: Diagnosis not present

## 2022-12-24 DIAGNOSIS — C61 Malignant neoplasm of prostate: Secondary | ICD-10-CM | POA: Diagnosis not present

## 2022-12-24 DIAGNOSIS — N401 Enlarged prostate with lower urinary tract symptoms: Secondary | ICD-10-CM | POA: Diagnosis not present

## 2022-12-24 DIAGNOSIS — R8271 Bacteriuria: Secondary | ICD-10-CM | POA: Diagnosis not present

## 2023-01-14 ENCOUNTER — Ambulatory Visit (HOSPITAL_COMMUNITY)
Admission: EM | Admit: 2023-01-14 | Discharge: 2023-01-14 | Disposition: A | Discharge status: Home / Self Care | Payer: Medicare Other | Attending: Emergency Medicine | Admitting: Emergency Medicine

## 2023-01-14 ENCOUNTER — Encounter (HOSPITAL_COMMUNITY)
Admission: EM | Disposition: A | Discharge status: Home / Self Care | Payer: Self-pay | Source: Home / Self Care | Attending: Emergency Medicine

## 2023-01-14 ENCOUNTER — Other Ambulatory Visit: Payer: Self-pay

## 2023-01-14 ENCOUNTER — Encounter (HOSPITAL_COMMUNITY): Payer: Self-pay

## 2023-01-14 ENCOUNTER — Emergency Department (HOSPITAL_COMMUNITY): Payer: Medicare Other | Admitting: Anesthesiology

## 2023-01-14 ENCOUNTER — Emergency Department (HOSPITAL_COMMUNITY): Payer: Medicare Other

## 2023-01-14 ENCOUNTER — Emergency Department (HOSPITAL_BASED_OUTPATIENT_CLINIC_OR_DEPARTMENT_OTHER): Payer: Medicare Other | Admitting: Anesthesiology

## 2023-01-14 DIAGNOSIS — S62625A Displaced fracture of medial phalanx of left ring finger, initial encounter for closed fracture: Secondary | ICD-10-CM | POA: Diagnosis not present

## 2023-01-14 DIAGNOSIS — S68113A Complete traumatic metacarpophalangeal amputation of left middle finger, initial encounter: Secondary | ICD-10-CM | POA: Diagnosis not present

## 2023-01-14 DIAGNOSIS — S66125A Laceration of flexor muscle, fascia and tendon of left ring finger at wrist and hand level, initial encounter: Secondary | ICD-10-CM | POA: Diagnosis not present

## 2023-01-14 DIAGNOSIS — Z23 Encounter for immunization: Secondary | ICD-10-CM | POA: Insufficient documentation

## 2023-01-14 DIAGNOSIS — W278XXA Contact with other nonpowered hand tool, initial encounter: Secondary | ICD-10-CM | POA: Diagnosis not present

## 2023-01-14 DIAGNOSIS — I2510 Atherosclerotic heart disease of native coronary artery without angina pectoris: Secondary | ICD-10-CM | POA: Diagnosis not present

## 2023-01-14 DIAGNOSIS — I4819 Other persistent atrial fibrillation: Secondary | ICD-10-CM | POA: Diagnosis not present

## 2023-01-14 DIAGNOSIS — G4733 Obstructive sleep apnea (adult) (pediatric): Secondary | ICD-10-CM | POA: Insufficient documentation

## 2023-01-14 DIAGNOSIS — Z87891 Personal history of nicotine dependence: Secondary | ICD-10-CM | POA: Diagnosis not present

## 2023-01-14 DIAGNOSIS — S61422A Laceration with foreign body of left hand, initial encounter: Secondary | ICD-10-CM | POA: Diagnosis not present

## 2023-01-14 DIAGNOSIS — S62603A Fracture of unspecified phalanx of left middle finger, initial encounter for closed fracture: Secondary | ICD-10-CM | POA: Diagnosis not present

## 2023-01-14 DIAGNOSIS — S68115A Complete traumatic metacarpophalangeal amputation of left ring finger, initial encounter: Secondary | ICD-10-CM | POA: Diagnosis not present

## 2023-01-14 DIAGNOSIS — S61219A Laceration without foreign body of unspecified finger without damage to nail, initial encounter: Secondary | ICD-10-CM

## 2023-01-14 DIAGNOSIS — I10 Essential (primary) hypertension: Secondary | ICD-10-CM | POA: Insufficient documentation

## 2023-01-14 DIAGNOSIS — S52602A Unspecified fracture of lower end of left ulna, initial encounter for closed fracture: Secondary | ICD-10-CM | POA: Diagnosis not present

## 2023-01-14 DIAGNOSIS — S62633A Displaced fracture of distal phalanx of left middle finger, initial encounter for closed fracture: Secondary | ICD-10-CM | POA: Diagnosis not present

## 2023-01-14 DIAGNOSIS — S68623A Partial traumatic transphalangeal amputation of left middle finger, initial encounter: Secondary | ICD-10-CM | POA: Diagnosis not present

## 2023-01-14 DIAGNOSIS — S62625B Displaced fracture of medial phalanx of left ring finger, initial encounter for open fracture: Secondary | ICD-10-CM | POA: Insufficient documentation

## 2023-01-14 DIAGNOSIS — M1812 Unilateral primary osteoarthritis of first carpometacarpal joint, left hand: Secondary | ICD-10-CM | POA: Diagnosis not present

## 2023-01-14 HISTORY — PX: OPEN REDUCTION INTERNAL FIXATION (ORIF) DISTAL PHALANX: SHX6236

## 2023-01-14 LAB — CBC WITH DIFFERENTIAL/PLATELET
Abs Immature Granulocytes: 0.04 10*3/uL (ref 0.00–0.07)
Basophils Absolute: 0 10*3/uL (ref 0.0–0.1)
Basophils Relative: 0 %
Eosinophils Absolute: 0.2 10*3/uL (ref 0.0–0.5)
Eosinophils Relative: 2 %
HCT: 40.7 % (ref 39.0–52.0)
Hemoglobin: 13.7 g/dL (ref 13.0–17.0)
Immature Granulocytes: 1 %
Lymphocytes Relative: 18 %
Lymphs Abs: 1.5 10*3/uL (ref 0.7–4.0)
MCH: 32.5 pg (ref 26.0–34.0)
MCHC: 33.7 g/dL (ref 30.0–36.0)
MCV: 96.4 fL (ref 80.0–100.0)
Monocytes Absolute: 0.8 10*3/uL (ref 0.1–1.0)
Monocytes Relative: 9 %
Neutro Abs: 6.1 10*3/uL (ref 1.7–7.7)
Neutrophils Relative %: 70 %
Platelets: 252 10*3/uL (ref 150–400)
RBC: 4.22 MIL/uL (ref 4.22–5.81)
RDW: 13 % (ref 11.5–15.5)
WBC: 8.6 10*3/uL (ref 4.0–10.5)
nRBC: 0 % (ref 0.0–0.2)

## 2023-01-14 LAB — BASIC METABOLIC PANEL
Anion gap: 10 (ref 5–15)
BUN: 10 mg/dL (ref 8–23)
CO2: 21 mmol/L — ABNORMAL LOW (ref 22–32)
Calcium: 10 mg/dL (ref 8.9–10.3)
Chloride: 104 mmol/L (ref 98–111)
Creatinine, Ser: 1 mg/dL (ref 0.61–1.24)
GFR, Estimated: >60 mL/min (ref >60)
Glucose, Bld: 124 mg/dL — ABNORMAL HIGH (ref 70–99)
Potassium: 3.9 mmol/L (ref 3.5–5.1)
Sodium: 135 mmol/L (ref 135–145)

## 2023-01-14 SURGERY — OPEN REDUCTION INTERNAL FIXATION (ORIF) DISTAL PHALANX
Anesthesia: General | Laterality: Left

## 2023-01-14 MED ORDER — EPHEDRINE 5 MG/ML INJ
INTRAVENOUS | Status: AC
  Filled 2023-01-14: qty 5

## 2023-01-14 MED ORDER — ROCURONIUM BROMIDE 10 MG/ML (PF) SYRINGE
PREFILLED_SYRINGE | INTRAVENOUS | Status: AC
  Filled 2023-01-14: qty 10

## 2023-01-14 MED ORDER — ONDANSETRON HCL 4 MG/2ML IJ SOLN: 4.0000 mg | Freq: Once | INTRAMUSCULAR | Status: DC | PRN

## 2023-01-14 MED ORDER — BUPIVACAINE HCL 0.25 % IJ SOLN
30.0000 mL | Freq: Once | INTRAMUSCULAR | Status: DC
  Filled 2023-01-14: qty 30

## 2023-01-14 MED ORDER — ACETAMINOPHEN 10 MG/ML IV SOLN
INTRAVENOUS | Status: AC
  Filled 2023-01-14: qty 100

## 2023-01-14 MED ORDER — OXYCODONE HCL 5 MG/5ML PO SOLN: 5.0000 mg | Freq: Once | ORAL | Status: AC | PRN

## 2023-01-14 MED ORDER — PHENYLEPHRINE HCL (PRESSORS) 10 MG/ML IV SOLN
INTRAVENOUS | Status: AC
  Filled 2023-01-14: qty 1

## 2023-01-14 MED ORDER — PROPOFOL 10 MG/ML IV BOLUS
INTRAVENOUS | Status: DC | PRN
  Administered 2023-01-14: 150 mg via INTRAVENOUS

## 2023-01-14 MED ORDER — OXYCODONE HCL 5 MG PO TABS: 5.0000 mg | ORAL_TABLET | Freq: Three times a day (TID) | ORAL | 0 refills | Status: DC | PRN

## 2023-01-14 MED ORDER — EPHEDRINE SULFATE-NACL 50-0.9 MG/10ML-% IV SOSY
PREFILLED_SYRINGE | INTRAVENOUS | Status: DC | PRN
  Administered 2023-01-14: 5 mg via INTRAVENOUS

## 2023-01-14 MED ORDER — DEXAMETHASONE SODIUM PHOSPHATE 10 MG/ML IJ SOLN
INTRAMUSCULAR | Status: AC
  Filled 2023-01-14: qty 1

## 2023-01-14 MED ORDER — PHENYLEPHRINE HCL-NACL 20-0.9 MG/250ML-% IV SOLN
INTRAVENOUS | Status: DC | PRN
  Administered 2023-01-14: 40 ug/min via INTRAVENOUS

## 2023-01-14 MED ORDER — MEPERIDINE HCL 50 MG/ML IJ SOLN: 6.2500 mg | INTRAMUSCULAR | Status: DC | PRN

## 2023-01-14 MED ORDER — ONDANSETRON HCL 4 MG/2ML IJ SOLN
INTRAMUSCULAR | Status: DC | PRN
  Administered 2023-01-14: 4 mg via INTRAVENOUS

## 2023-01-14 MED ORDER — DEXAMETHASONE SODIUM PHOSPHATE 4 MG/ML IJ SOLN
INTRAMUSCULAR | Status: DC | PRN
  Administered 2023-01-14: 5 mg via INTRAVENOUS

## 2023-01-14 MED ORDER — PHENYLEPHRINE 80 MCG/ML (10ML) SYRINGE FOR IV PUSH (FOR BLOOD PRESSURE SUPPORT)
PREFILLED_SYRINGE | INTRAVENOUS | Status: DC | PRN
  Administered 2023-01-14: 80 ug via INTRAVENOUS
  Administered 2023-01-14: 160 ug via INTRAVENOUS
  Administered 2023-01-14: 80 ug via INTRAVENOUS

## 2023-01-14 MED ORDER — MIDAZOLAM HCL 2 MG/2ML IJ SOLN
INTRAMUSCULAR | Status: AC
Start: 2023-01-14 — End: ?
  Filled 2023-01-14: qty 2

## 2023-01-14 MED ORDER — LACTATED RINGERS IV SOLN: INTRAVENOUS | Status: DC

## 2023-01-14 MED ORDER — CEFAZOLIN SODIUM-DEXTROSE 2-4 GM/100ML-% IV SOLN
2.0000 g | Freq: Once | INTRAVENOUS | Status: AC
  Administered 2023-01-14: 2 g via INTRAVENOUS
  Filled 2023-01-14: qty 100

## 2023-01-14 MED ORDER — LIDOCAINE-EPINEPHRINE (PF) 1 %-1:200000 IJ SOLN
INTRAMUSCULAR | Status: AC
  Filled 2023-01-14: qty 30

## 2023-01-14 MED ORDER — FENTANYL CITRATE PF 50 MCG/ML IJ SOSY: 25.0000 ug | PREFILLED_SYRINGE | INTRAMUSCULAR | Status: DC | PRN

## 2023-01-14 MED ORDER — OXYCODONE HCL 5 MG PO TABS
ORAL_TABLET | ORAL | Status: AC
  Filled 2023-01-14: qty 1

## 2023-01-14 MED ORDER — LIDOCAINE HCL 2 % IJ SOLN
INTRAMUSCULAR | Status: AC
  Filled 2023-01-14: qty 20

## 2023-01-14 MED ORDER — ACETAMINOPHEN 325 MG PO TABS: 325.0000 mg | ORAL_TABLET | ORAL | Status: DC | PRN

## 2023-01-14 MED ORDER — TETANUS-DIPHTH-ACELL PERTUSSIS 5-2.5-18.5 LF-MCG/0.5 IM SUSY
0.5000 mL | PREFILLED_SYRINGE | Freq: Once | INTRAMUSCULAR | Status: AC
  Administered 2023-01-14: 0.5 mL via INTRAMUSCULAR
  Filled 2023-01-14: qty 0.5

## 2023-01-14 MED ORDER — ACETAMINOPHEN 160 MG/5ML PO SOLN: 325.0000 mg | ORAL | Status: DC | PRN

## 2023-01-14 MED ORDER — BUPIVACAINE HCL (PF) 0.5 % IJ SOLN
INTRAMUSCULAR | Status: AC
  Filled 2023-01-14: qty 30

## 2023-01-14 MED ORDER — 0.9 % SODIUM CHLORIDE (POUR BTL) OPTIME
TOPICAL | Status: DC | PRN
  Administered 2023-01-14: 1000 mL

## 2023-01-14 MED ORDER — FENTANYL CITRATE (PF) 100 MCG/2ML IJ SOLN
INTRAMUSCULAR | Status: DC | PRN
  Administered 2023-01-14: 100 ug via INTRAVENOUS

## 2023-01-14 MED ORDER — LIDOCAINE 2% (20 MG/ML) 5 ML SYRINGE
INTRAMUSCULAR | Status: DC | PRN
  Administered 2023-01-14: 100 mg via INTRAVENOUS

## 2023-01-14 MED ORDER — ACETAMINOPHEN 10 MG/ML IV SOLN
INTRAVENOUS | Status: DC | PRN
  Administered 2023-01-14: 1000 mg via INTRAVENOUS

## 2023-01-14 MED ORDER — ONDANSETRON HCL 4 MG/2ML IJ SOLN
INTRAMUSCULAR | Status: AC
  Filled 2023-01-14: qty 2

## 2023-01-14 MED ORDER — BUPIVACAINE-EPINEPHRINE (PF) 0.5% -1:200000 IJ SOLN
INTRAMUSCULAR | Status: AC
  Filled 2023-01-14: qty 30

## 2023-01-14 MED ORDER — PHENYLEPHRINE 80 MCG/ML (10ML) SYRINGE FOR IV PUSH (FOR BLOOD PRESSURE SUPPORT)
PREFILLED_SYRINGE | INTRAVENOUS | Status: AC
  Filled 2023-01-14: qty 10

## 2023-01-14 MED ORDER — FENTANYL CITRATE (PF) 100 MCG/2ML IJ SOLN
INTRAMUSCULAR | Status: AC
  Filled 2023-01-14: qty 2

## 2023-01-14 MED ORDER — OXYCODONE HCL 5 MG PO TABS
5.0000 mg | ORAL_TABLET | Freq: Once | ORAL | Status: AC | PRN
  Administered 2023-01-14: 5 mg via ORAL

## 2023-01-14 MED ORDER — CHLORHEXIDINE GLUCONATE 0.12 % MT SOLN
15.0000 mL | Freq: Once | OROMUCOSAL | Status: AC
  Administered 2023-01-14: 15 mL via OROMUCOSAL

## 2023-01-14 MED ORDER — MIDAZOLAM HCL 5 MG/5ML IJ SOLN
INTRAMUSCULAR | Status: DC | PRN
  Administered 2023-01-14: 2 mg via INTRAVENOUS

## 2023-01-14 MED ORDER — SUCCINYLCHOLINE CHLORIDE 200 MG/10ML IV SOSY
PREFILLED_SYRINGE | INTRAVENOUS | Status: AC
  Filled 2023-01-14: qty 10

## 2023-01-14 MED ORDER — LIDOCAINE HCL (PF) 2 % IJ SOLN
INTRAMUSCULAR | Status: AC
  Filled 2023-01-14: qty 5

## 2023-01-14 MED ORDER — CEPHALEXIN 500 MG PO CAPS: 500.0000 mg | ORAL_CAPSULE | Freq: Four times a day (QID) | ORAL | 0 refills | Status: AC

## 2023-01-14 MED ORDER — PROPOFOL 10 MG/ML IV BOLUS
INTRAVENOUS | Status: AC
  Filled 2023-01-14: qty 20

## 2023-01-14 MED ORDER — BUPIVACAINE HCL (PF) 0.5 % IJ SOLN
INTRAMUSCULAR | Status: DC | PRN
  Administered 2023-01-14: 10 mL

## 2023-01-14 SURGICAL SUPPLY — 42 items
BAND RUBBER #18 3X1/16 STRL (MISCELLANEOUS) IMPLANT
BLADE MINI RND TIP GREEN BEAV (BLADE) IMPLANT
BLADE SURG 15 STRL LF DISP TIS (BLADE) ×1 IMPLANT
BNDG COHESIVE 1X5 TAN STRL LF (GAUZE/BANDAGES/DRESSINGS) IMPLANT
BNDG COHESIVE 2X5 TAN ST LF (GAUZE/BANDAGES/DRESSINGS) IMPLANT
BNDG COHESIVE 4X5 TAN STRL LF (GAUZE/BANDAGES/DRESSINGS) IMPLANT
BNDG ESMARK 4X9 LF (GAUZE/BANDAGES/DRESSINGS) IMPLANT
BNDG GAUZE DERMACEA FLUFF 4 (GAUZE/BANDAGES/DRESSINGS) IMPLANT
CHLORAPREP W/TINT 26 (MISCELLANEOUS) ×1 IMPLANT
CORD BIPOLAR FORCEPS 12FT (ELECTRODE) IMPLANT
COVER BACK TABLE 60X90IN (DRAPES) ×1 IMPLANT
COVER MAYO STAND STRL (DRAPES) ×1 IMPLANT
CUFF TOURN SGL QUICK 18X4 (TOURNIQUET CUFF) IMPLANT
DEPRESSOR TONGUE 6 IN STERILE (GAUZE/BANDAGES/DRESSINGS) IMPLANT
DRAPE C-ARM 42X120 X-RAY (DRAPES) ×1 IMPLANT
DRAPE EXTREMITY T 121X128X90 (DISPOSABLE) ×1 IMPLANT
DRAPE SURG 17X23 STRL (DRAPES) ×1 IMPLANT
DRSG EMULSION OIL 3X3 NADH (GAUZE/BANDAGES/DRESSINGS) IMPLANT
GAUZE SPONGE 4X4 12PLY STRL (GAUZE/BANDAGES/DRESSINGS) ×1 IMPLANT
GAUZE STRETCH 2X75IN STRL (MISCELLANEOUS) IMPLANT
GAUZE XEROFORM 1X8 LF (GAUZE/BANDAGES/DRESSINGS) IMPLANT
GLOVE BIO SURGEON STRL SZ7.5 (GLOVE) ×1 IMPLANT
GLOVE BIOGEL PI IND STRL 8 (GLOVE) ×1 IMPLANT
GOWN STRL REUS W/ TWL LRG LVL3 (GOWN DISPOSABLE) ×1 IMPLANT
K-WIRE DBL END TROCAR 6X.045 (WIRE) ×2
KIT BASIN OR (CUSTOM PROCEDURE TRAY) ×1 IMPLANT
KWIRE DBL END TROCAR 6X.045 (WIRE) IMPLANT
NS IRRIG 1000ML POUR BTL (IV SOLUTION) ×1 IMPLANT
PADDING CAST ABS COTTON 4X4 ST (CAST SUPPLIES) IMPLANT
STOCKINETTE 6 STRL (DRAPES) ×1 IMPLANT
SUT CHROMIC 6 0 PS 4 (SUTURE) ×1 IMPLANT
SUT ETHILON 4 0 PS 2 18 (SUTURE) IMPLANT
SUT ETHILON 5 0 PS 2 18 (SUTURE) IMPLANT
SUT FIBERWIRE 4-0 18 TAPR NDL (SUTURE) ×1
SUT VICRYL RAPIDE 4-0 (SUTURE) IMPLANT
SUT VICRYL RAPIDE 4/0 PS 2 (SUTURE) IMPLANT
SUT VICRYL+ 3-0 27IN RB-1 (SUTURE) IMPLANT
SUTURE FIBERWR 4-0 18 TAPR NDL (SUTURE) IMPLANT
SYR 10ML LL (SYRINGE) IMPLANT
SYR BULB EAR ULCER 3OZ GRN STR (SYRINGE) IMPLANT
TOWEL GREEN STERILE FF (TOWEL DISPOSABLE) ×1 IMPLANT
UNDERPAD 30X36 HEAVY ABSORB (UNDERPADS AND DIAPERS) ×1 IMPLANT

## 2023-01-14 NOTE — Op Note (Signed)
NAME: James Edwards, KITCHIN MEDICAL RECORD NO: [id number] ACCOUNT NO: [id number] DATE OF BIRTH: 08-Nov-1950 FACILITY: [facility] LOCATION: [facility] PHYSICIAN: Jeanne Diefendorf C. 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  Revision amputation of the left long finger with neurectomies and advanced flap closure. 2.  Open reduction internal fixation of the middle phalanx of the left ring finger with two 0.045 K-wires. 3.  Repair of flexor digitorum profundus tendon to the left ring finger. 4.  Closure of complex wound measuring approximately 5 cm in the left ring finger.  INDICATIONS:  James Edwards is a 72 year old gentleman who was working with some kind of trimmer type saw this afternoon sustaining lacerations to his left long and left ring fingers.  He presented to the emergency department.  The tip of the left long  finger was avascular on evaluation, hanging on by nearly small piece of soft tissue.  The left ring finger was severely lacerated with an open fracture and obvious tendon injury.  The distal part of the digit was pink, however, and salvage was felt  doable.  Risks, benefits, and alternatives were discussed with the patient in terms of salvage of the fingers.  The long finger was not salvageable.  The ring finger was salvageable, however, did risk future amputation.  The patient agreed with these and  consent was obtained.  DESCRIPTION OF PROCEDURE:  The patient was taken to the operating room and placed supine on the operating room table.  Anesthesia was administered without difficulty.  A timeout was performed identifying the correct procedure and extremity.  The left  upper extremity was prepped and draped in the normal sterile fashion.  A tourniquet  was used on the upper arm.  The arm was exsanguinated and the tourniquet was inflated to 250 mmHg.  The long finger was addressed first.  Again, the distal portion was  avascular and held on with just a small piece of skin.  The distal part was amputated.  Next, the bone was shortened.  The nail matrix was removed, soft tissue was removed and the distal portion of the fingertip was fashioned for a nice closure.   Bilateral neurectomies were performed.  Then, the flaps were drawn together and held in place with multiple interrupted 5-0 nylon suture.  Next, the ring finger was addressed.   Exploration of this wound revealed lacerations to the ulnar neurovascular  bundle as well as laceration to the flexor tendon as well as complete transection of the middle phalanx.  Two 0.045 K-wires were driven in an antegrade fashion from the phalanx out to the end of the finger.  The fracture was reduced and the K-wires were  driven in a retrograde fashion nicely approximating the bone.  Next, the wound was thoroughly irrigated with irrigation solution.  The flexor tendon sheath was opened.  Both ends of the FDP tendon were grasped and sutured in place with modified Kessler  4-0 FiberWire suture with nice effect.  The nerve was isolated, however, each ends were jagged due to the nature of the saw injury and were not suitable for repair.  After additional irrigation, the skin was closed totaling approximately 5 cm with  multiple interrupted 5-0 nylon suture.  The K-wires were cut just exiting the  skin.  Final x-rays revealed near anatomic reduction and good pin placement.  The tourniquet was released.  The ring finger tip returned to a nice pink color.  Sterile  dressings were placed.  Marcaine was infiltrated at the base of the fingers for postoperative pain control.  The patient tolerated the procedure well and was taken to recovery room stable.    VAI D: 01/14/2023 4:39:36 pm T: 01/14/2023 8:13:00 pm  JOB: [id number]/  [id number]

## 2023-01-14 NOTE — Transfer of Care (Signed)
Immediate Anesthesia Transfer of Care Note  Patient: James Edwards  Procedure(s) Performed: REPAIR OF FRACTURE LEFT MIDDLE FINGER AND LEFT RING FINGER. AMPUTATE TIP OF MIDDLE FINGER (Left)  Patient Location: PACU  Anesthesia Type:General  Level of Consciousness: awake, alert , and oriented  Airway & Oxygen Therapy: Patient Spontanous Breathing and Patient connected to nasal cannula oxygen  Post-op Assessment: Report given to RN and Post -op Vital signs reviewed and stable  Post vital signs: Reviewed and stable  Last Vitals:  Vitals Value Taken Time  BP 117/76 01/14/23 1630  Temp    Pulse 93 01/14/23 1631  Resp 16 01/14/23 1631  SpO2 99 % 01/14/23 1631  Vitals shown include unfiled device data.  Last Pain:  Vitals:   01/14/23 1430  TempSrc: Oral  PainSc: 0-No pain         Complications: No notable events documented.

## 2023-01-14 NOTE — Anesthesia Postprocedure Evaluation (Signed)
Anesthesia Post Note  Patient: James Edwards  Procedure(s) Performed: REPAIR OF FRACTURE LEFT MIDDLE FINGER AND LEFT RING FINGER. AMPUTATE TIP OF MIDDLE FINGER (Left)     Patient location during evaluation: PACU Anesthesia Type: General Level of consciousness: awake and alert Pain management: pain level controlled Vital Signs Assessment: post-procedure vital signs reviewed and stable Respiratory status: spontaneous breathing, nonlabored ventilation, respiratory function stable and patient connected to nasal cannula oxygen Cardiovascular status: blood pressure returned to baseline and stable Postop Assessment: no apparent nausea or vomiting Anesthetic complications: no   No notable events documented.  Last Vitals:  Vitals:   01/14/23 1430 01/14/23 1630  BP: (!) 152/91 117/76  Pulse: 86 90  Resp: 16 (!) 26  Temp: 36.6 C 36.7 C  SpO2: 98% 100%    Last Pain:  Vitals:   01/14/23 1630  TempSrc:   PainSc: 0-No pain                 Anderia Lorenzo

## 2023-01-14 NOTE — Anesthesia Procedure Notes (Signed)
Procedure Name: LMA Insertion Date/Time: 01/14/2023 3:15 PM  Performed by: Vanessa Gallatin, CRNAPre-anesthesia Checklist: Emergency Drugs available, Patient identified, Suction available and Patient being monitored Patient Re-evaluated:Patient Re-evaluated prior to induction Oxygen Delivery Method: Circle system utilized Preoxygenation: Pre-oxygenation with 100% oxygen Induction Type: IV induction Ventilation: Mask ventilation without difficulty LMA: LMA inserted LMA Size: 4.0 Number of attempts: 1 Placement Confirmation: positive ETCO2 and breath sounds checked- equal and bilateral Tube secured with: Tape Dental Injury: Teeth and Oropharynx as per pre-operative assessment

## 2023-01-14 NOTE — ED Provider Notes (Signed)
[place] EMERGENCY DEPARTMENT AT [facility] Provider Note   CSN: [id number] Arrival date & time: 01/14/23  1249     History  Chief Complaint  Patient presents with   Extremity Laceration    James Edwards is a 72 y.o. male.  72 year old male sustained injury to his right hand.  Was using a skill saw and injured the left middle and ring finger just prior to arrival.  Notes numbness to the distal tip of his left middle finger.  Decreased sensation to the distal part of left ring finger.  Bleeding controlled with direct pressure.       Home Medications Prior to Admission medications   Medication Sig Start Date End Date Taking? Authorizing Provider  acetaminophen (TYLENOL) 500 MG tablet Take 1,000 mg by mouth every 6 (six) hours as needed for moderate pain.    [provider]  diltiazem (CARDIZEM CD) 120 MG 24 hr capsule Take 1 capsule (120 mg total) by mouth daily. Please keep scheduled appointment for future refills. Thank you. 12/23/22   Jake Bathe, MD  diltiazem (CARDIZEM) 30 MG tablet Take 1 tablet (30 mg total) by mouth every 6 (six) hours as needed (breakthrough palpitations). 11/08/21   Meriam Sprague, MD  isosorbide mononitrate (IMDUR) 30 MG 24 hr tablet Take 0.5 tablets (15 mg total) by mouth daily. 10/01/22   Tereso Newcomer T, PA-C  losartan (COZAAR) 50 MG tablet Take 1 tablet (50 mg total) by mouth daily. 08/11/22   Meriam Sprague, MD  Multiple Vitamin (MULTIVITAMIN) tablet Take 1 tablet by mouth daily.    [provider]  nitroGLYCERIN (NITROSTAT) 0.4 MG SL tablet Place 1 tablet (0.4 mg total) under the tongue every 5 (five) minutes as needed for chest pain. 05/14/20 07/24/21  Meriam Sprague, MD  rivaroxaban (XARELTO) 20 MG TABS tablet Take 1 tablet (20 mg total) by mouth daily with supper. 11/25/22   Jake Bathe, MD  rosuvastatin (CRESTOR) 20 MG tablet Take 1 tablet (20 mg total) by mouth daily. 03/18/22   Meriam Sprague, MD  solifenacin (VESICARE) 5 MG tablet Take 5 mg by mouth daily. 11/27/22   [provider]  tamsulosin (FLOMAX) 0.4 MG CAPS capsule Take 1 capsule (0.4 mg total) by mouth at bedtime. 04/11/21   Heloise Purpura, MD  traZODone (DESYREL) 100 MG tablet Take 1 tablet (100 mg total) by mouth at bedtime as needed for sleep. 12/16/22   Nafziger, Kandee Keen, NP  trolamine salicylate (ASPERCREME) 10 % cream Apply 1 application topically as needed for muscle pain.    [provider]  Turmeric 1053 MG TABS Take 3 tablets by mouth daily.    [provider]      Allergies    Codeine    Review of Systems   Review of Systems  All other systems reviewed and are negative.   Physical Exam Updated Vital Signs BP (!) 141/84   Pulse 100   Temp 98.2 F (36.8 C) (Oral)   Resp 19   Ht 1.753 m (5\' 9" )   Wt 88 kg   SpO2 98%   BMI 28.65 kg/m  Physical Exam Vitals and nursing note reviewed.  Constitutional:      General: He is not in acute distress.    Appearance: Normal appearance. He is well-developed. He is not toxic-appearing.  HENT:     Head: Normocephalic and atraumatic.  Eyes:     General: Lids are normal.  Conjunctiva/sclera: Conjunctivae normal.     Pupils: Pupils are equal, round, and reactive to light.  Neck:     Thyroid: No thyroid mass.     Trachea: No tracheal deviation.  Cardiovascular:     Rate and Rhythm: Normal rate and regular rhythm.     Heart sounds: Normal heart sounds. No murmur heard.    No gallop.  Pulmonary:     Effort: Pulmonary effort is normal. No respiratory distress.     Breath sounds: Normal breath sounds. No stridor. No decreased breath sounds, wheezing, rhonchi or rales.  Abdominal:     General: There is no distension.     Palpations: Abdomen is soft.     Tenderness: There is no abdominal tenderness. There is no rebound.  Musculoskeletal:        General: No tenderness. Normal range of motion.     Cervical back: Normal range  of motion and neck supple.     Comments: Near complete amputation of the distal tip of the left middle finger with diagonal laceration of the left ring finger at the DIP.  Some sensation at the tip of the left ring finger present.  No sensation at the distal tip of the left middle finger  Skin:    General: Skin is warm and dry.     Findings: No abrasion or rash.  Neurological:     Mental Status: He is alert and oriented to person, place, and time. Mental status is at baseline.     GCS: GCS eye subscore is 4. GCS verbal subscore is 5. GCS motor subscore is 6.     Cranial Nerves: Cranial nerves are intact. No cranial nerve deficit.     Sensory: No sensory deficit.     Motor: Motor function is intact.  Psychiatric:        Attention and Perception: Attention normal.        Speech: Speech normal.        Behavior: Behavior normal.     ED Results / Procedures / Treatments   Labs (all labs ordered are listed, but only abnormal results are displayed) Labs Reviewed - No data to display  EKG None  Radiology No results found.  Procedures .Nerve Block  Date/Time: 01/14/2023 1:54 PM  Performed by: Lorre Nick, MD Authorized by: Lorre Nick, MD   Consent:    Consent obtained:  Verbal   Risks, benefits, and alternatives were discussed: yes   Universal protocol:    Immediately prior to procedure, a time out was called: yes     Patient identity confirmed:  Verbally with patient Indications:    Indications:  Pain relief Location:    Body area:  Upper extremity   Upper extremity nerve:  Metacarpal   Laterality:  Left Pre-procedure details:    Skin preparation:  Povidone-iodine   Preparation: Patient was prepped and draped in usual sterile fashion   Skin anesthesia:    Skin anesthesia method:  None Procedure details:    Block needle gauge:  14 G   Anesthetic injected:  Lidocaine 2% w/o epi   Steroid injected:  None   Additive injected:  None   Injection procedure:  Anatomic  landmarks identified and incremental injection   Paresthesia:  Prolonged Post-procedure details:    Dressing:  None   Outcome:  Anesthesia achieved   Procedure completion:  Tolerated     Medications Ordered in ED Medications  ceFAZolin (ANCEF) IVPB 2g/100 mL premix (has no administration in time range)  Tdap (BOOSTRIX) injection 0.5 mL (has no administration in time range)    ED Course/ Medical Decision Making/ A&P                                 Medical Decision Making Amount and/or Complexity of Data Reviewed Labs: ordered. Radiology: ordered.  Risk Prescription drug management.   Patient given digital nerve blocks to his left hand.  Started on Ancef 2 g IV piggyback.  Tetanus status updated.  Case discussed with Dr. Georga Bora will take patient to the OR for operative repair.        Final Clinical Impression(s) / ED Diagnoses Final diagnoses:  None    Rx / DC Orders ED Discharge Orders     None         Lorre Nick, MD 01/14/23 1355

## 2023-01-14 NOTE — Discharge Instructions (Signed)
Keep dressings on fingers clean, dry and intact.

## 2023-01-14 NOTE — H&P (Signed)
Reason for Consult:finger lacerations Referring Physician: ER  CC:I cut my fingers with a trimmer  HPI:  James Edwards is an 72 y.o. 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   Pain is rated at   3 /10 and is described as dull.  Pain is constant.  Pain is made better by rest/immobilization, worse with motion.  Pt received finger block by ER! Associated signs/symptoms:no other injuries Previous treatment:  n/a  Past Medical History:  Diagnosis Date   BPH without obstruction/lower urinary tract symptoms    Coronary artery disease    cardiologist--- dr Shari Prows;  cath 05-18-2020  mininmal nonobstructive disease   ED (erectile dysfunction)    GERD (gastroesophageal reflux disease)    no meds   History of 2019 novel coronavirus disease (COVID-19) 2020   per pt asymptomatic   History of melanoma excision 06/2020   per pt localized area mid back   Hyperlipidemia, mixed    Hypertension    followed by pcp   Mild obstructive sleep apnea    followed by dr young;   study in epic 09-25-2020,  AHI 8.8/hr,  no cpap   OA (osteoarthritis)    Persistent atrial fibrillation (HCC) 03/2020   cardiologist-- dr h. Shari Prows---  on xarelto and bb;  work-up includes TTE, CTA, event monitor, and cardiac cath (results in epic),  pt ef 55-656 per cath and echo 55%   Pre-diabetes    Prostate cancer (HCC) 08/2009   urologist--- dr Laverle Patter--- first dx 07/ 2011,  Gleason 3+3,  active surveillance since;  last bx 06/ 2022  Gleason 7 scheduled for brachtherapy   Wears glasses     Past Surgical History:  Procedure Laterality Date   COLONOSCOPY     last one 2019 approx   ELBOW SURGERY Right 2007   LEFT HEART CATH AND CORONARY ANGIOGRAPHY N/A 05/18/2020   Procedure: LEFT HEART CATH AND CORONARY ANGIOGRAPHY;  Surgeon: Swazil[place], Peter M, MD;  Location: [facility] INVASIVE CV LAB;  Service: Cardiovascular;  Laterality: N/A;   PROSTATE BIOPSY N/A 01/21/2016   Procedure:  BIOPSY TRANSRECTAL ULTRASONIC PROSTATE (TUBP);  Surgeon: Heloise Purpura, MD;  Location: [facility];  Service: Urology;  Laterality: N/A;   PROSTATE BIOPSY N/A 07/15/2018   Procedure: NEEDLE BIOPSY TRANSRECTAL ULTRASONIC PROSTATE (TUBP);  Surgeon: Heloise Purpura, MD;  Location: [facility];  Service: Urology;  Laterality: N/A;   PROSTATE BIOPSY N/A 08/13/2020   Procedure: BIOPSY TRANSRECTAL ULTRASONIC PROSTATE (TUBP);  Surgeon: Heloise Purpura, MD;  Location: [facility];  Service: Urology;  Laterality: N/A;   RADIOACTIVE SEED IMPLANT N/A 04/11/2021   Procedure: RADIOACTIVE SEED IMPLANT/BRACHYTHERAPY IMPLANT WITH CYSTOSCOPY;  Surgeon: Heloise Purpura, MD;  Location: [facility];  Service: Urology;  Laterality: N/A;   SPACE OAR INSTILLATION N/A 04/11/2021   Procedure: SPACE OAR INSTILLATION;  Surgeon: Heloise Purpura, MD;  Location: [facility];  Service: Urology;  Laterality: N/A;    Family History  Problem Relation Age of Onset   Heart disease Mother    Hypertension Mother    Glaucoma Mother    Arthritis Father    Heart disease Father    Hypertension Father    Bladder Cancer Father 33   Colon polyps Father    Dementia Brother    Lymphoma Cousin    Prostate cancer Cousin        mother's first cousin   Colon cancer Neg  Hx    Esophageal cancer Neg Hx    Rectal cancer Neg Hx    Stomach cancer Neg Hx    Breast cancer Neg Hx     Social History:  reports that he quit smoking about 15 years ago. His smoking use included cigarettes. He started smoking about 50 years ago. He has a 70 pack-year smoking history. He has never used smokeless tobacco. He reports current alcohol use of about 42.0 standard drinks of alcohol per week. He reports that he does not use drugs.  Allergies:  Allergies  Allergen Reactions   Codeine Nausea And Vomiting    Medications: I have reviewed the patient's current medications.  Results for orders placed or performed during the  hospital encounter of 01/14/23 (from the past 48 hour(s))  Basic metabolic panel     Status: Abnormal   Collection Time: 01/14/23  2:18 PM  Result Value Ref Range   Sodium 135 135 - 145 mmol/L   Potassium 3.9 3.5 - 5.1 mmol/L   Chloride 104 98 - 111 mmol/L   CO2 21 (L) 22 - 32 mmol/L   Glucose, Bld 124 (H) 70 - 99 mg/dL    Comment: Glucose reference range applies only to samples taken after fasting for at least 8 hours.   BUN 10 8 - 23 mg/dL   Creatinine, Ser 8.41 0.61 - 1.24 mg/dL   Calcium 32.4 8.9 - 40.1 mg/dL   GFR, Estimated >02 >72 mL/min    Comment: (NOTE) Calculated using the CKD-EPI Creatinine Equation (2021)    Anion gap 10 5 - 15    Comment: Performed at [facility], [address], [place], [place] [address]    DG Hand Complete Left  Result Date: 01/14/2023 CLINICAL DATA:  Hand injury. Sharp middle tool cut distal third and fourth fingers. EXAM: LEFT HAND - COMPLETE 3+ VIEW COMPARISON:  None Available. FINDINGS: Normal bone mineralization. There is oblique linear lucency within the distal aspect of the distal phalanx of the third finger, a fracture with up to 4 mm craniocaudal diastasis at the lateral/radial aspect and 2 mm diastasis at the medial/ulnar aspect. There are small volar and dorsal comminuted fracture fragments on lateral view. Mild medial/ulnar angulation of the distal aspect of the distal fracture component. Oblique fracture of the distal metaphysis of the middle phalanx of the fourth finger with up to 3 mm medial/ulnar displacement of the distal fracture component with respect to the proximal fracture component. Mild fifth DIP joint space narrowing. Moderate to severe thumb carpometacarpal joint space narrowing, subchondral sclerosis, and peripheral osteophytosis. Metallic ring obscures portions of the proximal phalanx of the fourth finger. No dislocation. IMPRESSION: 1. Acute comminuted and mildly displaced fracture of the distal phalanx of the  third finger. 2. Acute mildly displaced fracture of the middle phalanx of the fourth finger. 3. 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 Electronically Signed   By: Neita Garnet M.D.   On: 01/14/2023 14:17    Pertinent items are noted in HPI. Temp:  [97.8 F (36.6 C)-98.2 F (36.8 C)] 97.8 F (36.6 C) (11/27 1430) Pulse Rate:  [86-100] 86 (11/27 1430) Resp:  [16-19] 16 (11/27 1430) BP: (141-152)/(84-91) 152/91 (11/27 1430) SpO2:  [98 %] 98 % (11/27 1430) Weight:  [86.2 kg-88 kg] 86.2 kg (11/27 1430) General appearance: alert and cooperative Resp: clear to auscultation bilaterally Cardio: regular rate and rhythm GI: soft, non-tender; bowel sounds normal; no masses,  no organomegaly Extremities: RUE - wnl in appearance  and function; LUE wnl except for near amputation of LLF at dipj, tip pale in appearance ; LRF with near amputation, distal tip pink, open fracture,    Assessment: 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  The patient understands that additional treatment may be necessary.  The patient understands that LRF may result in amputation.  Sydni Elizarraraz Harle Battiest 01/14/2023, 2:58 PM

## 2023-01-14 NOTE — Anesthesia Preprocedure Evaluation (Addendum)
Anesthesia Evaluation  Patient identified by MRN, date of birth, ID band Patient awake    Reviewed: Allergy & Precautions, NPO status , Patient's Chart, lab work & pertinent test results  History of Anesthesia Complications Negative for: history of anesthetic complications  Airway Mallampati: III  TM Distance: >3 FB Neck ROM: Full    Dental  (+) Dental Advisory Given, Missing, Chipped, Loose, Poor Dentition   Pulmonary sleep apnea , former smoker   Pulmonary exam normal        Cardiovascular hypertension, Pt. on medications and Pt. on home beta blockers + CAD  Normal cardiovascular exam+ dysrhythmias Atrial Fibrillation    Cath 05/18/20: 1. Minor nonobstructive CAD 2. Normal LV function 3. Normal LVEDP  Echo 04/13/20: EF 55% with inferior basal hypokinesis, no RWMA, nl RV size/fn, mod LAE, AV sclerosis w/o stenosis, valves otherwise unremarkable   Neuro/Psych negative neurological ROS     GI/Hepatic Neg liver ROS,GERD  Medicated,,  Endo/Other  negative endocrine ROS    Renal/GU negative Renal ROS   Prostate cancer    Musculoskeletal  (+) Arthritis , Osteoarthritis,    Abdominal   Peds  Hematology  (+) Blood dyscrasia, anemia Xarelto, last dose 2/19   Anesthesia Other Findings   Reproductive/Obstetrics                             Anesthesia Physical Anesthesia Plan  ASA: 3 and emergent  Anesthesia Plan: General   Post-op Pain Management: Ofirmev IV (intra-op)*   Induction: Intravenous  PONV Risk Score and Plan: 2 and Ondansetron, Dexamethasone, Treatment may vary due to age or medical condition and Midazolam  Airway Management Planned: LMA  Additional Equipment: None  Intra-op Plan:   Post-operative Plan: Extubation in OR  Informed Consent: I have reviewed the patients History and Physical, chart, labs and discussed the procedure including the risks, benefits and  alternatives for the proposed anesthesia with the patient or authorized representative who has indicated his/her understanding and acceptance.     Dental advisory given  Plan Discussed with: Anesthesiologist and CRNA  Anesthesia Plan Comments:         Anesthesia Quick Evaluation

## 2023-01-14 NOTE — ED Triage Notes (Addendum)
Patients left middle tip and ring finger got cut off by a sharp metal tool. Unknown when tetanus was updated. Takes a blood thinner.

## 2023-01-15 ENCOUNTER — Encounter (HOSPITAL_COMMUNITY): Payer: Self-pay | Admitting: General Surgery

## 2023-01-18 ENCOUNTER — Other Ambulatory Visit: Payer: Self-pay | Admitting: Cardiology

## 2023-01-20 ENCOUNTER — Encounter: Payer: Self-pay | Admitting: Cardiology

## 2023-01-20 ENCOUNTER — Ambulatory Visit: Payer: Medicare Other | Attending: Cardiology | Admitting: Cardiology

## 2023-01-20 VITALS — BP 130/70 | HR 86 | Resp 16 | Ht 69.0 in | Wt 193.0 lb

## 2023-01-20 DIAGNOSIS — I10 Essential (primary) hypertension: Secondary | ICD-10-CM | POA: Insufficient documentation

## 2023-01-20 DIAGNOSIS — I4819 Other persistent atrial fibrillation: Secondary | ICD-10-CM | POA: Insufficient documentation

## 2023-01-20 DIAGNOSIS — I2510 Atherosclerotic heart disease of native coronary artery without angina pectoris: Secondary | ICD-10-CM | POA: Diagnosis not present

## 2023-01-20 DIAGNOSIS — E782 Mixed hyperlipidemia: Secondary | ICD-10-CM | POA: Diagnosis not present

## 2023-01-20 NOTE — Patient Instructions (Signed)

## 2023-01-20 NOTE — Progress Notes (Signed)
Cardiology Office Note:  .   Date:  01/20/2023  ID:  James Edwards, DOB August 18, 1950, MRN [id number] PCP: Shirline Frees, NP  [facility] Providers Cardiologist:  Donato Schultz, MD Electrophysiologist:  Hillis Range, MD (Inactive)     History of Present Illness: .   James Edwards is a 72 y.o. male Discussed with the use of AI scribe History of Present Illness   The patient is a 72 year old individual with a history of nonobstructive coronary artery disease, persistent atrial fibrillation, and prostate cancer. He underwent a left heart catheterization in April 2022, which revealed 20% proximal LAD and 30% distal left circumflex. The patient is currently on medical management, including Xarelto, Crestor 20mg  daily, Losartan 50mg  daily, and Isosorbide 15mg  daily. The patient's LDL goal is less than 70, and his hyperlipidemia is well-controlled with Crestor, achieving an LDL of 52 in December 2023.  The patient's atrial fibrillation is managed with Diltiazem 120mg  daily and Diltiazem 30mg  tid prn for heart rates greater than 100. Metoprolol was previously discontinued due to fatigue. The patient's blood pressure is well-controlled with Diltiazem and Losartan, typically in the 120s over 80s.  The patient's prostate cancer, with a Gleason score of 6, is managed by Dr. Laverle Patter and has been treated with radiation seed implant. The patient also experiences right lower extremity swelling due to venous stasis, with a vascular ultrasound in June 2024 showing no evidence of DVT.  The patient underwent a coronary CT scan in March 2022, which revealed a calcium score of 224 (50th percentile), mild calcified plaque in the proximal and mid LAD, and a small focal circumflex plaque of 24%.  The patient reports that his atrial fibrillation symptoms have improved, with less frequent episodes. He also reports an increase in his Trazodone dosage, which has improved his sleep and mood. The patient is due for  a second shingles shot and has decided against a COVID vaccine due to previous adverse reactions.  The patient also reports that his esophagus is closing up and is scheduled for a dilation procedure in February. The patient's heart rate is stable at 80, and his kidney function, hemoglobin, and A1c are all within normal ranges.          Studies Reviewed: Marland Kitchen   EKG Interpretation Date/Time:  Tuesday January 20 2023 08:58:46 EST Ventricular Rate:  80 PR Interval:    QRS Duration:  78 QT Interval:  370 QTC Calculation: 426 R Axis:   -19  Text Interpretation: Atrial fibrillation When compared with ECG of 12-Apr-2020 14:24, No significant change since last tracing Confirmed by Donato Schultz ([id number]) on 01/20/2023 9:05:29 AM    Results LABS LDL: 52 (01/2022) Creatinine: 1.0 Hemoglobin: 13.7 A1c: 6.1  RADIOLOGY Vascular ultrasound: No evidence of DVT (08/12/2022) Coronary CT scan: Calcium score 224 (50th percentile), mild calcified plaque in proximal and mid LAD, negative FFR, small focal circumflex plaque 24% (04/23/2020)  DIAGNOSTIC Left heart catheterization: 20% proximal LAD, 30% distal left circumflex (05/18/2020) Echocardiogram: Severe left atrial enlargement EKG: Heart rate 80, atrial fibrillation (01/20/2023)  PATHOLOGY Gleason score: 6  Risk Assessment/Calculations:            Physical Exam:   VS:  BP 130/70 (BP Location: Left Arm, Patient Position: Sitting, Cuff Size: Normal)   Pulse 86   Resp 16   Ht 5\' 9"  (1.753 m)   Wt 193 lb (87.5 kg)   SpO2 98%   BMI 28.50 kg/m    Wt Readings from  Last 3 Encounters:  01/20/23 193 lb (87.5 kg)  01/14/23 190 lb (86.2 kg)  12/16/22 193 lb (87.5 kg)    GEN: Well nourished, well developed in no acute distress NECK: No JVD; No carotid bruits CARDIAC: IRRR, no murmurs, no rubs, no gallops RESPIRATORY:  Clear to auscultation without rales, wheezing or rhonchi  ABDOMEN: Soft, non-tender, non-distended EXTREMITIES:  No edema;  Left fingers dressed after pruning accident.   ASSESSMENT AND PLAN: .    Assessment and Plan    Persistent Atrial Fibrillation   Persistent atrial fibrillation with a CHADS-VASc score of 3. Severe left atrial enlargement on echocardiogram, making him a poor candidate for ablation. Currently managed with diltiazem 120 mg daily and diltiazem 30 mg TID PRN for heart rates >100. Metoprolol was previously stopped due to intolerance of fatigue. Heart rate is well controlled at 80 BPM. Discussed newer ablation techniques with a success rate as low as 50% due to extensive scar tissue and left atrial dilation. Patient prefers to continue with current rate control strategy.   - Continue diltiazem 120 mg daily   - Continue diltiazem 30 mg TID PRN for heart rates >100   - Continue Xarelto for stroke prevention    Nonobstructive Coronary Artery Disease   Nonobstructive coronary artery disease with a left heart catheterization on 05/18/2020 showing 20% proximal LAD and 30% distal left circumflex. Managed with Crestor 20 mg daily, losartan 50 mg daily, and isosorbide 15 mg daily. LDL goal is less than 70, currently at 52 as of December 2023. No changes needed.   - Continue Crestor 20 mg daily   - Continue losartan 50 mg daily   - Continue isosorbide 15 mg daily    Hyperlipidemia   Hyperlipidemia well controlled with Crestor 20 mg daily. LDL is 52 as of December 2023.   - Continue Crestor 20 mg daily    Right Lower Extremity Swelling   Right lower extremity swelling secondary to venous stasis. Vascular ultrasound on 08/12/2022 showed no evidence of DVT.   - Monitor for changes or worsening symptoms. Conservative mgt.   Prostate Cancer   Prostate cancer with a Gleason score of 6, followed by Dr. Laverle Patter. Patient has had radiation seed implant.   - Continue follow-up with Dr. Laverle Patter    General Health Maintenance   Received second shingles shot. Declined COVID vaccine due to previous adverse reactions.    - Administer second shingles shot   - Patient declined COVID vaccine    Follow-up   - Schedule follow-up in two years. I'm OK with Shirline Frees NP refilling meds.  - Contact cardiology if new issues arise.              Signed, Donato Schultz, MD

## 2023-02-02 ENCOUNTER — Other Ambulatory Visit: Payer: Self-pay | Admitting: Cardiology

## 2023-02-02 MED ORDER — LOSARTAN POTASSIUM 50 MG PO TABS: 50.0000 mg | ORAL_TABLET | Freq: Every day | ORAL | 3 refills | Status: DC

## 2023-02-13 ENCOUNTER — Other Ambulatory Visit: Payer: Self-pay

## 2023-02-13 MED ORDER — ROSUVASTATIN CALCIUM 20 MG PO TABS: 20.0000 mg | ORAL_TABLET | Freq: Every day | ORAL | 3 refills | Status: DC

## 2023-02-20 DIAGNOSIS — S62625B Displaced fracture of medial phalanx of left ring finger, initial encounter for open fracture: Secondary | ICD-10-CM | POA: Diagnosis not present

## 2023-02-25 ENCOUNTER — Encounter: Payer: Self-pay | Admitting: Adult Health

## 2023-02-25 ENCOUNTER — Ambulatory Visit: Payer: Medicare Other | Admitting: Adult Health

## 2023-02-25 VITALS — BP 128/80 | HR 91 | Temp 97.5°F | Ht 68.25 in | Wt 195.0 lb

## 2023-02-25 DIAGNOSIS — C61 Malignant neoplasm of prostate: Secondary | ICD-10-CM | POA: Diagnosis not present

## 2023-02-25 DIAGNOSIS — N3943 Post-void dribbling: Secondary | ICD-10-CM | POA: Diagnosis not present

## 2023-02-25 DIAGNOSIS — E782 Mixed hyperlipidemia: Secondary | ICD-10-CM | POA: Diagnosis not present

## 2023-02-25 DIAGNOSIS — N401 Enlarged prostate with lower urinary tract symptoms: Secondary | ICD-10-CM | POA: Diagnosis not present

## 2023-02-25 DIAGNOSIS — I10 Essential (primary) hypertension: Secondary | ICD-10-CM | POA: Diagnosis not present

## 2023-02-25 DIAGNOSIS — R7303 Prediabetes: Secondary | ICD-10-CM | POA: Diagnosis not present

## 2023-02-25 DIAGNOSIS — G4733 Obstructive sleep apnea (adult) (pediatric): Secondary | ICD-10-CM | POA: Diagnosis not present

## 2023-02-25 DIAGNOSIS — F5101 Primary insomnia: Secondary | ICD-10-CM | POA: Diagnosis not present

## 2023-02-25 DIAGNOSIS — I4819 Other persistent atrial fibrillation: Secondary | ICD-10-CM | POA: Diagnosis not present

## 2023-02-25 LAB — CBC
HCT: 41.1 % (ref 39.0–52.0)
Hemoglobin: 13.8 g/dL (ref 13.0–17.0)
MCHC: 33.6 g/dL (ref 30.0–36.0)
MCV: 99 fL (ref 78.0–100.0)
Platelets: 225 10*3/uL (ref 150.0–400.0)
RBC: 4.15 Mil/uL — ABNORMAL LOW (ref 4.22–5.81)
RDW: 13.3 % (ref 11.5–15.5)
WBC: 5.8 10*3/uL (ref 4.0–10.5)

## 2023-02-25 LAB — COMPREHENSIVE METABOLIC PANEL
ALT: 24 U/L (ref 0–53)
AST: 22 U/L (ref 0–37)
Albumin: 4 g/dL (ref 3.5–5.2)
Alkaline Phosphatase: 38 U/L — ABNORMAL LOW (ref 39–117)
BUN: 13 mg/dL (ref 6–23)
CO2: 27 meq/L (ref 19–32)
Calcium: 9.7 mg/dL (ref 8.4–10.5)
Chloride: 103 meq/L (ref 96–112)
Creatinine, Ser: 0.94 mg/dL (ref 0.40–1.50)
GFR: 80.46 mL/min (ref >60.00)
Glucose, Bld: 129 mg/dL — ABNORMAL HIGH (ref 70–99)
Potassium: 4.2 meq/L (ref 3.5–5.1)
Sodium: 135 meq/L (ref 135–145)
Total Bilirubin: 0.6 mg/dL (ref 0.2–1.2)
Total Protein: 6.6 g/dL (ref 6.0–8.3)

## 2023-02-25 LAB — LIPID PANEL
Cholesterol: 135 mg/dL (ref 0–200)
HDL: 58 mg/dL (ref >39.00)
LDL Cholesterol: 59 mg/dL (ref 0–99)
NonHDL: 76.84
Total CHOL/HDL Ratio: 2
Triglycerides: 91 mg/dL (ref 0.0–149.0)
VLDL: 18.2 mg/dL (ref 0.0–40.0)

## 2023-02-25 LAB — TSH: TSH: 1.48 u[IU]/mL (ref 0.35–5.50)

## 2023-02-25 LAB — HEMOGLOBIN A1C: Hgb A1c MFr Bld: 6.4 % (ref 4.6–6.5)

## 2023-02-25 NOTE — Progress Notes (Signed)
 Subjective:    Patient ID: James Edwards, male    DOB: February 08, 1951, 73 y.o.   MRN: [id number]  HPI Patient presents for yearly preventative medicine examination. He is a pleasant 73 year old male who  has a past medical history of BPH without obstruction/lower urinary tract symptoms, Coronary artery disease, ED (erectile dysfunction), GERD (gastroesophageal reflux disease), History of 2019 novel coronavirus disease (COVID-19) (2020), History of melanoma excision (06/2020), Hyperlipidemia, mixed, Hypertension, Mild obstructive sleep apnea, OA (osteoarthritis), Persistent atrial fibrillation (HCC) (03/2020), Pre-diabetes, Prostate cancer (HCC) (08/2009), and Wears glasses.  HTN -managed with diltiazem  120 mg daily and losartan  50 mg daily.SABRA  He does monitor blood pressure at home and reports readings in the 120s to 140s over 70s to 80s consistently.  BP Readings from Last 3 Encounters:  02/25/23 128/80  01/20/23 130/70  01/14/23 131/85    Hyperlipidemia-managed with Crestor  20 mg daily. Denies myalgia or fatigue  Lab Results  Component Value Date   CHOL 141 01/24/2022   HDL 70 01/24/2022   LDLCALC 52 01/24/2022   LDLDIRECT 97.0 01/03/2016   TRIG 107 01/24/2022   CHOLHDL 2.0 01/24/2022   Atrial Fibrillation -he is managed with diltiazem  120 mg daily and diltiazem  30 mg 3 times daily as needed for heart rates greater than 100.  Metoprolol  was previously discontinued due to fatigue.  Since switching till diltiazem  his atrial fibrillation symptoms have improved with less frequent episodes.  He is on chronic anticoagulation with  Xarelto  20 mg daily.  Has intermittent chest pain and fatigue. He feels as though he is always in a fib. Managed by cardiology   Insomnia-takes trazodone  100 mg nightly. He feels as though he is sleeping well   History of prostate Cancer-has been under active and surveillance for multiple years.  His Gleason score has increased, does have a procedure scheduled in  February 2023 for radioactive seed implantation.  OSA -very mild OSA due to hypertension and A. fib.  He is going to talk to his dentist about OAP otherwise pulmonary felt that conservative measures may be sufficient.  Glucose Intolerance - not currently on medications  Lab Results  Component Value Date   HGBA1C 6.1 (H) 01/24/2022   BPH - takes Vesicare 5 mg daily. Reports that this helps but does not get rid of his symptoms.    All immunizations and health maintenance protocols were reviewed with the patient and needed orders were placed.  Appropriate screening laboratory values were ordered for the patient including screening of hyperlipidemia, renal function and hepatic function. If indicated by BPH, a PSA was ordered.  Medication reconciliation,  past medical history, social history, problem list and allergies were reviewed in detail with the patient  Goals were established with regard to weight loss, exercise, and  diet in compliance with medications Wt Readings from Last 3 Encounters:  02/25/23 195 lb (88.5 kg)  01/20/23 193 lb (87.5 kg)  01/14/23 190 lb (86.2 kg)   He is due for colon cancer screening- he has an appointment with GI in February.   Review of Systems  Constitutional: Negative.   HENT: Negative.    Eyes: Negative.   Respiratory: Negative.    Cardiovascular: Negative.   Gastrointestinal: Negative.   Endocrine: Negative.   Genitourinary: Negative.   Musculoskeletal: Negative.   Skin: Negative.   Allergic/Immunologic: Negative.   Neurological: Negative.   Hematological: Negative.   Psychiatric/Behavioral: Negative.    All other systems reviewed and are negative.  Past  Medical History:  Diagnosis Date   BPH without obstruction/lower urinary tract symptoms    Coronary artery disease    cardiologist--- dr hobart;  cath 05-18-2020  mininmal nonobstructive disease   ED (erectile dysfunction)    GERD (gastroesophageal reflux disease)    no meds    History of 2019 novel coronavirus disease (COVID-19) 2020   per pt asymptomatic   History of melanoma excision 06/2020   per pt localized area mid back   Hyperlipidemia, mixed    Hypertension    followed by pcp   Mild obstructive sleep apnea    followed by dr young;   study in epic 09-25-2020,  AHI 8.8/hr,  no cpap   OA (osteoarthritis)    Persistent atrial fibrillation (HCC) 03/2020   cardiologist-- dr h. hobart---  on xarelto  and bb;  work-up includes TTE, CTA, event monitor, and cardiac cath (results in epic),  pt ef 55-656 per cath and echo 55%   Pre-diabetes    Prostate cancer (HCC) 08/2009   urologist--- dr renda--- first dx 07/ 2011,  Gleason 3+3,  active surveillance since;  last bx 06/ 2022  Gleason 7 scheduled for brachtherapy   Wears glasses     Social History   Socioeconomic History   Marital status: Married    Spouse name: Not on file   Number of children: 1   Years of education: Not on file   Highest education level: Bachelor's degree (e.g., BA, AB, BS)  Occupational History   Not on file  Tobacco Use   Smoking status: Former    Current packs/day: 0.00    Average packs/day: 2.0 packs/day for 35.0 years (70.0 ttl pk-yrs)    Types: Cigarettes    Start date: 4    Quit date: 2009    Years since quitting: 16.0   Smokeless tobacco: Never  Vaping Use   Vaping status: Never Used  Substance and Sexual Activity   Alcohol use: Yes    Alcohol/week: 42.0 standard drinks of alcohol    Types: 42 Cans of beer per week    Comment: 6pk beer per day  (12 oz each)   Drug use: Never   Sexual activity: Not on file  Other Topics Concern   Not on file  Social History Narrative   Lives in [place] with wife.   Retired.  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    Two Children who both live locally.       He likes to sleep.    Social Drivers of Corporate Investment Banker Strain: Low Risk  (02/23/2023)   Overall  Financial Resource Strain (CARDIA)    Difficulty of Paying Living Expenses: Not hard at all  Food Insecurity: No Food Insecurity (02/23/2023)   Hunger Vital Sign    Worried About Running Out of Food in the Last Year: Never true    Ran Out of Food in the Last Year: Never true  Transportation Needs: No Transportation Needs (02/23/2023)   PRAPARE - Administrator, Civil Service (Medical): No    Lack of Transportation (Non-Medical): No  Physical Activity: Sufficiently Active (02/23/2023)   Exercise Vital Sign    Days of Exercise per Week: 7 days    Minutes of Exercise per Session: 30 min  Stress: Stress Concern Present (02/23/2023)   [employer] of Occupational Health - Occupational Stress Questionnaire    Feeling of Stress : To some  extent  Social Connections: Moderately Isolated (02/23/2023)   Social Connection and Isolation Panel [NHANES]    Frequency of Communication with Friends and Family: Patient declined    Frequency of Social Gatherings with Friends and Family: More than three times a week    Attends Religious Services: Never    Database Administrator or Organizations: No    Attends Banker Meetings: Never    Marital Status: Married  Catering Manager Violence: Not At Risk (11/24/2022)   Humiliation, Afraid, Rape, and Kick questionnaire    Fear of Current or Ex-Partner: No    Emotionally Abused: No    Physically Abused: No    Sexually Abused: No    Past Surgical History:  Procedure Laterality Date   COLONOSCOPY     last one 2019 approx   ELBOW SURGERY Right 2007   FINGER SURGERY     LEFT HEART CATH AND CORONARY ANGIOGRAPHY N/A 05/18/2020   Procedure: LEFT HEART CATH AND CORONARY ANGIOGRAPHY;  Surgeon: Jordan, Peter M, MD;  Location: [facility];  Service: Cardiovascular;  Laterality: N/A;   OPEN REDUCTION INTERNAL FIXATION (ORIF) DISTAL PHALANX Left 01/14/2023   Procedure: REPAIR OF FRACTURE LEFT MIDDLE FINGER AND LEFT RING FINGER. AMPUTATE TIP  OF MIDDLE FINGER;  Surgeon: Lorretta Dess, MD;  Location: [facility];  Service: Plastics;  Laterality: Left;   PROSTATE BIOPSY N/A 01/21/2016   Procedure: BIOPSY TRANSRECTAL ULTRASONIC PROSTATE (TUBP);  Surgeon: Gretel Ferrara, MD;  Location: [facility];  Service: Urology;  Laterality: N/A;   PROSTATE BIOPSY N/A 07/15/2018   Procedure: NEEDLE BIOPSY TRANSRECTAL ULTRASONIC PROSTATE (TUBP);  Surgeon: Ferrara Gretel, MD;  Location: [facility];  Service: Urology;  Laterality: N/A;   PROSTATE BIOPSY N/A 08/13/2020   Procedure: BIOPSY TRANSRECTAL ULTRASONIC PROSTATE (TUBP);  Surgeon: Ferrara Gretel, MD;  Location: [facility];  Service: Urology;  Laterality: N/A;   RADIOACTIVE SEED IMPLANT N/A 04/11/2021   Procedure: RADIOACTIVE SEED IMPLANT/BRACHYTHERAPY IMPLANT WITH CYSTOSCOPY;  Surgeon: Ferrara Gretel, MD;  Location: [facility];  Service: Urology;  Laterality: N/A;   SPACE OAR INSTILLATION N/A 04/11/2021   Procedure: SPACE OAR INSTILLATION;  Surgeon: Ferrara Gretel, MD;  Location: [facility];  Service: Urology;  Laterality: N/A;    Family History  Problem Relation Age of Onset   Heart disease Mother    Hypertension Mother    Glaucoma Mother    Arthritis Father    Heart disease Father    Hypertension Father    Bladder Cancer Father 24   Colon polyps Father    Dementia Brother    Lymphoma Cousin    Prostate cancer Cousin        mother's first cousin   Colon cancer Neg Hx    Esophageal cancer Neg Hx    Rectal cancer Neg Hx    Stomach cancer Neg Hx    Breast cancer Neg Hx     Allergies  Allergen Reactions   Codeine Nausea And Vomiting    Current Outpatient Medications on File Prior to Visit  Medication Sig Dispense Refill   acetaminophen  (TYLENOL ) 500 MG tablet Take 1,000 mg by mouth every 6 (six) hours as needed for moderate pain.     diltiazem  (CARDIZEM ) 30 MG tablet Take 1 tablet (30 mg total) by mouth every 6 (six) hours as needed  (breakthrough palpitations). 120 tablet 2   diltiazem  (CARTIA  XT) 120 MG 24 hr capsule Take 1 capsule (120 mg total) by mouth daily.  Please keep scheduled appointment for future refills 90 capsule 3   isosorbide  mononitrate (IMDUR ) 30 MG 24 hr tablet Take 0.5 tablets (15 mg total) by mouth daily. 45 tablet 3   losartan  (COZAAR ) 50 MG tablet Take 1 tablet (50 mg total) by mouth daily. 90 tablet 3   Multiple Vitamin (MULTIVITAMIN) tablet Take 1 tablet by mouth daily.     rivaroxaban  (XARELTO ) 20 MG TABS tablet Take 1 tablet (20 mg total) by mouth daily with supper. 90 tablet 1   rosuvastatin  (CRESTOR ) 20 MG tablet Take 1 tablet (20 mg total) by mouth daily. 90 tablet 3   solifenacin (VESICARE) 5 MG tablet Take 5 mg by mouth daily.     traZODone  (DESYREL ) 100 MG tablet Take 1 tablet (100 mg total) by mouth at bedtime as needed for sleep. 90 tablet 1   trolamine salicylate (ASPERCREME) 10 % cream Apply 1 application topically as needed for muscle pain.     Turmeric 1053 MG TABS Take 3 tablets by mouth daily.     nitroGLYCERIN  (NITROSTAT ) 0.4 MG SL tablet Place 1 tablet (0.4 mg total) under the tongue every 5 (five) minutes as needed for chest pain. 30 tablet 3   No current facility-administered medications on file prior to visit.    BP 128/80   Pulse 91   Temp (!) 97.5 F (36.4 C) (Oral)   Ht 5' 8.25 (1.734 m)   Wt 195 lb (88.5 kg)   SpO2 99%   BMI 29.43 kg/m       Objective:   Physical Exam Vitals and nursing note reviewed.  Constitutional:      General: He is not in acute distress.    Appearance: Normal appearance. He is not ill-appearing.  HENT:     Head: Normocephalic and atraumatic.     Right Ear: Tympanic membrane, ear canal and external ear normal. There is no impacted cerumen.     Left Ear: Tympanic membrane, ear canal and external ear normal. There is no impacted cerumen.     Nose: Nose normal. No congestion or rhinorrhea.     Mouth/Throat:     Mouth: Mucous membranes  are moist.     Pharynx: Oropharynx is clear.  Eyes:     Extraocular Movements: Extraocular movements intact.     Conjunctiva/sclera: Conjunctivae normal.     Pupils: Pupils are equal, round, and reactive to light.  Neck:     Vascular: No carotid bruit.  Cardiovascular:     Rate and Rhythm: Normal rate and regular rhythm.     Pulses: Normal pulses.     Heart sounds: No murmur heard.    No friction rub. No gallop.  Pulmonary:     Effort: Pulmonary effort is normal.     Breath sounds: Normal breath sounds.  Abdominal:     General: Abdomen is flat. Bowel sounds are normal. There is no distension.     Palpations: Abdomen is soft. There is no mass.     Tenderness: There is no abdominal tenderness. There is no guarding or rebound.     Hernia: No hernia is present.  Musculoskeletal:        General: Normal range of motion.     Cervical back: Normal range of motion and neck supple.  Lymphadenopathy:     Cervical: No cervical adenopathy.  Skin:    General: Skin is warm and dry.     Capillary Refill: Capillary refill takes less than 2 seconds.  Neurological:  General: No focal deficit present.     Mental Status: He is alert and oriented to person, place, and time.  Psychiatric:        Mood and Affect: Mood normal.        Behavior: Behavior normal.        Thought Content: Thought content normal.        Judgment: Judgment normal.        Assessment & Plan:  1. Primary hypertension (Primary) - Well controlled. No change in medication  - Lipid panel; Future - TSH; Future - CBC; Future - Comprehensive metabolic panel; Future  2. Mixed hyperlipidemia - Continue with Crestor   - Lipid panel; Future - TSH; Future - CBC; Future - Comprehensive metabolic panel; Future  3. Persistent atrial fibrillation [facility]) - Per cardiology  - Lipid panel; Future - TSH; Future - CBC; Future - Comprehensive metabolic panel; Future  4. Primary insomnia - Continue with Trazadone 100 mg at  bedtime  - Lipid panel; Future - TSH; Future - CBC; Future - Comprehensive metabolic panel; Future  5. Malignant neoplasm of prostate [facility]) - Per urology   6. OSA (obstructive sleep apnea) - Continue to monitor  - Lipid panel; Future - TSH; Future - CBC; Future - Comprehensive metabolic panel; Future  7. Prediabetes - Consider metformin   - Lipid panel; Future - TSH; Future - CBC; Future - Comprehensive metabolic panel; Future - Hemoglobin A1c; Future  8. Benign prostatic hyperplasia with post-void dribbling - Per urology

## 2023-02-25 NOTE — Patient Instructions (Addendum)
 It was great seeing you today   We will follow up with you regarding your lab work   Please let me know if you need anything

## 2023-02-26 MED ORDER — METFORMIN HCL ER 500 MG PO TB24: 500.0000 mg | ORAL_TABLET | Freq: Every day | ORAL | 3 refills | Status: DC

## 2023-02-26 NOTE — Addendum Note (Signed)
 Addended by: Waymon Amato R on: 02/26/2023 03:28 PM   Modules accepted: Orders

## 2023-03-13 DIAGNOSIS — S62625B Displaced fracture of medial phalanx of left ring finger, initial encounter for open fracture: Secondary | ICD-10-CM | POA: Diagnosis not present

## 2023-03-23 ENCOUNTER — Ambulatory Visit (INDEPENDENT_AMBULATORY_CARE_PROVIDER_SITE_OTHER): Payer: Medicare Other | Admitting: Internal Medicine

## 2023-03-23 ENCOUNTER — Encounter: Payer: Self-pay | Admitting: Internal Medicine

## 2023-03-23 VITALS — BP 114/62 | HR 96 | Ht 68.5 in | Wt 194.2 lb

## 2023-03-23 DIAGNOSIS — E119 Type 2 diabetes mellitus without complications: Secondary | ICD-10-CM

## 2023-03-23 DIAGNOSIS — Z860101 Personal history of adenomatous and serrated colon polyps: Secondary | ICD-10-CM | POA: Diagnosis not present

## 2023-03-23 DIAGNOSIS — Z860100 Personal history of colon polyps, unspecified: Secondary | ICD-10-CM

## 2023-03-23 DIAGNOSIS — R1319 Other dysphagia: Secondary | ICD-10-CM | POA: Diagnosis not present

## 2023-03-23 DIAGNOSIS — Z7984 Long term (current) use of oral hypoglycemic drugs: Secondary | ICD-10-CM

## 2023-03-23 DIAGNOSIS — K219 Gastro-esophageal reflux disease without esophagitis: Secondary | ICD-10-CM | POA: Diagnosis not present

## 2023-03-23 DIAGNOSIS — Z7901 Long term (current) use of anticoagulants: Secondary | ICD-10-CM

## 2023-03-23 MED ORDER — SUTAB 1479-225-188 MG PO TABS: ORAL_TABLET | ORAL | 0 refills | Status: AC

## 2023-03-23 MED ORDER — PANTOPRAZOLE SODIUM 40 MG PO TBEC: 40.0000 mg | DELAYED_RELEASE_TABLET | Freq: Every day | ORAL | 3 refills | Status: DC

## 2023-03-23 NOTE — Progress Notes (Signed)
HISTORY OF PRESENT ILLNESS:  James Edwards is a 73 y.o. male with multiple medical problems as listed below including persistent atrial fibrillation on chronic Xarelto therapy (CHA2DS2-VASc score 3), ejection fraction 55 to 65%, diabetes mellitus on metformin, hypertension, hyperlipidemia, GERD, and a history of adenomatous colon polyps.  Patient presents today regarding surveillance colonoscopy and issues with dysphagia.  First, the patient underwent colonoscopy elsewhere and here 2003, 2007, and 2019.  He has had both tubular adenomas and sessile serrated polyps.  He was due for surveillance around August 2024.  No lower GI complaints.  Next, patient does report a history of GERD for which she had been on omeprazole previously.  States that he has had less in the way of GERD symptoms recently and is no longer on PPI.  However, he has noticed some intermittent solid food dysphagia over the past 3 years.  This has worsened over the past year.  Most recent episode was yesterday.  No weight loss.  Last seen by cardiology January 20, 2023 reviewed.  Blood work from February 25, 2023 shows unremarkable comprehensive metabolic panel and CBC.  Hemoglobin 13.8.  Hemoglobin A1c 6.4.   REVIEW OF SYSTEMS:  All non-GI ROS negative except for fatigue, heart rhythm change, night sweats, excessive urination, urinary leakage  Past Medical History:  Diagnosis Date   BPH without obstruction/lower urinary tract symptoms    Coronary artery disease    cardiologist--- James James Edwards;  cath 05-18-2020  mininmal nonobstructive disease   ED (erectile dysfunction)    GERD (gastroesophageal reflux disease)    no meds   History of 2019 novel coronavirus disease (COVID-19) 2020   per pt asymptomatic   History of melanoma excision 06/2020   per pt localized area mid back   Hyperlipidemia, mixed    Hypertension    followed by pcp   Mild obstructive sleep apnea    followed by James Edwards;   study in epic 09-25-2020,   AHI 8.8/hr,  no cpap   OA (osteoarthritis)    Persistent atrial fibrillation (HCC) 03/2020   cardiologist-- James James Edwards---  on xarelto and bb;  work-up includes TTE, CTA, event monitor, and cardiac cath (results in epic),  pt ef 55-656 per cath and echo 55%   Pre-diabetes    Prostate cancer (HCC) 08/2009   urologist--- James James Edwards--- first dx 07/ 2011,  Gleason 3+3,  active surveillance since;  last bx 06/ 2022  Gleason 7 scheduled for brachtherapy   Wears glasses     Past Surgical History:  Procedure Laterality Date   COLONOSCOPY     last one 2019 approx   ELBOW SURGERY Right 2007   FINGER SURGERY     LEFT HEART CATH AND CORONARY ANGIOGRAPHY N/A 05/18/2020   Procedure: LEFT HEART CATH AND CORONARY ANGIOGRAPHY;  Surgeon: Edwards, James M, MD;  Location: [facility];  Service: Cardiovascular;  Laterality: N/A;   OPEN REDUCTION INTERNAL FIXATION (ORIF) DISTAL PHALANX Left 01/14/2023   Procedure: REPAIR OF FRACTURE LEFT MIDDLE FINGER AND LEFT RING FINGER. AMPUTATE TIP OF MIDDLE FINGER;  Surgeon: James Neu, MD;  Location: [facility];  Service: Plastics;  Laterality: Left;   PROSTATE BIOPSY N/A 01/21/2016   Procedure: BIOPSY TRANSRECTAL ULTRASONIC PROSTATE (TUBP);  Surgeon: James Purpura, MD;  Location: [facility];  Service: Urology;  Laterality: N/A;   PROSTATE BIOPSY N/A 07/15/2018   Procedure: NEEDLE BIOPSY TRANSRECTAL ULTRASONIC PROSTATE (TUBP);  Surgeon: James Purpura, MD;  Location: [facility];  Service: Urology;  Laterality: N/A;   PROSTATE BIOPSY N/A 08/13/2020   Procedure: BIOPSY TRANSRECTAL ULTRASONIC PROSTATE (TUBP);  Surgeon: James Purpura, MD;  Location: [facility];  Service: Urology;  Laterality: N/A;   RADIOACTIVE SEED IMPLANT N/A 04/11/2021   Procedure: RADIOACTIVE SEED IMPLANT/BRACHYTHERAPY IMPLANT WITH CYSTOSCOPY;  Surgeon: James Purpura, MD;  Location: [facility];  Service: Urology;  Laterality: N/A;   SPACE OAR INSTILLATION N/A 04/11/2021    Procedure: SPACE OAR INSTILLATION;  Surgeon: James Purpura, MD;  Location: [facility];  Service: Urology;  Laterality: N/A;    Social History James Edwards  reports that he quit smoking about 16 years ago. His smoking use included cigarettes. He started smoking about 51 years ago. He has a 70 pack-year smoking history. He has never used smokeless tobacco. He reports current alcohol use of about 42.0 standard drinks of alcohol per week. He reports that he does not use drugs.  family history includes Arthritis in his father; Bladder Cancer (age of onset: 96) in his father; Colon polyps in his father; Dementia in his brother; Glaucoma in his mother; Heart disease in his father and mother; Hypertension in his father and mother; Lymphoma in his cousin; Prostate cancer in his cousin.  Allergies  Allergen Reactions   Codeine Nausea And Vomiting       PHYSICAL EXAMINATION: Vital signs: BP 114/62   Pulse 96   Ht 5' 8.5" (1.74 Edwards)   Wt 194 lb 4 oz (88.1 kg)   SpO2 98%   BMI 29.11 kg/Edwards   Constitutional: generally well-appearing, no acute distress Psychiatric: alert and oriented x3, cooperative Eyes: extraocular movements intact, anicteric, conjunctiva pink Mouth: oral pharynx moist, no lesions Neck: supple no lymphadenopathy Cardiovascular: heart irregular rate and rhythm, no murmur Lungs: clear to auscultation bilaterally Abdomen: soft, nontender, nondistended, no obvious ascites, no peritoneal signs, normal bowel sounds, no organomegaly Rectal: Deferred until colonoscopy Extremities: no clubbing, cyanosis, or lower extremity edema bilaterally Skin: no lesions on visible extremities Neuro: No focal deficits.  Cranial nerves intact  ASSESSMENT:  1.  History of multiple adenomatous and sessile serrated polyps.  Due for surveillance colonoscopy.  Previous examinations 2003, 2007, 2019. 2.  Intermittent solid food dysphagia.  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   GERD.  Currently on no PPI. 4.  Multiple medical problems including diabetes on metformin and persistent atrial fibrillation on Xarelto   PLAN:  1.  Schedule surveillance colonoscopy.  Patient is high risk given his comorbidities and the need to address his Xarelto and metformin.The nature of the procedure, as well as the risks, benefits, and alternatives were carefully and thoroughly reviewed with the patient. Ample time for discussion and questions allowed. The patient understood, was satisfied, and agreed to proceed. 2.  Schedule upper endoscopy with probable esophageal dilation.  High risk as above.The nature of the procedure, as well as the risks, benefits, and alternatives were carefully and thoroughly reviewed with the patient. Ample time for discussion and questions allowed. The patient understood, was satisfied, and agreed to proceed. 3.  Prescribe pantoprazole 40 mg daily.  Medication risks reviewed. 4.  Hold metformin the day of the procedure to avoid unwanted hypoglycemia 5.  Hold Xarelto 3 days prior to the procedure as he will likely require dilation.  I will reach out to his cardiologist to make sure this is susceptible.  I suspect this will be just fine. A total time of 60 minutes was spent preparing to see the patient,  reviewing a myriad of data, obtaining comprehensive history, performing medically appropriate physical examination, ordering multiple procedures, including therapeutic, counseling and educating the patient regarding the above listed issues, ordering medication, reaching out to cardiology, and documenting clinical information in the health record.

## 2023-03-23 NOTE — Patient Instructions (Signed)
 We have sent the following medications to your pharmacy for you to pick up at your convenience:  Pantoprazole.  You have been scheduled for an endoscopy and colonoscopy. Please follow the written instructions given to you at your visit today.  Please pick up your prep supplies at the pharmacy within the next 1-3 days.  If you use inhalers (even only as needed), please bring them with you on the day of your procedure.  DO NOT TAKE 7 DAYS PRIOR TO TEST- Trulicity (dulaglutide) Ozempic, Wegovy (semaglutide) Mounjaro (tirzepatide) Bydureon Bcise (exanatide extended release)  DO NOT TAKE 1 DAY PRIOR TO YOUR TEST Rybelsus (semaglutide) Adlyxin (lixisenatide) Victoza (liraglutide) Byetta (exanatide) ___________________________________________________________________________

## 2023-04-06 ENCOUNTER — Ambulatory Visit: Payer: Medicare Other | Admitting: Internal Medicine

## 2023-04-22 ENCOUNTER — Telehealth: Payer: Self-pay

## 2023-04-22 NOTE — Telephone Encounter (Signed)
 Patient with diagnosis of afib on Xarelto for anticoagulation.    Procedure: EGD, colonoscopy  Date of procedure: 04/30/23   CHA2DS2-VASc Score = 3   This indicates a 3.2% annual risk of stroke. The patient's score is based upon: CHF History: 0 HTN History: 1 Diabetes History: 0 Stroke History: 0 Vascular Disease History: 1 Age Score: 1 Gender Score: 0      CrCl 87 ml/min Platelet count 225  Per office protocol, patient can hold Eliquis for 2 days prior to procedure.     **This guidance is not considered finalized until pre-operative APP has relayed final recommendations.**

## 2023-04-22 NOTE — Telephone Encounter (Addendum)
   Patient Name: James Edwards  DOB: 1950-02-20 MRN: [id number]  Primary Cardiologist: Donato Schultz, MD  Clinical pharmacists have reviewed the patient's past medical history, labs, and current medications as part of preoperative protocol coverage. The following recommendations have been made:  Per office protocol, patient can hold Xarelto for 2 days prior to procedure.     I will route this recommendation to the requesting party via Epic fax function and remove from pre-op pool.  Please call with questions.  Napoleon Form, Leodis Rains, NP 04/22/2023, 5:00 PM

## 2023-04-22 NOTE — Telephone Encounter (Signed)
 [facility] Pre-operative Risk Assessment     Request for surgical clearance:     Endoscopy Procedure  What type of surgery is being performed?     EGD, colonoscopy  When is this surgery scheduled?   04/30/2023    What type of clearance is required ?   Pharmacy  Are there any medications that need to be held prior to surgery and how long? Xarelto, 3 days  Practice name and name of physician performing surgery?      [facility]  What is your office phone and fax number?      Phone- [phone]  Fax- [phone]  Anesthesia type (None, local, MAC, general) ?       MAC   Please route your response to Alonna Buckler, CMA

## 2023-04-22 NOTE — Telephone Encounter (Signed)
 Ed called in to inquire when to hold his Xarelto .  I don't see where the clearance letter got sent so I am resending it ASAP. I told Ed we will call him back.

## 2023-04-23 NOTE — Telephone Encounter (Signed)
 Patient given permission to hold Xarelto for 2 days prior to procedure.  James Edwards to call him and let him know

## 2023-04-23 NOTE — Telephone Encounter (Signed)
 I have left James Edwards a detailed message to hold his Xarelto 2 days prior to his procedure. I told him to please call back so I can confirm that he got my message.

## 2023-04-23 NOTE — Telephone Encounter (Signed)
 Ed called me back and confirmed he got my message and verbalized understanding.

## 2023-04-30 ENCOUNTER — Encounter: Payer: Self-pay | Admitting: Internal Medicine

## 2023-04-30 ENCOUNTER — Ambulatory Visit: Payer: Medicare Other | Admitting: Internal Medicine

## 2023-04-30 VITALS — BP 111/67 | HR 116 | Temp 97.2°F | Resp 18 | Ht 68.5 in | Wt 194.4 lb

## 2023-04-30 DIAGNOSIS — K449 Diaphragmatic hernia without obstruction or gangrene: Secondary | ICD-10-CM

## 2023-04-30 DIAGNOSIS — R1319 Other dysphagia: Secondary | ICD-10-CM

## 2023-04-30 DIAGNOSIS — D120 Benign neoplasm of cecum: Secondary | ICD-10-CM | POA: Diagnosis not present

## 2023-04-30 DIAGNOSIS — K219 Gastro-esophageal reflux disease without esophagitis: Secondary | ICD-10-CM | POA: Diagnosis not present

## 2023-04-30 DIAGNOSIS — D121 Benign neoplasm of appendix: Secondary | ICD-10-CM

## 2023-04-30 DIAGNOSIS — K5730 Diverticulosis of large intestine without perforation or abscess without bleeding: Secondary | ICD-10-CM

## 2023-04-30 DIAGNOSIS — Z1211 Encounter for screening for malignant neoplasm of colon: Secondary | ICD-10-CM | POA: Diagnosis not present

## 2023-04-30 DIAGNOSIS — K648 Other hemorrhoids: Secondary | ICD-10-CM | POA: Diagnosis not present

## 2023-04-30 DIAGNOSIS — K222 Esophageal obstruction: Secondary | ICD-10-CM | POA: Diagnosis not present

## 2023-04-30 DIAGNOSIS — R1310 Dysphagia, unspecified: Secondary | ICD-10-CM | POA: Diagnosis not present

## 2023-04-30 DIAGNOSIS — K635 Polyp of colon: Secondary | ICD-10-CM | POA: Diagnosis not present

## 2023-04-30 DIAGNOSIS — Q399 Congenital malformation of esophagus, unspecified: Secondary | ICD-10-CM | POA: Diagnosis not present

## 2023-04-30 DIAGNOSIS — K31819 Angiodysplasia of stomach and duodenum without bleeding: Secondary | ICD-10-CM

## 2023-04-30 DIAGNOSIS — I10 Essential (primary) hypertension: Secondary | ICD-10-CM | POA: Diagnosis not present

## 2023-04-30 DIAGNOSIS — Z860100 Personal history of colon polyps, unspecified: Secondary | ICD-10-CM

## 2023-04-30 DIAGNOSIS — D122 Benign neoplasm of ascending colon: Secondary | ICD-10-CM | POA: Diagnosis not present

## 2023-04-30 DIAGNOSIS — R7303 Prediabetes: Secondary | ICD-10-CM | POA: Diagnosis not present

## 2023-04-30 DIAGNOSIS — Z7901 Long term (current) use of anticoagulants: Secondary | ICD-10-CM | POA: Diagnosis not present

## 2023-04-30 DIAGNOSIS — I4891 Unspecified atrial fibrillation: Secondary | ICD-10-CM | POA: Diagnosis not present

## 2023-04-30 MED ORDER — SODIUM CHLORIDE 0.9 % IV SOLN: 500.0000 mL | Freq: Once | INTRAVENOUS | Status: DC

## 2023-04-30 NOTE — Progress Notes (Signed)
 Expand All Collapse All HISTORY OF PRESENT ILLNESS:   James Edwards is a 73 y.o. male with multiple medical problems as listed below including persistent atrial fibrillation on chronic Xarelto therapy (CHA2DS2-VASc score 3), ejection fraction 55 to 65%, diabetes mellitus on metformin, hypertension, hyperlipidemia, GERD, and a history of adenomatous colon polyps.  Patient presents today regarding surveillance colonoscopy and issues with dysphagia.   First, the patient underwent colonoscopy elsewhere and here 2003, 2007, and 2019.  He has had both tubular adenomas and sessile serrated polyps.  He was due for surveillance around August 2024.  No lower GI complaints.   Next, patient does report a history of GERD for which she had been on omeprazole previously.  States that he has had less in the way of GERD symptoms recently and is no longer on PPI.  However, he has noticed some intermittent solid food dysphagia over the past 3 years.  This has worsened over the past year.  Most recent episode was yesterday.  No weight loss.   Last seen by cardiology January 20, 2023 reviewed.   Blood work from February 25, 2023 shows unremarkable comprehensive metabolic panel and CBC.  Hemoglobin 13.8.  Hemoglobin A1c 6.4.    REVIEW OF SYSTEMS:   All non-GI ROS negative except for fatigue, heart rhythm change, night sweats, excessive urination, urinary leakage       Past Medical History:  Diagnosis Date   BPH without obstruction/lower urinary tract symptoms     Coronary artery disease      cardiologist--- dr Shari Prows;  cath 05-18-2020  mininmal nonobstructive disease   ED (erectile dysfunction)     GERD (gastroesophageal reflux disease)      no meds   History of 2019 novel coronavirus disease (COVID-19) 2020    per pt asymptomatic   History of melanoma excision 06/2020    per pt localized area mid back   Hyperlipidemia, mixed     Hypertension      followed by pcp   Mild obstructive sleep apnea       followed by dr young;   study in epic 09-25-2020,  AHI 8.8/hr,  no cpap   OA (osteoarthritis)     Persistent atrial fibrillation (HCC) 03/2020    cardiologist-- dr h. Shari Prows---  on xarelto and bb;  work-up includes TTE, CTA, event monitor, and cardiac cath (results in epic),  pt ef 55-656 per cath and echo 55%   Pre-diabetes     Prostate cancer (HCC) 08/2009    urologist--- dr Laverle Patter--- first dx 07/ 2011,  Gleason 3+3,  active surveillance since;  last bx 06/ 2022  Gleason 7 scheduled for brachtherapy   Wears glasses                 Past Surgical History:  Procedure Laterality Date   COLONOSCOPY        last one 2019 approx   ELBOW SURGERY Right 2007   FINGER SURGERY       LEFT HEART CATH AND CORONARY ANGIOGRAPHY N/A 05/18/2020    Procedure: LEFT HEART CATH AND CORONARY ANGIOGRAPHY;  Surgeon: Swazil[place], Peter M, MD;  Location: [facility] INVASIVE CV LAB;  Service: Cardiovascular;  Laterality: N/A;   OPEN REDUCTION INTERNAL FIXATION (ORIF) DISTAL PHALANX Left 01/14/2023    Procedure: REPAIR OF FRACTURE LEFT MIDDLE FINGER AND LEFT RING FINGER. AMPUTATE TIP OF MIDDLE FINGER;  Surgeon: Knute Neu, MD;  Location: [facility];  Service: Plastics;  Laterality: Left;   PROSTATE BIOPSY  N/A 01/21/2016    Procedure: BIOPSY TRANSRECTAL ULTRASONIC PROSTATE (TUBP);  Surgeon: Heloise Purpura, MD;  Location: [facility];  Service: Urology;  Laterality: N/A;   PROSTATE BIOPSY N/A 07/15/2018    Procedure: NEEDLE BIOPSY TRANSRECTAL ULTRASONIC PROSTATE (TUBP);  Surgeon: Heloise Purpura, MD;  Location: [facility];  Service: Urology;  Laterality: N/A;   PROSTATE BIOPSY N/A 08/13/2020    Procedure: BIOPSY TRANSRECTAL ULTRASONIC PROSTATE (TUBP);  Surgeon: Heloise Purpura, MD;  Location: [facility];  Service: Urology;  Laterality: N/A;   RADIOACTIVE SEED IMPLANT N/A 04/11/2021    Procedure: RADIOACTIVE SEED IMPLANT/BRACHYTHERAPY IMPLANT WITH CYSTOSCOPY;  Surgeon: Heloise Purpura, MD;  Location: [facility];  Service: Urology;  Laterality: N/A;   SPACE OAR INSTILLATION N/A 04/11/2021    Procedure: SPACE OAR INSTILLATION;  Surgeon: Heloise Purpura, MD;  Location: [facility];  Service: Urology;  Laterality: N/A;          Social History ETHEL VERONICA [name]  reports that he quit smoking about 16 years ago. His smoking use included cigarettes. He started smoking about 51 years ago. He has a 70 pack-year smoking history. He has never used smokeless tobacco. He reports current alcohol use of about 42.0 standard drinks of alcohol per week. He reports that he does not use drugs.   family history includes Arthritis in his father; Bladder Cancer (age of onset: 66) in his father; Colon polyps in his father; Dementia in his brother; Glaucoma in his mother; Heart disease in his father and mother; Hypertension in his father and mother; Lymphoma in his cousin; Prostate cancer in his cousin.   Allergies      Allergies  Allergen Reactions   Codeine Nausea And Vomiting            PHYSICAL EXAMINATION: Vital signs: BP 114/62   Pulse 96   Ht 5' 8.5" (1.74 m)   Wt 194 lb 4 oz (88.1 kg)   SpO2 98%   BMI 29.11 kg/m   Constitutional: generally well-appearing, no acute distress Psychiatric: alert and oriented x3, cooperative Eyes: extraocular movements intact, anicteric, conjunctiva pink Mouth: oral pharynx moist, no lesions Neck: supple no lymphadenopathy Cardiovascular: heart irregular rate and rhythm, no murmur Lungs: clear to auscultation bilaterally Abdomen: soft, nontender, nondistended, no obvious ascites, no peritoneal signs, normal bowel sounds, no organomegaly Rectal: Deferred until colonoscopy Extremities: no clubbing, cyanosis, or lower extremity edema bilaterally Skin: no lesions on visible extremities Neuro: No focal deficits.  Cranial nerves intact   ASSESSMENT:   1.  History of multiple adenomatous and sessile serrated polyps.  Due for surveillance colonoscopy.   Previous examinations 2003, 2007, 2019. 2.  Intermittent solid food dysphagia.  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  GERD.  Currently on no PPI. 4.  Multiple medical problems including diabetes on metformin and persistent atrial fibrillation on Xarelto     PLAN:   1.  Schedule surveillance colonoscopy.  Patient is high risk given his comorbidities and the need to address his Xarelto and metformin.The nature of the procedure, as well as the risks, benefits, and alternatives were carefully and thoroughly reviewed with the patient. Ample time for discussion and questions allowed. The patient understood, was satisfied, and agreed to proceed. 2.  Schedule upper endoscopy with probable esophageal dilation.  High risk as above.The nature of the procedure, as well as the risks, benefits, and alternatives were carefully and thoroughly reviewed with the patient. Ample time for discussion and questions allowed. The patient  understood, was satisfied, and agreed to proceed. 3.  Prescribe pantoprazole 40 mg daily.  Medication risks reviewed. 4.  Hold metformin the day of the procedure to avoid unwanted hypoglycemia 5.  Hold Xarelto 3 days prior to the procedure as he will likely require dilation.  I will reach out to his cardiologist to make sure this is susceptible.  I suspect this will be just fine.

## 2023-04-30 NOTE — Progress Notes (Signed)
 Patient reports no changes to his medical or surgery history since his last office visit.

## 2023-04-30 NOTE — Progress Notes (Signed)
 Sedate, gd SR, tolerated procedure well, VSS, report to RN

## 2023-04-30 NOTE — Op Note (Signed)
 [facility] Patient Name: James Edwards Procedure Date: 04/30/2023 11:15 AM MRN: [id number] Endoscopist: Wilhemina Bonito. Marina Goodell , MD, [id number] Age: 73 Referring MD:  Date of Birth: Apr 04, 1950 Gender: Male Account #: [id number] Procedure:                Upper GI endoscopy with balloon dilation of the                            esophagus?"18 mm max; biopsies Indications:              Therapeutic procedure, Dysphagia, GERD Medicines:                Monitored Anesthesia Care Procedure:                Pre-Anesthesia Assessment:                           - Prior to the procedure, a History and Physical                            was performed, and patient medications and                            allergies were reviewed. The patient's tolerance of                            previous anesthesia was also reviewed. The risks                            and benefits of the procedure and the sedation                            options and risks were discussed with the patient.                            All questions were answered, and informed consent                            was obtained. Prior Anticoagulants: The patient has                            taken Xarelto (rivaroxaban), last dose was 2 days                            prior to procedure. ASA Grade Assessment: III - A                            patient with severe systemic disease. After                            reviewing the risks and benefits, the patient was                            deemed in satisfactory condition to undergo the  procedure.                           After obtaining informed consent, the endoscope was                            passed under direct vision. Throughout the                            procedure, the patient's blood pressure, pulse, and                            oxygen saturations were monitored continuously. The                            GIF [id number] #[id number] was  introduced through the                            mouth, and advanced to the second part of duodenum.                            The upper GI endoscopy was accomplished without                            difficulty. The patient tolerated the procedure                            well. Scope In: Scope Out: Findings:                 The esophagus was somewhat tortuous. At the                            gastroesophageal junction there was a ringlike                            peptic stricture without inflammation or Barrett's.                            This was located at 36 cm from the incisors. A TTS                            dilator was passed through the scope. Dilation with                            an 18-19-20 mm balloon dilator was performed to 18                            mm. The ringlike stricture was disrupted.                           The stomach revealed a large hiatal hernia. The                            antral mucosa was  hyperemic.Marland Kitchen Biopsies were taken                            with a cold forceps for histology.                           The examined duodenum was normal except for a                            diminutive nonbleeding AVM in the second portion.                            See images.                           The cardia and gastric fundus were normal on                            retroflexion. Complications:            No immediate complications. Estimated Blood Loss:     Estimated blood loss: none. Impression:               1. GERD with esophageal stricture status post                            dilation                           2. Large hiatal hernia                           3. Gastric erythema status post biopsies                           4. Incidental diminutive duodenal AVM. Recommendation:           - Patient has a contact number available for                            emergencies. The signs and symptoms of potential                             delayed complications were discussed with the                            patient. Return to normal activities tomorrow.                            Written discharge instructions were provided to the                            patient.                           - Resume previous diet.                           -  Continue present medications. Continue recently                            prescribed pantoprazole daily.                           ?" Resume Xarelto today                           - Await pathology results.                           -Office follow-up with Dr. Marina Goodell in about 6-8 weeks Wilhemina Bonito. Marina Goodell, MD 04/30/2023 12:09:28 PM This report has been signed electronically.

## 2023-04-30 NOTE — Progress Notes (Signed)
 Called to room to assist during endoscopic procedure.  Patient ID and intended procedure confirmed with present staff. Received instructions for my participation in the procedure from the performing physician.

## 2023-04-30 NOTE — Op Note (Signed)
 [facility] Patient Name: James Edwards Procedure Date: 04/30/2023 11:21 AM MRN: [id number] Endoscopist: Wilhemina Bonito. Marina Goodell , MD, [id number] Age: 73 Referring MD:  Date of Birth: [birth year] Gender: Male Account #: [id number] Procedure:                Colonoscopy with cold snare polypectomy x 1; biopsy                            polypectomy x 1 Indications:              High risk colon cancer surveillance: Personal                            history of non-advanced adenoma, High risk colon                            cancer surveillance: Personal history of sessile                            serrated colon polyp (less than 10 mm in size) with                            no dysplasia. Previous examinations 2003, 2007, 2019 Medicines:                Monitored Anesthesia Care Procedure:                Pre-Anesthesia Assessment:                           - Prior to the procedure, a History and Physical                            was performed, and patient medications and                            allergies were reviewed. The patient's tolerance of                            previous anesthesia was also reviewed. The risks                            and benefits of the procedure and the sedation                            options and risks were discussed with the patient.                            All questions were answered, and informed consent                            was obtained. Prior Anticoagulants: The patient has                            taken Xarelto (rivaroxaban), last dose was 2 days  prior to procedure. ASA Grade Assessment: III - A                            patient with severe systemic disease. After                            reviewing the risks and benefits, the patient was                            deemed in satisfactory condition to undergo the                            procedure.                           After obtaining informed  consent, the colonoscope                            was passed under direct vision. Throughout the                            procedure, the patient's blood pressure, pulse, and                            oxygen saturations were monitored continuously. The                            Olympus Scope SN: [id number] was introduced through                            the anus and advanced to the the cecum, identified                            by appendiceal orifice and ileocecal valve. The                            ileocecal valve, appendiceal orifice, and rectum                            were photographed. The quality of the bowel                            preparation was excellent. The colonoscopy was                            performed without difficulty. The patient tolerated                            the procedure well. The bowel preparation used was                            SUPREP via split dose instruction. Scope In: 11:29:30 AM Scope Out: 11:45:45 AM Scope Withdrawal Time: 0 hours 12 minutes 59 seconds  Total Procedure Duration: 0 hours  16 minutes 15 seconds  Findings:                 A 4 mm polyp was found in the appendiceal orifice.                            The polyp was sessile. The polyp was removed with a                            cold snare. Resection and retrieval were complete.                           A 1 mm polyp was found in the ascending colon. The                            polyp was removed with a jumbo cold forceps.                            Resection and retrieval were complete.                           Many diverticula were found in the entire colon.                           Internal hemorrhoids were found during retroflexion.                           The exam was otherwise without abnormality on                            direct and retroflexion views. Complications:            No immediate complications. Estimated blood loss:                             None. Estimated Blood Loss:     Estimated blood loss: none. Impression:               - One 4 mm polyp at the appendiceal orifice,                            removed with a cold snare. Resected and retrieved.                           - One 1 mm polyp in the ascending colon, removed                            with a jumbo cold forceps. Resected and retrieved.                           - Diverticulosis in the entire examined colon.                           - Internal hemorrhoids.                           -  The examination was otherwise normal on direct                            and retroflexion views. Recommendation:           - Repeat colonoscopy in 5 years for surveillance.                           - Resume Xarelto (rivaroxaban) today at prior dose.                           - Patient has a contact number available for                            emergencies. The signs and symptoms of potential                            delayed complications were discussed with the                            patient. Return to normal activities tomorrow.                            Written discharge instructions were provided to the                            patient.                           - Resume previous diet.                           - Continue present medications.                           - Await pathology results. Wilhemina Bonito. Marina Goodell, MD 04/30/2023 12:04:20 PM This report has been signed electronically.

## 2023-04-30 NOTE — Patient Instructions (Signed)
 - Resume previous diet. - Continue present medications. Continue recently prescribed pantoprazole daily. - Resume Xarelto today - Await pathology results. -Office follow-up with Dr. Marina Goodell in about 6-8 weeks   YOU HAD AN ENDOSCOPIC PROCEDURE TODAY AT THE [facility]:   Refer to the procedure report that was given to you for any specific questions about what was found during the examination.  If the procedure report does not answer your questions, please call your gastroenterologist to clarify.  If you requested that your care partner not be given the details of your procedure findings, then the procedure report has been included in a sealed envelope for you to review at your convenience later.  YOU SHOULD EXPECT: Some feelings of bloating in the abdomen. Passage of more gas than usual.  Walking can help get rid of the air that was put into your GI tract during the procedure and reduce the bloating. If you had a lower endoscopy (such as a colonoscopy or flexible sigmoidoscopy) you may notice spotting of blood in your stool or on the toilet paper. If you underwent a bowel prep for your procedure, you may not have a normal bowel movement for a few days.  Please Note:  You might notice some irritation and congestion in your nose or some drainage.  This is from the oxygen used during your procedure.  There is no need for concern and it should clear up in a day or so.  SYMPTOMS TO REPORT IMMEDIATELY:  Following lower endoscopy (colonoscopy or flexible sigmoidoscopy):  Excessive amounts of blood in the stool  Significant tenderness or worsening of abdominal pains  Swelling of the abdomen that is new, acute  Fever of 100F or higher  Following upper endoscopy (EGD)  Vomiting of blood or coffee ground material  New chest pain or pain under the shoulder blades  Painful or persistently difficult swallowing  New shortness of breath  Fever of 100F or higher  Black, tarry-looking  stools  For urgent or emergent issues, a gastroenterologist can be reached at any hour by calling [phone]. Do not use MyChart messaging for urgent concerns.    DIET:  We do recommend a small meal at first, but then you may proceed to your regular diet.  Drink plenty of fluids but you should avoid alcoholic beverages for 24 hours.  ACTIVITY:  You should plan to take it easy for the rest of today and you should NOT DRIVE or use heavy machinery until tomorrow (because of the sedation medicines used during the test).    FOLLOW UP: Our staff will call the number listed on your records the next business day following your procedure.  We will call around 7:15- 8:00 am to check on you and address any questions or concerns that you may have regarding the information given to you following your procedure. If we do not reach you, we will leave a message.     If any biopsies were taken you will be contacted by phone or by letter within the next 1-3 weeks.  Please call us at [phone] if you have not heard about the biopsies in 3 weeks.    SIGNATURES/CONFIDENTIALITY: You and/or your care partner have signed paperwork which will be entered into your electronic medical record.  These signatures attest to the fact that that the information above on your After Visit Summary has been reviewed and is understood.  Full responsibility of the confidentiality of this discharge information lies with you and/or your  care-partner.

## 2023-05-01 ENCOUNTER — Telehealth: Payer: Self-pay

## 2023-05-01 NOTE — Telephone Encounter (Signed)
  Follow up Call-     04/30/2023   10:45 AM  Call back number  Post procedure Call Back phone  # [phone]  Permission to leave phone message Yes     Patient questions:  Do you have a fever, pain , or abdominal swelling? No. Pain Score  0 *  Have you tolerated food without any problems? Yes.    Have you been able to return to your normal activities? Yes.    Do you have any questions about your discharge instructions: Diet   No. Medications  No. Follow up visit  No.  Do you have questions or concerns about your Care? No.  Actions: * If pain score is 4 or above: No action needed, pain <4.

## 2023-05-04 ENCOUNTER — Encounter: Payer: Self-pay | Admitting: Internal Medicine

## 2023-05-04 LAB — SURGICAL PATHOLOGY

## 2023-05-19 ENCOUNTER — Other Ambulatory Visit: Payer: Self-pay | Admitting: Adult Health

## 2023-05-19 DIAGNOSIS — F339 Major depressive disorder, recurrent, unspecified: Secondary | ICD-10-CM

## 2023-05-26 ENCOUNTER — Encounter: Payer: Self-pay | Admitting: Adult Health

## 2023-05-26 ENCOUNTER — Ambulatory Visit (INDEPENDENT_AMBULATORY_CARE_PROVIDER_SITE_OTHER): Payer: Medicare Other | Admitting: Adult Health

## 2023-05-26 VITALS — BP 120/80 | HR 74 | Temp 98.0°F | Ht 68.5 in | Wt 199.0 lb

## 2023-05-26 DIAGNOSIS — R7303 Prediabetes: Secondary | ICD-10-CM

## 2023-05-26 DIAGNOSIS — Z8349 Family history of other endocrine, nutritional and metabolic diseases: Secondary | ICD-10-CM

## 2023-05-26 DIAGNOSIS — I10 Essential (primary) hypertension: Secondary | ICD-10-CM

## 2023-05-26 LAB — CBC
HCT: 40.7 % (ref 39.0–52.0)
Hemoglobin: 13.8 g/dL (ref 13.0–17.0)
MCHC: 33.9 g/dL (ref 30.0–36.0)
MCV: 97.5 fl (ref 78.0–100.0)
Platelets: 253 10*3/uL (ref 150.0–400.0)
RBC: 4.18 Mil/uL — ABNORMAL LOW (ref 4.22–5.81)
RDW: 12.6 % (ref 11.5–15.5)
WBC: 6.7 10*3/uL (ref 4.0–10.5)

## 2023-05-26 LAB — POCT GLYCOSYLATED HEMOGLOBIN (HGB A1C): Hemoglobin A1C: 5.8 % — AB (ref 4.0–5.6)

## 2023-05-26 LAB — IBC + FERRITIN
Ferritin: 47.2 ng/mL (ref 22.0–322.0)
Iron: 80 ug/dL (ref 42–165)
Saturation Ratios: 31.4 % (ref 20.0–50.0)
TIBC: 254.8 ug/dL (ref 250.0–450.0)
Transferrin: 182 mg/dL — ABNORMAL LOW (ref 212.0–360.0)

## 2023-05-26 LAB — HEPATIC FUNCTION PANEL
ALT: 19 U/L (ref 0–53)
AST: 17 U/L (ref 0–37)
Albumin: 4.2 g/dL (ref 3.5–5.2)
Alkaline Phosphatase: 35 U/L — ABNORMAL LOW (ref 39–117)
Bilirubin, Direct: 0.2 mg/dL (ref 0.0–0.3)
Total Bilirubin: 0.6 mg/dL (ref 0.2–1.2)
Total Protein: 6.7 g/dL (ref 6.0–8.3)

## 2023-05-26 NOTE — Progress Notes (Signed)
 Subjective:    Patient ID: James Edwards, male    DOB: 03/19/1950, 73 y.o.   MRN: [id number]  HPI 73 year old male who  has a past medical history of BPH without obstruction/lower urinary tract symptoms, Coronary artery disease, ED (erectile dysfunction), GERD (gastroesophageal reflux disease), History of 2019 novel coronavirus disease (COVID-19) (2020), History of melanoma excision (06/2020), Hyperlipidemia, mixed, Hypertension, Mild obstructive sleep apnea, OA (osteoarthritis), Persistent atrial fibrillation (HCC) (03/2020), Pre-diabetes, Prostate cancer (HCC) (08/2009), and Wears glasses.  He presents to the office today for three month follow up regarding prediabetes and hypertension.  During his physical in January 2025 it was noted that his A1c was 6.4.  He was started on metformin 500 mg extended release daily and advised to work on lifestyle modifications.  He has been tolerating metformin well with no side effects. He has been watching his diet and is walking. ]  Lab Results  Component Value Date   HGBA1C 5.8 (A) 05/26/2023   HGBA1C 6.4 02/25/2023   HGBA1C 6.1 (H) 01/24/2022   Wt Readings from Last 3 Encounters:  05/26/23 199 lb (90.3 kg)  04/30/23 194 lb 6.4 oz (88.2 kg)  03/23/23 194 lb 4 oz (88.1 kg)   HTN -managed with diltiazem 120 mg daily and losartan 50 mg daily.Marland Kitchen  He does monitor blood pressure at home and reports readings in the 120s to 140s over 70s to 80s consistently.  BP Readings from Last 3 Encounters:  05/26/23 120/80  04/30/23 111/67  03/23/23 114/62   Family history of Hemochromatosis. - He found out that he has a 7 year old son. 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 He would like to check his iron levels today   Review of Systems See HPI   Past Medical History:  Diagnosis Date   BPH without obstruction/lower urinary tract symptoms    Coronary artery disease    cardiologist--- dr Shari Prows;  cath 05-18-2020  mininmal  nonobstructive disease   ED (erectile dysfunction)    GERD (gastroesophageal reflux disease)    no meds   History of 2019 novel coronavirus disease (COVID-19) 2020   per pt asymptomatic   History of melanoma excision 06/2020   per pt localized area mid back   Hyperlipidemia, mixed    Hypertension    followed by pcp   Mild obstructive sleep apnea    followed by dr young;   study in epic 09-25-2020,  AHI 8.8/hr,  no cpap   OA (osteoarthritis)    Persistent atrial fibrillation (HCC) 03/2020   cardiologist-- dr h. Shari Prows---  on xarelto and bb;  work-up includes TTE, CTA, event monitor, and cardiac cath (results in epic),  pt ef 55-656 per cath and echo 55%   Pre-diabetes    Prostate cancer (HCC) 08/2009   urologist--- dr Laverle Patter--- first dx 07/ 2011,  Gleason 3+3,  active surveillance since;  last bx 06/ 2022  Gleason 7 scheduled for brachtherapy   Wears glasses     Social History   Socioeconomic History   Marital status: Married    Spouse name: Not on file   Number of children: 1   Years of education: Not on file   Highest education level: Bachelor's degree (e.g., BA, AB, BS)  Occupational History   Not on file  Tobacco Use   Smoking status: Former    Current packs/day: 0.00    Average packs/day: 2.0 packs/day for 35.0 years (70.0 ttl pk-yrs)  Types: Cigarettes    Start date: 26    Quit date: 2009    Years since quitting: 16.2   Smokeless tobacco: Never  Vaping Use   Vaping status: Never Used  Substance and Sexual Activity   Alcohol use: Yes    Alcohol/week: 42.0 standard drinks of alcohol    Types: 42 Cans of beer per week    Comment: 6pk beer per day  (12 oz each)   Drug use: Never   Sexual activity: Not on file  Other Topics Concern   Not on file  Social History Narrative   Lives in [place] with wife.   Retired.  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    Two Children who both live locally.       He likes  to sleep.    Social Drivers of Corporate investment banker Strain: Low Risk  (02/23/2023)   Overall Financial Resource Strain (CARDIA)    Difficulty of Paying Living Expenses: Not hard at all  Food Insecurity: No Food Insecurity (02/23/2023)   Hunger Vital Sign    Worried About Running Out of Food in the Last Year: Never true    Ran Out of Food in the Last Year: Never true  Transportation Needs: No Transportation Needs (02/23/2023)   PRAPARE - Administrator, Civil Service (Medical): No    Lack of Transportation (Non-Medical): No  Physical Activity: Sufficiently Active (02/23/2023)   Exercise Vital Sign    Days of Exercise per Week: 7 days    Minutes of Exercise per Session: 30 min  Stress: Stress Concern Present (02/23/2023)   [employer] of Occupational Health - Occupational Stress Questionnaire    Feeling of Stress : To some extent  Social Connections: Moderately Isolated (02/23/2023)   Social Connection and Isolation Panel [NHANES]    Frequency of Communication with Friends and Family: Patient declined    Frequency of Social Gatherings with Friends and Family: More than three times a week    Attends Religious Services: Never    Database administrator or Organizations: No    Attends Banker Meetings: Never    Marital Status: Married  Catering manager Violence: Not At Risk (11/24/2022)   Humiliation, Afraid, Rape, and Kick questionnaire    Fear of Current or Ex-Partner: No    Emotionally Abused: No    Physically Abused: No    Sexually Abused: No    Past Surgical History:  Procedure Laterality Date   COLONOSCOPY     last one 2019 approx   ELBOW SURGERY Right 2007   FINGER SURGERY     LEFT HEART CATH AND CORONARY ANGIOGRAPHY N/A 05/18/2020   Procedure: LEFT HEART CATH AND CORONARY ANGIOGRAPHY;  Surgeon: Swaziland, Peter M, MD;  Location: [facility];  Service: Cardiovascular;  Laterality: N/A;   OPEN REDUCTION INTERNAL FIXATION (ORIF) DISTAL PHALANX  Left 01/14/2023   Procedure: REPAIR OF FRACTURE LEFT MIDDLE FINGER AND LEFT RING FINGER. AMPUTATE TIP OF MIDDLE FINGER;  Surgeon: Knute Neu, MD;  Location: [facility];  Service: Plastics;  Laterality: Left;   PROSTATE BIOPSY N/A 01/21/2016   Procedure: BIOPSY TRANSRECTAL ULTRASONIC PROSTATE (TUBP);  Surgeon: Heloise Purpura, MD;  Location: [facility];  Service: Urology;  Laterality: N/A;   PROSTATE BIOPSY N/A 07/15/2018   Procedure: NEEDLE BIOPSY TRANSRECTAL ULTRASONIC PROSTATE (TUBP);  Surgeon: Heloise Purpura, MD;  Location: [facility];  Service: Urology;  Laterality: N/A;   PROSTATE BIOPSY N/A 08/13/2020   Procedure: BIOPSY TRANSRECTAL ULTRASONIC PROSTATE (TUBP);  Surgeon: Heloise Purpura, MD;  Location: [facility];  Service: Urology;  Laterality: N/A;   RADIOACTIVE SEED IMPLANT N/A 04/11/2021   Procedure: RADIOACTIVE SEED IMPLANT/BRACHYTHERAPY IMPLANT WITH CYSTOSCOPY;  Surgeon: Heloise Purpura, MD;  Location: [facility];  Service: Urology;  Laterality: N/A;   SPACE OAR INSTILLATION N/A 04/11/2021   Procedure: SPACE OAR INSTILLATION;  Surgeon: Heloise Purpura, MD;  Location: [facility];  Service: Urology;  Laterality: N/A;    Family History  Problem Relation Age of Onset   Heart disease Mother    Hypertension Mother    Glaucoma Mother    Arthritis Father    Heart disease Father    Hypertension Father    Bladder Cancer Father 27   Colon polyps Father    Dementia Brother    Lymphoma Cousin    Prostate cancer Cousin        mother's first cousin   Colon cancer Neg Hx    Esophageal cancer Neg Hx    Rectal cancer Neg Hx    Stomach cancer Neg Hx    Breast cancer Neg Hx     Allergies  Allergen Reactions   Codeine Nausea And Vomiting    Current Outpatient Medications on File Prior to Visit  Medication Sig Dispense Refill   acetaminophen (TYLENOL) 500 MG tablet Take 1,000 mg by mouth every 6 (six) hours as needed for moderate pain (pain score  4-6).     diltiazem (CARDIZEM) 30 MG tablet Take 1 tablet (30 mg total) by mouth every 6 (six) hours as needed (breakthrough palpitations). 120 tablet 2   diltiazem (CARTIA XT) 120 MG 24 hr capsule Take 1 capsule (120 mg total) by mouth daily. Please keep scheduled appointment for future refills 90 capsule 3   isosorbide mononitrate (IMDUR) 30 MG 24 hr tablet Take 0.5 tablets (15 mg total) by mouth daily. 45 tablet 3   losartan (COZAAR) 50 MG tablet Take 1 tablet (50 mg total) by mouth daily. 90 tablet 3   metFORMIN (GLUCOPHAGE-XR) 500 MG 24 hr tablet Take 1 tablet (500 mg total) by mouth at bedtime. 90 tablet 3   Multiple Vitamin (MULTIVITAMIN) tablet Take 1 tablet by mouth daily.     pantoprazole (PROTONIX) 40 MG tablet Take 1 tablet (40 mg total) by mouth daily. 90 tablet 3   rivaroxaban (XARELTO) 20 MG TABS tablet Take 1 tablet (20 mg total) by mouth daily with supper. 90 tablet 1   rosuvastatin (CRESTOR) 20 MG tablet Take 1 tablet (20 mg total) by mouth daily. 90 tablet 3   Sodium Sulfate-Mag Sulfate-KCl (SUTAB) [phone] MG TABS Use as directed for colonoscopy. MANUFACTURER CODES!! BIN: [id number] PCN: CN GROUP: [id number] MEMBER ID: [id number];V[employer] AS SECONDARY INSURANCE ;NO PRIOR AUTHORIZATION 24 tablet 0   solifenacin (VESICARE) 5 MG tablet Take 5 mg by mouth daily.     tamsulosin (FLOMAX) 0.4 MG CAPS capsule Take 0.4 mg by mouth daily.     traZODone (DESYREL) 100 MG tablet Take 1 tablet (100 mg total) by mouth at bedtime as needed for sleep. 90 tablet 0   Turmeric 1053 MG TABS Take 3 tablets by mouth daily.     nitroGLYCERIN (NITROSTAT) 0.4 MG SL tablet Place 1 tablet (0.4 mg total) under the tongue every 5 (five) minutes as needed for chest pain. (Patient not taking: Reported on 03/23/2023) 30 tablet 3   No  current facility-administered medications on file prior to visit.    BP 120/80   Pulse 74   Temp 98 F (36.7 C) (Oral)   Ht 5' 8.5" (1.74 m)   Wt 199 lb (90.3 kg)   SpO2 98%    BMI 29.82 kg/m       Objective:   Physical Exam Vitals and nursing note reviewed.  Constitutional:      Appearance: Normal appearance.  Cardiovascular:     Rate and Rhythm: Normal rate and regular rhythm.     Pulses: Normal pulses.     Heart sounds: Normal heart sounds.  Pulmonary:     Effort: Pulmonary effort is normal.     Breath sounds: Normal breath sounds.  Skin:    General: Skin is warm and dry.  Neurological:     General: No focal deficit present.     Mental Status: He is alert and oriented to person, place, and time.  Psychiatric:        Mood and Affect: Mood normal.        Behavior: Behavior normal.        Thought Content: Thought content normal.        Judgment: Judgment normal.         Assessment & Plan:  1. Prediabetes (Primary)  - POC HgB A1c- 5.8 - improved. No change in medication  - Continue with Metformin 500 gm XR daily.  - Recheck at CPE  - CBC; Future - Hepatic function panel; Future  2. Primary hypertension - Well controlled. No change in medication.  - CBC; Future - Hepatic function panel; Future  3. Family history of hemochromatosis - his CBC's have been normal in the past.  - Can consider referral to hematology if needed - CBC; Future - IBC + Ferritin; Future - Hepatic function panel; Future  Shirline Frees, NP

## 2023-05-26 NOTE — Patient Instructions (Signed)
 Health Maintenance Due  Topic Date Due   COVID-19 Vaccine (5 - 2024-25 season) 10/19/2022       02/25/2023   10:24 AM 11/24/2022    9:00 AM 12/17/2021    9:52 AM  Depression screen PHQ 2/9  Decreased Interest 1 0 3  Down, Depressed, Hopeless 1 0 3  PHQ - 2 Score 2 0 6  Altered sleeping 1  0  Tired, decreased energy 1  2  Change in appetite 1  1  Feeling bad or failure about yourself  1  1  Trouble concentrating 1  1  Moving slowly or fidgety/restless 1  0  Suicidal thoughts 1  0  PHQ-9 Score 9  11  Difficult doing work/chores Not difficult at all  Very difficult

## 2023-06-18 ENCOUNTER — Telehealth: Payer: Self-pay | Admitting: Cardiology

## 2023-06-18 ENCOUNTER — Other Ambulatory Visit (HOSPITAL_COMMUNITY): Payer: Self-pay

## 2023-06-18 ENCOUNTER — Telehealth: Payer: Self-pay | Admitting: Pharmacy Technician

## 2023-06-18 ENCOUNTER — Other Ambulatory Visit: Payer: Self-pay | Admitting: *Deleted

## 2023-06-18 DIAGNOSIS — I4819 Other persistent atrial fibrillation: Secondary | ICD-10-CM

## 2023-06-18 MED ORDER — RIVAROXABAN 20 MG PO TABS: 20.0000 mg | ORAL_TABLET | Freq: Every day | ORAL | 1 refills | Status: DC

## 2023-06-18 NOTE — Telephone Encounter (Signed)
 Pt c/o medication issue:  1. Name of Medication: rivaroxaban  (XARELTO ) 20 MG TABS tablet   2. How are you currently taking this medication (dosage and times per day)? 20 mg, Daily with supper   3. Are you having a reaction (difficulty breathing--STAT)? No  4. What is your medication issue? Pt states they discontinued the program he was on for assistance and wants to know if any other options. Requesting cb

## 2023-06-18 NOTE — Telephone Encounter (Signed)
 I called [facility] and it is 396.86 for 30 days for xarelto . I called the patient and he said he would not qualify for johnson and [place] Renshaw -he had done the research and he makes too much. He said he would like to talk about changing to warfarin. I sent the staff a message to ask about warfarin.

## 2023-06-18 NOTE — Telephone Encounter (Signed)
 Message sent to Dr.Skains for coumadin order.

## 2023-06-18 NOTE — Telephone Encounter (Signed)
 Spoke with patient and he states they stopped the assistance program for xarelto . He would like to discuss other options . He is going to call his insurance to see if he can get on payment plan for the deductible.

## 2023-06-18 NOTE — Telephone Encounter (Signed)
 Prescription refill request for Xarelto  received.  Indication:afib  Last office visit: Skains, 01/20/2023 Weight: 90.3 kg  Age: 73 yo  Scr: 0.94, 02/25/2023 CrCl: 89 ml/min   Refill sent.

## 2023-06-19 NOTE — Telephone Encounter (Signed)
 Patient states he has only 1 pill left of his Xarelto  20 mg daily. He is not able to afford to continue on this medication and would like to switch to coumadin. Awaiting response from provider Dr. Renna Cary.  Patient requesting samples of Xarelto  20 mg to cover him until he can switch to coumadin.  Will send to Pharm D and Resource Nurse for samples for patient to pick-up today before end of day.

## 2023-06-19 NOTE — Telephone Encounter (Signed)
 Patient is calling back for update and to see if we have any samples. He only have one pill left. Please advise

## 2023-06-19 NOTE — Telephone Encounter (Signed)
 Patient identification verified by 2 forms. James Duck, RN     Called and spoke to patient. Informed him we currently are out of Xarelto  20 mg samples at [facility]. Patient willing to pick up from another location but [facility] does not have dose either.              Interventions/Plan: - encounter forwarded to primary cardiologist and nurse for review/follow-up   Patient agrees with plan, no questions at this time

## 2023-06-22 ENCOUNTER — Other Ambulatory Visit (HOSPITAL_COMMUNITY): Payer: Self-pay

## 2023-06-22 MED ORDER — WARFARIN SODIUM 5 MG PO TABS: 5.0000 mg | ORAL_TABLET | Freq: Every day | ORAL | 0 refills | Status: DC

## 2023-06-22 NOTE — Telephone Encounter (Signed)
 Pt calling for a update in regards to Warfarin. Please advise

## 2023-06-22 NOTE — Telephone Encounter (Signed)
 Called and spoke with pt. I educated pt on Warfarin and the need to check INR weekly until INR is therapeutic and then frequently depending on INR results. Instructed pt to start taking Warfain 5mg  daily, prescription sent to requested pharmacy. Pt is out of Xarelto . Scheduled New Coumadin clinic appt in 1 week, 06/29/23 at 10:30am. Pt verbalized understanding.

## 2023-06-22 NOTE — Addendum Note (Signed)
 Addended by: Natividad Balding B on: 06/22/2023 02:01 PM   Modules accepted: Orders

## 2023-06-22 NOTE — Telephone Encounter (Signed)
 Spoke with patient and he would like to start warfarin. He can not afford xarelto .

## 2023-06-29 ENCOUNTER — Other Ambulatory Visit: Payer: Self-pay

## 2023-06-29 ENCOUNTER — Ambulatory Visit: Attending: Cardiology

## 2023-06-29 DIAGNOSIS — I4819 Other persistent atrial fibrillation: Secondary | ICD-10-CM

## 2023-06-29 DIAGNOSIS — Z7901 Long term (current) use of anticoagulants: Secondary | ICD-10-CM

## 2023-06-29 DIAGNOSIS — D6869 Other thrombophilia: Secondary | ICD-10-CM | POA: Diagnosis not present

## 2023-06-29 LAB — PROTIME-INR
INR: >10 (ref 0.9–1.2)
Prothrombin Time: 98.3 s — ABNORMAL HIGH (ref 9.1–12.0)

## 2023-06-29 LAB — POCT INR: INR: 8 — AB (ref 2.0–3.0)

## 2023-06-29 NOTE — Patient Instructions (Addendum)
 Description   Pt sent to lab for STAT INR, pt has not taken today's dosage of Warfarin, will hold Warfarin until lab results received and we will call pt with further dosing recommendations.  Go to the ED with bleeding problems. Called spoke with pt, INR >10, advised to hold Warfarin until recheck in Clinic on 07/02/23.  Pt advised to significantly increase vit K intake today and tomorrow. Recheck INR on 07/02/23.  Coumadin  Clinic [phone] number [phone]     A full discussion of the nature of anticoagulants has been carried out.  A benefit risk analysis has been presented to the patient, so that they understand the justification for choosing anticoagulation at this time. The need for frequent and regular monitoring, precise dosage adjustment and compliance is stressed.  Side effects of potential bleeding are discussed.  The patient should avoid any OTC items containing aspirin  or ibuprofen, and should avoid great swings in general diet.  Avoid alcohol consumption.  Call if any signs of abnormal bleeding.

## 2023-07-02 ENCOUNTER — Ambulatory Visit: Attending: Cardiology

## 2023-07-02 DIAGNOSIS — I4819 Other persistent atrial fibrillation: Secondary | ICD-10-CM | POA: Insufficient documentation

## 2023-07-02 LAB — POCT INR: INR: 3.4 — AB (ref 2.0–3.0)

## 2023-07-02 NOTE — Patient Instructions (Signed)
 Description   Tomorrow, START taking 1/2 tablet daily  Stay consistent with greens each week (3-4 per week)  Recheck INR in 1 week  Coumadin  Clinic [phone] number [phone]

## 2023-07-10 ENCOUNTER — Ambulatory Visit: Attending: Cardiology | Admitting: *Deleted

## 2023-07-10 DIAGNOSIS — D6869 Other thrombophilia: Secondary | ICD-10-CM | POA: Diagnosis not present

## 2023-07-10 DIAGNOSIS — I4819 Other persistent atrial fibrillation: Secondary | ICD-10-CM | POA: Diagnosis not present

## 2023-07-10 DIAGNOSIS — Z7901 Long term (current) use of anticoagulants: Secondary | ICD-10-CM | POA: Insufficient documentation

## 2023-07-10 LAB — POCT INR: INR: 2.2 (ref 2.0–3.0)

## 2023-07-10 NOTE — Patient Instructions (Signed)
 Description   Continue taking warfarin 1/2 tablet daily  Stay consistent with greens each week (3-4 per week)  Recheck INR in 1 week  Coumadin  Clinic [phone] number [phone]

## 2023-07-17 ENCOUNTER — Ambulatory Visit: Attending: Cardiology

## 2023-07-17 DIAGNOSIS — I4819 Other persistent atrial fibrillation: Secondary | ICD-10-CM

## 2023-07-17 DIAGNOSIS — D6869 Other thrombophilia: Secondary | ICD-10-CM | POA: Diagnosis not present

## 2023-07-17 DIAGNOSIS — Z7901 Long term (current) use of anticoagulants: Secondary | ICD-10-CM | POA: Diagnosis not present

## 2023-07-17 LAB — POCT INR: INR: 2.6 (ref 2.0–3.0)

## 2023-07-17 NOTE — Patient Instructions (Signed)
 Continue taking warfarin 1/2 tablet daily  Stay consistent with greens each week (3-4 per week)  Recheck INR in 3 weeks  Coumadin  Clinic [phone] number [phone]

## 2023-08-07 ENCOUNTER — Other Ambulatory Visit: Payer: Self-pay | Admitting: Cardiology

## 2023-08-07 ENCOUNTER — Ambulatory Visit: Attending: Cardiology | Admitting: *Deleted

## 2023-08-07 DIAGNOSIS — D6869 Other thrombophilia: Secondary | ICD-10-CM | POA: Diagnosis not present

## 2023-08-07 DIAGNOSIS — Z7901 Long term (current) use of anticoagulants: Secondary | ICD-10-CM | POA: Diagnosis not present

## 2023-08-07 DIAGNOSIS — I4819 Other persistent atrial fibrillation: Secondary | ICD-10-CM | POA: Diagnosis not present

## 2023-08-07 LAB — POCT INR: INR: 4.1 — AB (ref 2.0–3.0)

## 2023-08-07 NOTE — Progress Notes (Signed)
Please see anticoagulation encounter.

## 2023-08-07 NOTE — Patient Instructions (Signed)
 Description   Do not take any warfarin tonight and resume your leafy veggies then continue taking warfarin 1/2 tablet daily.  Stay consistent with greens each week (3-4 per week)  Recheck INR in 2 weeks  Coumadin  Clinic [phone] number [phone]

## 2023-08-13 ENCOUNTER — Other Ambulatory Visit: Payer: Self-pay | Admitting: Adult Health

## 2023-08-13 DIAGNOSIS — F339 Major depressive disorder, recurrent, unspecified: Secondary | ICD-10-CM

## 2023-08-20 ENCOUNTER — Ambulatory Visit: Attending: Cardiology | Admitting: Pharmacist

## 2023-08-20 DIAGNOSIS — D6869 Other thrombophilia: Secondary | ICD-10-CM | POA: Diagnosis not present

## 2023-08-20 DIAGNOSIS — Z7901 Long term (current) use of anticoagulants: Secondary | ICD-10-CM | POA: Diagnosis not present

## 2023-08-20 DIAGNOSIS — I4819 Other persistent atrial fibrillation: Secondary | ICD-10-CM | POA: Insufficient documentation

## 2023-08-20 LAB — POCT INR: INR: 3.2 — AB (ref 2.0–3.0)

## 2023-08-20 NOTE — Progress Notes (Signed)
 Discussed decreasing dose since he has been supratherapeutic twice in a row. Already on 1/2 tablets so would need new Rx.  Patient will increase greens to 4x a week and recheck in 2 weeks  Please see anticoagulation encounter

## 2023-08-20 NOTE — Patient Instructions (Signed)
 Description   Do not take any warfarin tonight and resume your leafy veggies then continue taking warfarin 1/2 tablet daily.  Stay consistent with greens each week (3-4 per week)  Recheck INR in 2 weeks  Coumadin  Clinic [phone] number [phone]

## 2023-08-26 DIAGNOSIS — C61 Malignant neoplasm of prostate: Secondary | ICD-10-CM | POA: Diagnosis not present

## 2023-08-28 ENCOUNTER — Other Ambulatory Visit: Payer: Self-pay | Admitting: Physician Assistant

## 2023-08-28 DIAGNOSIS — I4819 Other persistent atrial fibrillation: Secondary | ICD-10-CM

## 2023-08-28 DIAGNOSIS — I10 Essential (primary) hypertension: Secondary | ICD-10-CM

## 2023-08-28 DIAGNOSIS — I2510 Atherosclerotic heart disease of native coronary artery without angina pectoris: Secondary | ICD-10-CM

## 2023-08-28 DIAGNOSIS — E782 Mixed hyperlipidemia: Secondary | ICD-10-CM

## 2023-09-02 ENCOUNTER — Ambulatory Visit: Attending: Cardiology

## 2023-09-02 DIAGNOSIS — R3915 Urgency of urination: Secondary | ICD-10-CM | POA: Diagnosis not present

## 2023-09-02 DIAGNOSIS — C61 Malignant neoplasm of prostate: Secondary | ICD-10-CM | POA: Diagnosis not present

## 2023-09-02 DIAGNOSIS — N401 Enlarged prostate with lower urinary tract symptoms: Secondary | ICD-10-CM | POA: Diagnosis not present

## 2023-09-02 DIAGNOSIS — I4819 Other persistent atrial fibrillation: Secondary | ICD-10-CM | POA: Insufficient documentation

## 2023-09-02 LAB — POCT INR: INR: 1.8 — AB (ref 2.0–3.0)

## 2023-09-02 NOTE — Patient Instructions (Signed)
 Description   Take 1 tablet today and then Continue warfarin 1/2 tablet daily.  Stay consistent with greens each week (3-4 per week)  Recheck INR in 3 weeks  Coumadin  Clinic [phone] number [phone]

## 2023-09-02 NOTE — Progress Notes (Signed)
Please see anticoagulation encounter.

## 2023-09-05 ENCOUNTER — Other Ambulatory Visit: Payer: Self-pay | Admitting: Cardiology

## 2023-09-05 DIAGNOSIS — I4819 Other persistent atrial fibrillation: Secondary | ICD-10-CM

## 2023-09-07 ENCOUNTER — Other Ambulatory Visit: Payer: Self-pay

## 2023-09-07 MED ORDER — DILTIAZEM HCL 30 MG PO TABS: 30.0000 mg | ORAL_TABLET | Freq: Four times a day (QID) | ORAL | 1 refills | Status: AC | PRN

## 2023-09-23 ENCOUNTER — Ambulatory Visit: Attending: Cardiology | Admitting: *Deleted

## 2023-09-23 DIAGNOSIS — D6869 Other thrombophilia: Secondary | ICD-10-CM | POA: Diagnosis not present

## 2023-09-23 DIAGNOSIS — Z7901 Long term (current) use of anticoagulants: Secondary | ICD-10-CM | POA: Diagnosis not present

## 2023-09-23 DIAGNOSIS — I4819 Other persistent atrial fibrillation: Secondary | ICD-10-CM | POA: Insufficient documentation

## 2023-09-23 LAB — POCT INR: INR: 3.2 — AB (ref 2.0–3.0)

## 2023-09-23 NOTE — Progress Notes (Signed)
 INR 3.2. Please see anticoagulation encounter

## 2023-09-23 NOTE — Patient Instructions (Signed)
 Description   Do not take any warfarin today then continue warfarin 1/2 tablet daily.  Stay consistent with greens each week (3-4 per week)  Recheck INR in 3 weeks  Coumadin  Clinic [phone] number [phone]

## 2023-10-02 ENCOUNTER — Other Ambulatory Visit: Payer: Self-pay | Admitting: Cardiology

## 2023-10-02 DIAGNOSIS — I4819 Other persistent atrial fibrillation: Secondary | ICD-10-CM

## 2023-10-14 ENCOUNTER — Ambulatory Visit: Attending: Cardiology

## 2023-10-14 DIAGNOSIS — D6869 Other thrombophilia: Secondary | ICD-10-CM | POA: Diagnosis not present

## 2023-10-14 DIAGNOSIS — I4819 Other persistent atrial fibrillation: Secondary | ICD-10-CM | POA: Insufficient documentation

## 2023-10-14 DIAGNOSIS — Z7901 Long term (current) use of anticoagulants: Secondary | ICD-10-CM | POA: Diagnosis not present

## 2023-10-14 LAB — POCT INR: INR: 1.9 — AB (ref 2.0–3.0)

## 2023-10-14 NOTE — Progress Notes (Signed)
 Description   INR 1.9 Take 1 tablet today, then continue warfarin 1/2 tablet daily.  Stay consistent with greens each week (3-4 per week)  Recheck INR in 4 weeks  Coumadin  Clinic [phone] number [phone]

## 2023-10-14 NOTE — Patient Instructions (Signed)
 Description   INR 1.9 Take 1 tablet today, then continue warfarin 1/2 tablet daily.  Stay consistent with greens each week (3-4 per week)  Recheck INR in 4 weeks  Coumadin  Clinic [phone] number [phone]

## 2023-10-27 ENCOUNTER — Other Ambulatory Visit: Payer: Self-pay | Admitting: Adult Health

## 2023-10-27 DIAGNOSIS — F339 Major depressive disorder, recurrent, unspecified: Secondary | ICD-10-CM

## 2023-11-11 ENCOUNTER — Ambulatory Visit: Attending: Cardiology | Admitting: *Deleted

## 2023-11-11 DIAGNOSIS — I4819 Other persistent atrial fibrillation: Secondary | ICD-10-CM | POA: Insufficient documentation

## 2023-11-11 DIAGNOSIS — Z7901 Long term (current) use of anticoagulants: Secondary | ICD-10-CM | POA: Insufficient documentation

## 2023-11-11 DIAGNOSIS — D6869 Other thrombophilia: Secondary | ICD-10-CM | POA: Insufficient documentation

## 2023-11-11 LAB — POCT INR: INR: 2.6 (ref 2.0–3.0)

## 2023-11-11 NOTE — Patient Instructions (Signed)
 Description   INR 2.6; Ccontinue warfarin 1/2 tablet daily.  Stay consistent with greens each week (3-4 per week)  Recheck INR in 4 weeks  Coumadin  Clinic [phone] number [phone]

## 2023-11-11 NOTE — Progress Notes (Signed)
 Description   INR 2.6; Ccontinue warfarin 1/2 tablet daily.  Stay consistent with greens each week (3-4 per week)  Recheck INR in 4 weeks  Coumadin  Clinic [phone] number [phone]

## 2023-11-27 ENCOUNTER — Ambulatory Visit: Payer: Medicare Other

## 2023-11-27 VITALS — BP 120/60 | HR 73 | Temp 98.3°F | Ht 68.5 in | Wt 194.6 lb

## 2023-11-27 DIAGNOSIS — Z0000 Encounter for general adult medical examination without abnormal findings: Secondary | ICD-10-CM | POA: Diagnosis not present

## 2023-11-27 NOTE — Patient Instructions (Addendum)
 James Edwards,  Thank you for taking the time for your Medicare Wellness Visit. I appreciate your continued commitment to your health goals. Please review the care plan we discussed, and feel free to reach out if I can assist you further.  Medicare recommends these wellness visits once per year to help you and your care team stay ahead of potential health issues. These visits are designed to focus on prevention, allowing your provider to concentrate on managing your acute and chronic conditions during your regular appointments.  Please note that Annual Wellness Visits do not include a physical exam. Some assessments may be limited, especially if the visit was conducted virtually. If needed, we may recommend a separate in-person follow-up with your provider.  Ongoing Care Seeing your primary care provider every 3 to 6 months helps us  monitor your health and provide consistent, personalized care.   Referrals If a referral was made during today's visit and you haven't received any updates within two weeks, please contact the referred provider directly to check on the status.  Recommended Screenings:  Health Maintenance  Topic Date Due   Zoster (Shingles) Vaccine (2 of 2) 02/18/2023   Flu Shot  09/18/2023   COVID-19 Vaccine (5 - 2025-26 season) 10/19/2023   Hemoglobin A1C  11/25/2023   Yearly kidney health urinalysis for diabetes  02/25/2028*   Yearly kidney function blood test for diabetes  02/25/2024   Medicare Annual Wellness Visit  11/26/2024   Colon Cancer Screening  04/29/2028   DTaP/Tdap/Td vaccine (2 - Td or Tdap) 01/13/2033   Pneumococcal Vaccine for age over 60  Completed   Hepatitis C Screening  Completed   Meningitis B Vaccine  Aged Out   Screening for Lung Cancer  Discontinued   Complete foot exam   Discontinued   Eye exam for diabetics  Discontinued  *Topic was postponed. The date shown is not the original due date.       11/27/2023   10:16 AM  Advanced Directives   Does Patient Have a Medical Advance Directive? No  Would patient like information on creating a medical advance directive? No - Patient declined   Advance Care Planning is important because it: Ensures you receive medical care that aligns with your values, goals, and preferences. Provides guidance to your family and loved ones, reducing the emotional burden of decision-making during critical moments.  Vision: Annual vision screenings are recommended for early detection of glaucoma, cataracts, and diabetic retinopathy. These exams can also reveal signs of chronic conditions such as diabetes and high blood pressure.  Dental: Annual dental screenings help detect early signs of oral cancer, gum disease, and other conditions linked to overall health, including heart disease and diabetes.  Please see the attached documents for additional preventive care recommendations.

## 2023-11-27 NOTE — Progress Notes (Signed)
 Subjective:   James Edwards is a 73 y.o. who presents for a Medicare Wellness preventive visit.  As a reminder, Annual Wellness Visits don't include a physical exam, and some assessments may be limited, especially if this visit is performed virtually. We may recommend an in-person follow-up visit with your provider if needed.  Visit Complete: In person    Persons Participating in Visit: Patient.  AWV Questionnaire: No: Patient Medicare AWV questionnaire was not completed prior to this visit.  Cardiac Risk Factors include: advanced age (>58men, >42 women);male gender;hypertension     Objective:    Today's Vitals   11/27/23 1000  BP: 120/60  Pulse: 73  Temp: 98.3 F (36.8 C)  TempSrc: Oral  SpO2: 97%  Weight: 194 lb 9.6 oz (88.3 kg)  Height: 5' 8.5 (1.74 m)   Body mass index is 29.16 kg/m.     11/27/2023   10:16 AM 01/14/2023    2:31 PM 01/14/2023   12:57 PM 11/24/2022    9:04 AM 11/18/2021    8:48 AM 05/02/2021   10:04 AM 04/11/2021   10:44 AM  Advanced Directives  Does Patient Have a Medical Advance Directive? No No No No Yes No No  Type of Agricultural consultant;Living will    Copy of Healthcare Power of Attorney in Chart?     No - copy requested    Would patient like information on creating a medical advance directive? No - Patient declined No - Patient declined No - Patient declined No - Patient declined  Yes (ED - Information included in AVS) No - Patient declined    Current Medications (verified) Outpatient Encounter Medications as of 11/27/2023  Medication Sig   acetaminophen  (TYLENOL ) 500 MG tablet Take 1,000 mg by mouth every 6 (six) hours as needed for moderate pain (pain score 4-6).   diltiazem  (CARDIZEM ) 30 MG tablet Take 1 tablet (30 mg total) by mouth every 6 (six) hours as needed (breakthrough palpitations).   diltiazem  (CARTIA  XT) 120 MG 24 hr capsule Take 1 capsule (120 mg total) by mouth daily. Please keep  scheduled appointment for future refills   isosorbide  mononitrate (IMDUR ) 30 MG 24 hr tablet TAKE ONE-HALF TABLET BY MOUTH DAILY   losartan  (COZAAR ) 50 MG tablet Take 1 tablet (50 mg total) by mouth daily.   metFORMIN  (GLUCOPHAGE -XR) 500 MG 24 hr tablet Take 1 tablet (500 mg total) by mouth at bedtime.   Multiple Vitamin (MULTIVITAMIN) tablet Take 1 tablet by mouth daily.   nitroGLYCERIN  (NITROSTAT ) 0.4 MG SL tablet Place 1 tablet (0.4 mg total) under the tongue every 5 (five) minutes as needed for chest pain. (Patient not taking: Reported on 03/23/2023)   pantoprazole  (PROTONIX ) 40 MG tablet Take 1 tablet (40 mg total) by mouth daily.   rosuvastatin  (CRESTOR ) 20 MG tablet Take 1 tablet (20 mg total) by mouth daily.   Sodium Sulfate-Mag Sulfate-KCl (SUTAB ) 1479-225-188 MG TABS Use as directed for colonoscopy. MANUFACTURER CODES!! BIN: [id number] PCN: CN GROUP: [id number] MEMBER ID: [id number];MLW AS SECONDARY INSURANCE ;NO PRIOR AUTHORIZATION   solifenacin (VESICARE) 5 MG tablet Take 5 mg by mouth daily.   tamsulosin  (FLOMAX ) 0.4 MG CAPS capsule Take 0.4 mg by mouth daily.   traZODone  (DESYREL ) 100 MG tablet Take 1 tablet (100 mg total) by mouth at bedtime as needed for sleep.   Turmeric 1053 MG TABS Take 3 tablets by mouth daily.   warfarin (COUMADIN ) 5 MG tablet take half  to one tablet by mouth daily or as directed by coumadin  clinic   No facility-administered encounter medications on file as of 11/27/2023.    Allergies (verified) Codeine   History: Past Medical History:  Diagnosis Date   BPH without obstruction/lower urinary tract symptoms    Coronary artery disease    cardiologist--- dr hobart;  cath 05-18-2020  mininmal nonobstructive disease   ED (erectile dysfunction)    GERD (gastroesophageal reflux disease)    no meds   History of 2019 novel coronavirus disease (COVID-19) 2020   per pt asymptomatic   History of melanoma excision 06/2020   per pt localized area mid back    Hyperlipidemia, mixed    Hypertension    followed by pcp   Mild obstructive sleep apnea    followed by dr young;   study in epic 09-25-2020,  AHI 8.8/hr,  no cpap   OA (osteoarthritis)    Persistent atrial fibrillation (HCC) 03/2020   cardiologist-- dr h. hobart---  on xarelto  and bb;  work-up includes TTE, CTA, event monitor, and cardiac cath (results in epic),  pt ef 55-656 per cath and echo 55%   Pre-diabetes    Prostate cancer (HCC) 08/2009   urologist--- dr renda--- first dx 07/ 2011,  Gleason 3+3,  active surveillance since;  last bx 06/ 2022  Gleason 7 scheduled for brachtherapy   Wears glasses    Past Surgical History:  Procedure Laterality Date   COLONOSCOPY     last one 2019 approx   ELBOW SURGERY Right 2007   FINGER SURGERY     LEFT HEART CATH AND CORONARY ANGIOGRAPHY N/A 05/18/2020   Procedure: LEFT HEART CATH AND CORONARY ANGIOGRAPHY;  Surgeon: Swaziland, Peter M, MD;  Location: [facility] INVASIVE CV LAB;  Service: Cardiovascular;  Laterality: N/A;   OPEN REDUCTION INTERNAL FIXATION (ORIF) DISTAL PHALANX Left 01/14/2023   Procedure: REPAIR OF FRACTURE LEFT MIDDLE FINGER AND LEFT RING FINGER. AMPUTATE TIP OF MIDDLE FINGER;  Surgeon: Lorretta Dess, MD;  Location: [facility];  Service: Plastics;  Laterality: Left;   PROSTATE BIOPSY N/A 01/21/2016   Procedure: BIOPSY TRANSRECTAL ULTRASONIC PROSTATE (TUBP);  Surgeon: Gretel renda, MD;  Location: [facility];  Service: Urology;  Laterality: N/A;   PROSTATE BIOPSY N/A 07/15/2018   Procedure: NEEDLE BIOPSY TRANSRECTAL ULTRASONIC PROSTATE (TUBP);  Surgeon: renda Gretel, MD;  Location: [facility];  Service: Urology;  Laterality: N/A;   PROSTATE BIOPSY N/A 08/13/2020   Procedure: BIOPSY TRANSRECTAL ULTRASONIC PROSTATE (TUBP);  Surgeon: renda Gretel, MD;  Location: [facility];  Service: Urology;  Laterality: N/A;   RADIOACTIVE SEED IMPLANT N/A 04/11/2021   Procedure: RADIOACTIVE SEED IMPLANT/BRACHYTHERAPY IMPLANT WITH CYSTOSCOPY;   Surgeon: renda Gretel, MD;  Location: [facility];  Service: Urology;  Laterality: N/A;   SPACE OAR INSTILLATION N/A 04/11/2021   Procedure: SPACE OAR INSTILLATION;  Surgeon: renda Gretel, MD;  Location: [facility];  Service: Urology;  Laterality: N/A;   Family History  Problem Relation Age of Onset   Heart disease Mother    Hypertension Mother    Glaucoma Mother    Arthritis Father    Heart disease Father    Hypertension Father    Bladder Cancer Father 16   Colon polyps Father    Dementia Brother    Lymphoma Cousin    Prostate cancer Cousin        mother's first cousin   Colon cancer Neg Hx    Esophageal cancer Neg Hx  Rectal cancer Neg Hx    Stomach cancer Neg Hx    Breast cancer Neg Hx    Social History   Socioeconomic History   Marital status: Married    Spouse name: Not on file   Number of children: 1   Years of education: Not on file   Highest education level: Bachelor's degree (e.g., BA, AB, BS)  Occupational History   Not on file  Tobacco Use   Smoking status: Former    Current packs/day: 0.00    Average packs/day: 2.0 packs/day for 35.0 years (70.0 ttl pk-yrs)    Types: Cigarettes    Start date: 23    Quit date: 2009    Years since quitting: 16.7   Smokeless tobacco: Never  Vaping Use   Vaping status: Never Used  Substance and Sexual Activity   Alcohol use: Yes    Alcohol/week: 42.0 standard drinks of alcohol    Types: 42 Cans of beer per week    Comment: 6pk beer per day  (12 oz each)   Drug use: Never   Sexual activity: Not on file  Other Topics Concern   Not on file  Social History Narrative   Lives in [place] with wife.   Retired.  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    Two Children who both live locally.       He likes to sleep.    Social Drivers of Corporate investment banker Strain: Low Risk  (11/27/2023)   Overall Financial Resource Strain  (CARDIA)    Difficulty of Paying Living Expenses: Not hard at all  Food Insecurity: No Food Insecurity (11/27/2023)   Hunger Vital Sign    Worried About Running Out of Food in the Last Year: Never true    Ran Out of Food in the Last Year: Never true  Transportation Needs: No Transportation Needs (11/27/2023)   PRAPARE - Administrator, Civil Service (Medical): No    Lack of Transportation (Non-Medical): No  Physical Activity: Inactive (11/27/2023)   Exercise Vital Sign    Days of Exercise per Week: 0 days    Minutes of Exercise per Session: 0 min  Stress: No Stress Concern Present (11/27/2023)   [employer] of Occupational Health - Occupational Stress Questionnaire    Feeling of Stress: Not at all  Social Connections: Moderately Isolated (11/27/2023)   Social Connection and Isolation Panel    Frequency of Communication with Friends and Family: More than three times a week    Frequency of Social Gatherings with Friends and Family: More than three times a week    Attends Religious Services: Never    Database administrator or Organizations: No    Attends Engineer, structural: Never    Marital Status: Married    Tobacco Counseling Counseling given: Not Answered    Clinical Intake:  Pre-visit preparation completed: Yes  Pain : No/denies pain     BMI - recorded: 29.16 Nutritional Status: BMI 25 -29 Overweight Nutritional Risks: None Diabetes: No  Lab Results  Component Value Date   HGBA1C 5.8 (A) 05/26/2023   HGBA1C 6.4 02/25/2023   HGBA1C 6.1 (H) 01/24/2022     How often do you need to have someone help you when you read instructions, pamphlets, or other written materials from your doctor or pharmacy?: 1 - Never  Interpreter Needed?: No  Information entered by :: Rojelio Blush LPN  Activities of Daily Living     11/27/2023   10:15 AM 02/25/2023    9:45 AM  In your present state of health, do you have any difficulty performing the  following activities:  Hearing? 0 1  Vision? 0 0  Difficulty concentrating or making decisions? 0 0  Walking or climbing stairs? 0 0  Dressing or bathing? 0 0  Doing errands, shopping? 0 0  Preparing Food and eating ? N   Using the Toilet? N   In the past six months, have you accidently leaked urine? Y   Comment Followed by Urologist   Do you have problems with loss of bowel control? N   Managing your Medications? N   Managing your Finances? N   Housekeeping or managing your Housekeeping? N     Patient Care Team: Merna Huxley, NP as PCP - General (Family Medicine) Kelsie Agent, MD (Inactive) as PCP - Electrophysiology (Clinical Cardiac Electrophysiology) Jeffrie Oneil BROCKS, MD as PCP - Cardiology (Cardiology) Pa, [facility] [place], Jon DEL, [facility] (Pharmacist)  I have updated your Care Teams any recent Medical Services you may have received from other providers in the past year.     Assessment:   This is a routine wellness examination for Ori.  Hearing/Vision screen Hearing Screening - Comments:: Denies hearing difficulties   Vision Screening - Comments:: Wears rx glasses - up to date with routine eye exams with  [facility]   Goals Addressed               This Visit's Progress     Increase physical activity (pt-stated)        Remain active.       Depression Screen     11/27/2023   10:03 AM 05/26/2023    9:49 AM 02/25/2023   10:24 AM 11/24/2022    9:00 AM 12/17/2021    9:52 AM 11/18/2021    8:49 AM 11/15/2020   10:23 AM  PHQ 2/9 Scores  PHQ - 2 Score 0 2 2 0 6 0 0  PHQ- 9 Score  7 9  11       Fall Risk     11/27/2023   10:15 AM 02/25/2023    9:45 AM 12/15/2022    3:02 PM 11/24/2022    9:04 AM 12/17/2021    9:53 AM  Fall Risk   Falls in the past year? 0 0 0 0 1  Number falls in past yr: 0 1  0 0  Injury with Fall? 0 0  0 0  Risk for fall due to : No Fall Risks No Fall Risks  No Fall Risks No Fall Risks  Follow up Falls evaluation  completed Falls evaluation completed  Falls prevention discussed Falls evaluation completed      Data saved with a previous flowsheet row definition    MEDICARE RISK AT HOME:  Medicare Risk at Home Any stairs in or around the home?: Yes If so, are there any without handrails?: No Home free of loose throw rugs in walkways, pet beds, electrical cords, etc?: Yes Adequate lighting in your home to reduce risk of falls?: Yes Life alert?: No Use of a cane, walker or w/c?: No Grab bars in the bathroom?: Yes Shower chair or bench in shower?: No Elevated toilet seat or a handicapped toilet?: No  TIMED UP AND GO:  Was the test performed?  Yes  Length of time to ambulate 10 feet: 10 sec Gait steady and fast without use  of assistive device  Cognitive Function: 6CIT completed        11/27/2023   10:16 AM 11/24/2022    9:05 AM 11/18/2021    8:51 AM 10/26/2019    9:11 AM  6CIT Screen  What Year? 0 points 0 points 0 points 0 points  What month? 0 points 0 points 0 points 0 points  What time? 0 points 0 points 3 points 0 points  Count back from 20 0 points 0 points 0 points 0 points  Months in reverse 0 points 0 points 4 points 2 points  Repeat phrase 0 points 0 points 0 points 0 points  Total Score 0 points 0 points 7 points 2 points    Immunizations Immunization History  Administered Date(s) Administered   Fluad Quad(high Dose 65+) 01/01/2022   Fluad Trivalent(High Dose 65+) 12/16/2022   INFLUENZA, HIGH DOSE SEASONAL PF 10/18/2014, 10/24/2015, 12/05/2017, 12/07/2019, 12/17/2020   Moderna Covid-19 Vaccine Bivalent Booster 56yrs & up 12/17/2020   Moderna SARS-COV2 Booster Vaccination 12/13/2019   Moderna Sars-Covid-2 Vaccination 04/02/2019, 04/30/2019   Pfizer(Comirnaty)Fall Seasonal Vaccine 12 years and older 01/01/2022   Pneumococcal Conjugate-13 10/18/2014   Pneumococcal Polysaccharide-23 10/24/2015   RSV,unspecified 12/24/2022   Tdap 01/14/2023   Zoster Recombinant(Shingrix)  12/24/2022    Screening Tests Health Maintenance  Topic Date Due   Zoster Vaccines- Shingrix (2 of 2) 02/18/2023   Influenza Vaccine  09/18/2023   COVID-19 Vaccine (5 - 2025-26 season) 10/19/2023   HEMOGLOBIN A1C  11/25/2023   Diabetic kidney evaluation - Urine ACR  02/25/2028 (Originally 09/20/1968)   Diabetic kidney evaluation - eGFR measurement  02/25/2024   Medicare Annual Wellness (AWV)  11/26/2024   Colonoscopy  04/29/2028   DTaP/Tdap/Td (2 - Td or Tdap) 01/13/2033   Pneumococcal Vaccine: 50+ Years  Completed   Hepatitis C Screening  Completed   Meningococcal B Vaccine  Aged Out   Lung Cancer Screening  Discontinued   FOOT EXAM  Discontinued   OPHTHALMOLOGY EXAM  Discontinued    Health Maintenance Items Addressed:   Additional Screening:  Vision Screening: Recommended annual ophthalmology exams for early detection of glaucoma and other disorders of the eye. Is the patient up to date with their annual eye exam?  Yes  Who is the provider or what is the name of the office in which the patient attends annual eye exams? [facility]  Dental Screening: Recommended annual dental exams for proper oral hygiene  Community Resource Referral / Chronic Care Management: CRR required this visit?  No   CCM required this visit?  No   Plan:    I have personally reviewed and noted the following in the patient's chart:   Medical and social history Use of alcohol, tobacco or illicit drugs  Current medications and supplements including opioid prescriptions. Patient is not currently taking opioid prescriptions. Functional ability and status Nutritional status Physical activity Advanced directives List of other physicians Hospitalizations, surgeries, and ER visits in previous 12 months Vitals Screenings to include cognitive, depression, and falls Referrals and appointments  In addition, I have reviewed and discussed with patient certain preventive protocols, quality metrics, and  best practice recommendations. A written personalized care plan for preventive services as well as general preventive health recommendations were provided to patient.   Rojelio LELON Blush, LPN   [id number]/89/[id number]   After Visit Summary: (In Person-Printed) AVS printed and given to the patient  Notes: Nothing significant to report at this time.

## 2023-12-07 ENCOUNTER — Ambulatory Visit: Attending: Cardiology

## 2023-12-07 DIAGNOSIS — I4819 Other persistent atrial fibrillation: Secondary | ICD-10-CM | POA: Insufficient documentation

## 2023-12-07 DIAGNOSIS — D6869 Other thrombophilia: Secondary | ICD-10-CM | POA: Insufficient documentation

## 2023-12-07 DIAGNOSIS — Z7901 Long term (current) use of anticoagulants: Secondary | ICD-10-CM | POA: Diagnosis not present

## 2023-12-07 LAB — POCT INR: INR: 2.1 (ref 2.0–3.0)

## 2023-12-07 NOTE — Progress Notes (Signed)
 INR 2.1 Please see anticoagulation encounter continue warfarin 1/2 tablet daily.  Stay consistent with greens each week (3-4 per week)  Recheck INR in 6 weeks  Coumadin  Clinic [id number] number [phone]

## 2023-12-07 NOTE — Patient Instructions (Signed)
 continue warfarin 1/2 tablet daily.  Stay consistent with greens each week (3-4 per week)  Recheck INR in 6 weeks  Coumadin  Clinic [id number] number [phone]

## 2023-12-09 ENCOUNTER — Encounter

## 2023-12-15 ENCOUNTER — Encounter: Payer: Self-pay | Admitting: Adult Health

## 2023-12-15 ENCOUNTER — Ambulatory Visit (INDEPENDENT_AMBULATORY_CARE_PROVIDER_SITE_OTHER): Admitting: Adult Health

## 2023-12-15 VITALS — BP 138/80 | HR 103 | Temp 97.9°F | Ht 68.5 in | Wt 197.0 lb

## 2023-12-15 DIAGNOSIS — R0789 Other chest pain: Secondary | ICD-10-CM

## 2023-12-15 DIAGNOSIS — Z23 Encounter for immunization: Secondary | ICD-10-CM

## 2023-12-15 DIAGNOSIS — H9192 Unspecified hearing loss, left ear: Secondary | ICD-10-CM

## 2023-12-15 DIAGNOSIS — M25552 Pain in left hip: Secondary | ICD-10-CM

## 2023-12-15 NOTE — Progress Notes (Signed)
 Subjective:    Patient ID: James Edwards, male    DOB: 12/10/1950, 73 y.o.   MRN: [id number]  HPI Discussed the use of AI scribe software for clinical note transcription with the patient, who gave verbal consent to proceed.  History of Present Illness   James Edwards is a 73 year old male who presents with left hip pain and recent fall injuries.  He has experienced left hip pain for three months, with recent improvement. Initially able to walk two miles, he now manages only around the block. The pain is sharp and occurs after walking one block. Tylenol  provides some relief.  Five days ago, he fell while changing a light bulb, resulting in a bruised sternum and minor head injury. He lost consciousness for five seconds with minor bleeding from the crown of his head. No dizziness, lightheadedness, or blurred vision followed. He is on Coumadin .  He has issues with his left ear, affecting hearing in group settings, but no significant loss in the right ear. Reports that he was seen at [facility] hearing center and was advised that  they could not fit me there and that I needed to follow up with ENT        Review of Systems  See HPI    Past Medical History:  Diagnosis Date   BPH without obstruction/lower urinary tract symptoms    Coronary artery disease    cardiologist--- dr hobart;  cath 05-18-2020  mininmal nonobstructive disease   ED (erectile dysfunction)    GERD (gastroesophageal reflux disease)    no meds   History of 2019 novel coronavirus disease (COVID-19) 2020   per pt asymptomatic   History of melanoma excision 06/2020   per pt localized area mid back   Hyperlipidemia, mixed    Hypertension    followed by pcp   Mild obstructive sleep apnea    followed by dr young;   study in epic 09-25-2020,  AHI 8.8/hr,  no cpap   OA (osteoarthritis)    Persistent atrial fibrillation (HCC) 03/2020   cardiologist-- dr h. hobart---  on xarelto  and bb;  work-up includes  TTE, CTA, event monitor, and cardiac cath (results in epic),  pt ef 55-656 per cath and echo 55%   Pre-diabetes    Prostate cancer (HCC) 08/2009   urologist--- dr renda--- first dx 07/ 2011,  Gleason 3+3,  active surveillance since;  last bx 06/ 2022  Gleason 7 scheduled for brachtherapy   Wears glasses     Social History   Socioeconomic History   Marital status: Married    Spouse name: Not on file   Number of children: 1   Years of education: Not on file   Highest education level: Bachelor's degree (e.g., BA, AB, BS)  Occupational History   Not on file  Tobacco Use   Smoking status: Former    Current packs/day: 0.00    Average packs/day: 2.0 packs/day for 35.0 years (70.0 ttl pk-yrs)    Types: Cigarettes    Start date: 22    Quit date: 2009    Years since quitting: 16.8   Smokeless tobacco: Never  Vaping Use   Vaping status: Never Used  Substance and Sexual Activity   Alcohol use: Yes    Alcohol/week: 42.0 standard drinks of alcohol    Types: 42 Cans of beer per week    Comment: 6pk beer per day  (12 oz each)   Drug use: Never  Sexual activity: Not on file  Other Topics Concern   Not on file  Social History Narrative   Lives in [place] with wife.   Retired.  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    Two Children who both live locally.       He likes to sleep.    Social Drivers of Corporate Investment Banker Strain: Low Risk  (11/27/2023)   Overall Financial Resource Strain (CARDIA)    Difficulty of Paying Living Expenses: Not hard at all  Food Insecurity: No Food Insecurity (11/27/2023)   Hunger Vital Sign    Worried About Running Out of Food in the Last Year: Never true    Ran Out of Food in the Last Year: Never true  Transportation Needs: No Transportation Needs (11/27/2023)   PRAPARE - Administrator, Civil Service (Medical): No    Lack of Transportation (Non-Medical): No  Physical Activity:  Inactive (11/27/2023)   Exercise Vital Sign    Days of Exercise per Week: 0 days    Minutes of Exercise per Session: 0 min  Stress: No Stress Concern Present (11/27/2023)   [employer] of Occupational Health - Occupational Stress Questionnaire    Feeling of Stress: Not at all  Social Connections: Moderately Isolated (11/27/2023)   Social Connection and Isolation Panel    Frequency of Communication with Friends and Family: More than three times a week    Frequency of Social Gatherings with Friends and Family: More than three times a week    Attends Religious Services: Never    Database Administrator or Organizations: No    Attends Banker Meetings: Never    Marital Status: Married  Catering Manager Violence: Not At Risk (11/27/2023)   Humiliation, Afraid, Rape, and Kick questionnaire    Fear of Current or Ex-Partner: No    Emotionally Abused: No    Physically Abused: No    Sexually Abused: No    Past Surgical History:  Procedure Laterality Date   COLONOSCOPY     last one 2019 approx   ELBOW SURGERY Right 2007   FINGER SURGERY     LEFT HEART CATH AND CORONARY ANGIOGRAPHY N/A 05/18/2020   Procedure: LEFT HEART CATH AND CORONARY ANGIOGRAPHY;  Surgeon: Jordan, Peter M, MD;  Location: [facility] IN[facility]ASIVE [facility];  Service: Cardiovascular;  Laterality: N/A;   OPEN REDUCTION INTERNAL FIXATION (ORIF) DISTAL PHALANX Left 01/14/2023   Procedure: REPAIR OF FRACTURE LEFT MIDDLE FINGER AND LEFT RING FINGER. AMPUTATE TIP OF MIDDLE FINGER;  Surgeon: Lorretta Dess, MD;  Location: [facility];  Service: Plastics;  Laterality: Left;   PROSTATE BIOPSY N/A 01/21/2016   Procedure: BIOPSY TRANSRECTAL ULTRASONIC PROSTATE (TUBP);  Surgeon: Gretel Ferrara, MD;  Location: [facility];  Service: Urology;  Laterality: N/A;   PROSTATE BIOPSY N/A 07/15/2018   Procedure: NEEDLE BIOPSY TRANSRECTAL ULTRASONIC PROSTATE (TUBP);  Surgeon: Ferrara Gretel, MD;  Location: [facility];  Service: Urology;  Laterality: N/A;    PROSTATE BIOPSY N/A 08/13/2020   Procedure: BIOPSY TRANSRECTAL ULTRASONIC PROSTATE (TUBP);  Surgeon: Ferrara Gretel, MD;  Location: [facility];  Service: Urology;  Laterality: N/A;   RADIOACTIVE SEED IMPLANT N/A 04/11/2021   Procedure: RADIOACTIVE SEED IMPLANT/BRACHYTHERAPY IMPLANT WITH CYSTOSCOPY;  Surgeon: Ferrara Gretel, MD;  Location: [facility];  Service: Urology;  Laterality: N/A;   SPACE OAR INSTILLATION N/A 04/11/2021   Procedure: SPACE OAR INSTILLATION;  Surgeon:  Renda Glance, MD;  Location: [facility];  Service: Urology;  Laterality: N/A;    Family History  Problem Relation Age of Onset   Heart disease Mother    Hypertension Mother    Glaucoma Mother    Arthritis Father    Heart disease Father    Hypertension Father    Bladder Cancer Father 7   Colon polyps Father    Dementia Brother    Lymphoma Cousin    Prostate cancer Cousin        mother's first cousin   Colon cancer Neg Hx    Esophageal cancer Neg Hx    Rectal cancer Neg Hx    Stomach cancer Neg Hx    Breast cancer Neg Hx     Allergies  Allergen Reactions   Codeine Nausea And Vomiting    Current Outpatient Medications on File Prior to Visit  Medication Sig Dispense Refill   acetaminophen  (TYLENOL ) 500 MG tablet Take 1,000 mg by mouth every 6 (six) hours as needed for moderate pain (pain score 4-6).     diltiazem  (CARDIZEM ) 30 MG tablet Take 1 tablet (30 mg total) by mouth every 6 (six) hours as needed (breakthrough palpitations). 360 tablet 1   diltiazem  (CARTIA  XT) 120 MG 24 hr capsule Take 1 capsule (120 mg total) by mouth daily. Please keep scheduled appointment for future refills 90 capsule 3   isosorbide  mononitrate (IMDUR ) 30 MG 24 hr tablet TAKE ONE-HALF TABLET BY MOUTH DAILY 135 tablet 1   losartan  (COZAAR ) 50 MG tablet Take 1 tablet (50 mg total) by mouth daily. 90 tablet 3   metFORMIN  (GLUCOPHAGE -XR) 500 MG 24 hr tablet Take 1 tablet (500 mg total) by  mouth at bedtime. 90 tablet 3   Multiple Vitamin (MULTIVITAMIN) tablet Take 1 tablet by mouth daily.     pantoprazole  (PROTONIX ) 40 MG tablet Take 1 tablet (40 mg total) by mouth daily. 90 tablet 3   rosuvastatin  (CRESTOR ) 20 MG tablet Take 1 tablet (20 mg total) by mouth daily. 90 tablet 3   solifenacin (VESICARE) 5 MG tablet Take 5 mg by mouth daily.     tamsulosin  (FLOMAX ) 0.4 MG CAPS capsule Take 0.4 mg by mouth daily.     traZODone  (DESYREL ) 100 MG tablet Take 1 tablet (100 mg total) by mouth at bedtime as needed for sleep. 90 tablet 0   warfarin (COUMADIN ) 5 MG tablet take half to one tablet by mouth daily or as directed by coumadin  clinic 30 tablet 0   nitroGLYCERIN  (NITROSTAT ) 0.4 MG SL tablet Place 1 tablet (0.4 mg total) under the tongue every 5 (five) minutes as needed for chest pain. (Patient not taking: Reported on 12/15/2023) 30 tablet 3   Sodium Sulfate-Mag Sulfate-KCl (SUTAB ) 1479-225-188 MG TABS Use as directed for colonoscopy. MANUFACTURER CODES!! BIN: [id number] PCN: CN GROUP: [id number] MEMBER ID: [id number];MLW AS SECONDARY INSURANCE ;NO PRIOR AUTHORIZATION 24 tablet 0   Turmeric 1053 MG TABS Take 3 tablets by mouth daily.     No current facility-administered medications on file prior to visit.    BP 138/80   Pulse (!) 103   Temp 97.9 F (36.6 C) (Oral)   Ht 5' 8.5 (1.74 m)   Wt 197 lb (89.4 kg)   SpO2 96%   BMI 29.52 kg/m       Objective:   Physical Exam Vitals and nursing note reviewed.  Constitutional:      Appearance: Normal appearance. He is obese.  HENT:  Head:      Right Ear: Hearing, tympanic membrane, ear canal and external ear normal. No hemotympanum.     Left Ear: Hearing, tympanic membrane, ear canal and external ear normal. No hemotympanum.  Cardiovascular:     Rate and Rhythm: Normal rate and regular rhythm.     Pulses: Normal pulses.     Heart sounds: Normal heart sounds.  Pulmonary:     Effort: Pulmonary effort is normal.     Breath  sounds: Normal breath sounds.  Chest:     Chest wall: Tenderness present.    Musculoskeletal:     Comments: Discomfort to left hip with straight leg raise, knee to chest and internal rotations   Skin:    General: Skin is warm and dry.  Neurological:     General: No focal deficit present.     Mental Status: He is alert and oriented to person, place, and time.  Psychiatric:        Mood and Affect: Mood normal.        Behavior: Behavior normal.        Thought Content: Thought content normal.        Judgment: Judgment normal.       Assessment & Plan:  1. Left hip pain (Primary) - Likely arthritic. Will order xray. He can continue with Tylenol  as needed. Consider referral to orthopedics - DG Hip Unilat W OR W/O Pelvis 2-3 Views Left; Future  2. Chest wall pain - Will get chest xray but likely did not fracture sternum. Advised against climbing ladders, especially while on coumadin .  - He is not experiencing any signs of intracranial hemorrhage at this point and it has bene 5 days. Will forgo CT head.  - DG Chest 2 View; Future  3. Hearing loss of left ear, unspecified hearing loss type  - Ambulatory referral to ENT  4. Need for influenza vaccination  - Flu vaccine HIGH DOSE PF(Fluzone Trivalent) James Vallee, NP  I personally spent a total of 31 minutes in the care of the patient today including preparing to see the patient, getting/reviewing separately obtained history, performing a medically appropriate exam/evaluation, counseling and educating, placing orders, and documenting clinical information in the EHR.

## 2023-12-18 ENCOUNTER — Other Ambulatory Visit

## 2023-12-18 ENCOUNTER — Ambulatory Visit (INDEPENDENT_AMBULATORY_CARE_PROVIDER_SITE_OTHER)

## 2023-12-18 DIAGNOSIS — M25552 Pain in left hip: Secondary | ICD-10-CM | POA: Diagnosis not present

## 2023-12-18 DIAGNOSIS — M1612 Unilateral primary osteoarthritis, left hip: Secondary | ICD-10-CM | POA: Diagnosis not present

## 2023-12-18 DIAGNOSIS — I700 Atherosclerosis of aorta: Secondary | ICD-10-CM | POA: Diagnosis not present

## 2023-12-18 DIAGNOSIS — R079 Chest pain, unspecified: Secondary | ICD-10-CM | POA: Diagnosis not present

## 2023-12-18 DIAGNOSIS — R0789 Other chest pain: Secondary | ICD-10-CM | POA: Diagnosis not present

## 2023-12-22 ENCOUNTER — Ambulatory Visit: Payer: Self-pay | Admitting: Adult Health

## 2023-12-24 ENCOUNTER — Other Ambulatory Visit: Payer: Self-pay | Admitting: Cardiology

## 2024-01-06 ENCOUNTER — Other Ambulatory Visit: Payer: Self-pay | Admitting: Cardiology

## 2024-01-18 ENCOUNTER — Ambulatory Visit: Attending: Cardiology | Admitting: Pharmacist

## 2024-01-18 DIAGNOSIS — D6869 Other thrombophilia: Secondary | ICD-10-CM | POA: Insufficient documentation

## 2024-01-18 DIAGNOSIS — Z7901 Long term (current) use of anticoagulants: Secondary | ICD-10-CM | POA: Diagnosis not present

## 2024-01-18 DIAGNOSIS — I4819 Other persistent atrial fibrillation: Secondary | ICD-10-CM | POA: Diagnosis not present

## 2024-01-18 LAB — POCT INR: INR: 3.6 — AB (ref 2.0–3.0)

## 2024-01-18 NOTE — Progress Notes (Signed)
 Description   INR 3.6: Skip dose today and then continue warfarin 1/2 tablet daily.  Stay consistent with greens each week (3-4 per week)  Recheck INR in 3 weeks  Coumadin  Clinic [phone] number [phone]

## 2024-01-18 NOTE — Patient Instructions (Signed)
 Description   INR 3.6: Skip dose today and then continue warfarin 1/2 tablet daily.  Stay consistent with greens each week (3-4 per week)  Recheck INR in 3 weeks  Coumadin  Clinic [phone] number [phone]

## 2024-01-20 ENCOUNTER — Ambulatory Visit (INDEPENDENT_AMBULATORY_CARE_PROVIDER_SITE_OTHER): Admitting: Physician Assistant

## 2024-01-20 ENCOUNTER — Encounter (INDEPENDENT_AMBULATORY_CARE_PROVIDER_SITE_OTHER): Payer: Self-pay | Admitting: Physician Assistant

## 2024-01-20 VITALS — BP 130/82 | HR 89 | Temp 97.8°F | Ht 69.0 in | Wt 193.0 lb

## 2024-01-20 DIAGNOSIS — L918 Other hypertrophic disorders of the skin: Secondary | ICD-10-CM | POA: Diagnosis not present

## 2024-01-20 DIAGNOSIS — H9192 Unspecified hearing loss, left ear: Secondary | ICD-10-CM | POA: Diagnosis not present

## 2024-01-20 NOTE — Progress Notes (Signed)
 Dear Dr. Merna, Here is my assessment for our mutual patient, James Edwards. Thank you for allowing me the opportunity to care for your patient. Please do not hesitate to contact me should you have any other questions. Sincerely, Chyrl Cohen PA-C  Otolaryngology Clinic Note Referring provider: Dr. Merna HPI:  James Edwards is a 73 y.o. male kindly referred by Dr. Merna   Discussed the use of AI scribe software for clinical note transcription with the patient, who gave verbal consent to proceed.  History of Present Illness   James Edwards is a 73 year old male who presents with hearing loss in the left ear. He was referred by [facility] for evaluation of hearing loss.  He has experienced gradual and stable hearing loss in his left ear for the past two to three years, causing difficulty hearing certain sounds like the 'blinking on the blinker' and challenges following conversations with more than three people. No associated pain, trauma, or history of ear infections. There is no history of significant noise exposure except for music during childhood. No dizziness.  [facility] advised him to seek medical evaluation due to an unspecified abnormality on a hearing test, but no test results were provided. He mentions that [facility] may have noted a small bump in his left ear as a concern.           Independent Review of Additional Tests or Records:  none   PMH/Meds/All/SocHx/FamHx/ROS:   Past Medical History:  Diagnosis Date   BPH without obstruction/lower urinary tract symptoms    Coronary artery disease    cardiologist--- dr hobart;  cath 05-18-2020  mininmal nonobstructive disease   ED (erectile dysfunction)    GERD (gastroesophageal reflux disease)    no meds   History of 2019 novel coronavirus disease (COVID-19) 2020   per pt asymptomatic   History of melanoma excision 06/2020   per pt localized area mid back   Hyperlipidemia, mixed    Hypertension     followed by pcp   Mild obstructive sleep apnea    followed by dr young;   study in epic 09-25-2020,  AHI 8.8/hr,  no cpap   OA (osteoarthritis)    Persistent atrial fibrillation (HCC) 03/2020   cardiologist-- dr h. hobart---  on xarelto  and bb;  work-up includes TTE, CTA, event monitor, and cardiac cath (results in epic),  pt ef 55-656 per cath and echo 55%   Pre-diabetes    Prostate cancer (HCC) 08/2009   urologist--- dr renda--- first dx 07/ 2011,  Gleason 3+3,  active surveillance since;  last bx 06/ 2022  Gleason 7 scheduled for brachtherapy   Wears glasses      Past Surgical History:  Procedure Laterality Date   COLONOSCOPY     last one 2019 approx   ELBOW SURGERY Right 2007   FINGER SURGERY     LEFT HEART CATH AND CORONARY ANGIOGRAPHY N/A 05/18/2020   Procedure: LEFT HEART CATH AND CORONARY ANGIOGRAPHY;  Surgeon: Jordan, Peter M, MD;  Location: [facility] INVASIVE CV LAB;  Service: Cardiovascular;  Laterality: N/A;   OPEN REDUCTION INTERNAL FIXATION (ORIF) DISTAL PHALANX Left 01/14/2023   Procedure: REPAIR OF FRACTURE LEFT MIDDLE FINGER AND LEFT RING FINGER. AMPUTATE TIP OF MIDDLE FINGER;  Surgeon: Lorretta Dess, MD;  Location: [facility];  Service: Plastics;  Laterality: Left;   PROSTATE BIOPSY N/A 01/21/2016   Procedure: BIOPSY TRANSRECTAL ULTRASONIC PROSTATE (TUBP);  Surgeon: Gretel Renda, MD;  Location: [facility];  Service:  Urology;  Laterality: N/A;   PROSTATE BIOPSY N/A 07/15/2018   Procedure: NEEDLE BIOPSY TRANSRECTAL ULTRASONIC PROSTATE (TUBP);  Surgeon: Renda Glance, MD;  Location: [facility];  Service: Urology;  Laterality: N/A;   PROSTATE BIOPSY N/A 08/13/2020   Procedure: BIOPSY TRANSRECTAL ULTRASONIC PROSTATE (TUBP);  Surgeon: Renda Glance, MD;  Location[facility];  Service: Urology;  Laterality: N/A;   RADIOACTIVE SEED IMPLANT N/A 04/11/2021   Procedure: RADIOACTIVE SEED IMPLANT/BRACHYTHERAPY IMPLANT WITH CYSTOSCOPY;  Surgeon: Renda Glance, MD;  Location:  [facility];  Service: Urology;  Laterality: N/A;   SPACE OAR INSTILLATION N/A 04/11/2021   Procedure: SPACE OAR INSTILLATION;  Surgeon: Renda Glance, MD;  Location: [facility];  Service: Urology;  Laterality: N/A;    Family History  Problem Relation Age of Onset   Heart disease Mother    Hypertension Mother    Glaucoma Mother    Arthritis Father    Heart disease Father    Hypertension Father    Bladder Cancer Father 93   Colon polyps Father    Dementia Brother    Lymphoma Cousin    Prostate cancer Cousin        mother's first cousin   Colon cancer Neg Hx    Esophageal cancer Neg Hx    Rectal cancer Neg Hx    Stomach cancer Neg Hx    Breast cancer Neg Hx      Social Connections: Moderately Isolated (11/27/2023)   Social Connection and Isolation Panel    Frequency of Communication with Friends and Family: More than three times a week    Frequency of Social Gatherings with Friends and Family: More than three times a week    Attends Religious Services: Never    Database Administrator or Organizations: No    Attends Banker Meetings: Never    Marital Status: Married      Current Outpatient Medications:    acetaminophen  (TYLENOL ) 500 MG tablet, Take 1,000 mg by mouth every 6 (six) hours as needed for moderate pain (pain score 4-6)., Disp: , Rfl:    diltiazem  (CARDIZEM  CD) 120 MG 24 hr capsule, TAKE ONE CAPSULE BY MOUTH ONCE A DAY, Disp: 90 capsule, Rfl: 0   diltiazem  (CARDIZEM ) 30 MG tablet, Take 1 tablet (30 mg total) by mouth every 6 (six) hours as needed (breakthrough palpitations)., Disp: 360 tablet, Rfl: 1   isosorbide  mononitrate (IMDUR ) 30 MG 24 hr tablet, TAKE ONE-HALF TABLET BY MOUTH DAILY, Disp: 135 tablet, Rfl: 1   losartan  (COZAAR ) 50 MG tablet, TAKE ONE TABLET BY MOUTH ONCE A DAY, Disp: 90 tablet, Rfl: 3   metFORMIN  (GLUCOPHAGE -XR) 500 MG 24 hr tablet, Take 1 tablet (500 mg total) by mouth at bedtime., Disp: 90 tablet,  Rfl: 3   Multiple Vitamin (MULTIVITAMIN) tablet, Take 1 tablet by mouth daily., Disp: , Rfl:    pantoprazole  (PROTONIX ) 40 MG tablet, Take 1 tablet (40 mg total) by mouth daily., Disp: 90 tablet, Rfl: 3   rosuvastatin  (CRESTOR ) 20 MG tablet, Take 1 tablet (20 mg total) by mouth daily., Disp: 90 tablet, Rfl: 3   Sodium Sulfate-Mag Sulfate-KCl (SUTAB ) 1479-225-188 MG TABS, Use as directed for colonoscopy. MANUFACTURER CODES!! BIN: [id number] PCN: CN GROUP: [id number] MEMBER ID: [id number];MLW AS SECONDARY INSURANCE ;NO PRIOR AUTHORIZATION, Disp: 24 tablet, Rfl: 0   solifenacin (VESICARE) 5 MG tablet, Take 5 mg by mouth daily., Disp: , Rfl:    tamsulosin  (FLOMAX ) 0.4 MG CAPS capsule, Take 0.4  mg by mouth daily., Disp: , Rfl:    Turmeric 1053 MG TABS, Take 3 tablets by mouth daily., Disp: , Rfl:    warfarin (COUMADIN ) 5 MG tablet, take half to one tablet by mouth daily or as directed by coumadin  clinic, Disp: 30 tablet, Rfl: 0   nitroGLYCERIN  (NITROSTAT ) 0.4 MG SL tablet, Place 1 tablet (0.4 mg total) under the tongue every 5 (five) minutes as needed for chest pain. (Patient not taking: Reported on 01/20/2024), Disp: 30 tablet, Rfl: 3   traZODone  (DESYREL ) 100 MG tablet, Take 1 tablet (100 mg total) by mouth at bedtime as needed for sleep. (Patient not taking: Reported on 01/20/2024), Disp: 90 tablet, Rfl: 0   Physical Exam:   BP 130/82   Pulse 89   Temp 97.8 F (36.6 C)   Ht 5' 9 (1.753 m)   Wt 193 lb (87.5 kg)   SpO2 95%   BMI 28.50 kg/m   Pertinent Findings  CN II-XII grossly intact Bilateral EAC clear and TM intact with well pneumatized middle ear spaces, nodule in the left anterior external auditory canal Anterior rhinoscopy: Septum midline; bilateral inferior turbinates with no hypertrophy No lesions of oral cavity/oropharynx; dentition within normal limits No obviously palpable neck masses/lymphadenopathy/thyromegaly No respiratory distress or stridor   Seprately Identifiable  Procedures:  None  Impression & Plans:  James Edwards is a 73 y.o. male with the following   Assessment and Plan    Left ear hearing loss Gradual onset over 2-3 years, difficulty in noisy environments. No pain, trauma, or dizziness. Medical evaluation required for potential asymmetry. - Obtain hearing test from [facility] or perform in office. - Review results to determine need for hearing aids or further evaluation. - Consider MRI if asymmetry is confirmed. - Proceed with hearing aid fitting if test is within limits.  Benign skin-colored bump in left external auditory canal Small, likely benign bump in anterior canal. No malignancy signs. - Monitor for changes in size or symptoms. - Re-evaluate in one year unless symptoms develop.           - f/u review audio, 1 year follow-up in the office for evaluation of ear   Thank you for allowing me the opportunity to care for your patient. Please do not hesitate to contact me should you have any other questions.  Sincerely, Chyrl Cohen PA-C [facility] Phone: [phone] Fax: [phone]  01/20/2024, 9:45 AM

## 2024-01-25 DIAGNOSIS — L814 Other melanin hyperpigmentation: Secondary | ICD-10-CM | POA: Diagnosis not present

## 2024-01-25 DIAGNOSIS — Z85828 Personal history of other malignant neoplasm of skin: Secondary | ICD-10-CM | POA: Diagnosis not present

## 2024-01-25 DIAGNOSIS — B078 Other viral warts: Secondary | ICD-10-CM | POA: Diagnosis not present

## 2024-01-25 DIAGNOSIS — D485 Neoplasm of uncertain behavior of skin: Secondary | ICD-10-CM | POA: Diagnosis not present

## 2024-01-25 DIAGNOSIS — D044 Carcinoma in situ of skin of scalp and neck: Secondary | ICD-10-CM | POA: Diagnosis not present

## 2024-01-25 DIAGNOSIS — Z85820 Personal history of malignant melanoma of skin: Secondary | ICD-10-CM | POA: Diagnosis not present

## 2024-01-25 DIAGNOSIS — L570 Actinic keratosis: Secondary | ICD-10-CM | POA: Diagnosis not present

## 2024-01-25 DIAGNOSIS — L918 Other hypertrophic disorders of the skin: Secondary | ICD-10-CM | POA: Diagnosis not present

## 2024-01-25 DIAGNOSIS — C44319 Basal cell carcinoma of skin of other parts of face: Secondary | ICD-10-CM | POA: Diagnosis not present

## 2024-01-26 ENCOUNTER — Other Ambulatory Visit: Payer: Self-pay | Admitting: Cardiology

## 2024-01-26 ENCOUNTER — Other Ambulatory Visit: Payer: Self-pay | Admitting: Adult Health

## 2024-01-26 DIAGNOSIS — F339 Major depressive disorder, recurrent, unspecified: Secondary | ICD-10-CM

## 2024-01-28 NOTE — Telephone Encounter (Signed)
 Pt of Dr. Jeffrie. Last O/V in 01/2023. F/U in 2yr. Dr. Jeffrie noted in the  O/V that he is ok with PCP refilling RX's. Does Dr. Jeffrie want to refill this RX or would he rather PCP refill? Please advise.

## 2024-02-05 ENCOUNTER — Other Ambulatory Visit: Payer: Self-pay | Admitting: Cardiology

## 2024-02-05 ENCOUNTER — Ambulatory Visit: Attending: Cardiology | Admitting: *Deleted

## 2024-02-05 DIAGNOSIS — I4819 Other persistent atrial fibrillation: Secondary | ICD-10-CM | POA: Insufficient documentation

## 2024-02-05 DIAGNOSIS — Z7901 Long term (current) use of anticoagulants: Secondary | ICD-10-CM | POA: Diagnosis present

## 2024-02-05 DIAGNOSIS — D6869 Other thrombophilia: Secondary | ICD-10-CM | POA: Diagnosis present

## 2024-02-05 LAB — POCT INR: INR: 3.3 — AB (ref 2.0–3.0)

## 2024-02-05 MED ORDER — WARFARIN SODIUM 2.5 MG PO TABS: ORAL_TABLET | ORAL | 1 refills | Status: AC

## 2024-02-05 NOTE — Telephone Encounter (Signed)
 Saw patient in the Anticoagulation Clinis today, advised will send in 2.5mg  tablet at this time. Spoke with [facility] Pharmacist and she is aware of the change and confirmed she had the 2.5mg  tablet refill there and will remove the 5mg  off the list at this time and states she was appreciative of the call.   Pt educated on the doseage and confirmed instructions.

## 2024-02-05 NOTE — Patient Instructions (Addendum)
 Description   SENDING IN 2.5MG  TABLET TODAY. INR 3.3; Do not take any warfarin today then continue warfarin 2.5MG  daily.  Stay consistent with greens each week (3-4 per week)  Recheck INR in 3 weeks.  Coumadin  Clinic [phone] number [phone]

## 2024-02-05 NOTE — Progress Notes (Signed)
 Description   SENDING IN 2.5MG  TABLET TODAY. INR 3.3; Do not take any warfarin today then continue warfarin 2.5MG  daily.  Stay consistent with greens each week (3-4 per week)  Recheck INR in 3 weeks.  Coumadin  Clinic [phone] number [phone]

## 2024-02-09 NOTE — Telephone Encounter (Signed)
 Please request refill from pt's PCP Darleene Shape - pt not seen by [facility] since 01/2023

## 2024-02-26 ENCOUNTER — Ambulatory Visit: Attending: Cardiology

## 2024-02-26 DIAGNOSIS — I4819 Other persistent atrial fibrillation: Secondary | ICD-10-CM | POA: Insufficient documentation

## 2024-02-26 DIAGNOSIS — Z7901 Long term (current) use of anticoagulants: Secondary | ICD-10-CM | POA: Insufficient documentation

## 2024-02-26 DIAGNOSIS — D6869 Other thrombophilia: Secondary | ICD-10-CM | POA: Insufficient documentation

## 2024-02-26 LAB — POCT INR: INR: 2.1 (ref 2.0–3.0)

## 2024-02-26 NOTE — Progress Notes (Signed)
"   INR 2.1 Please see anticoagulation encounter  continue warfarin 2.5 mg daily.  Stay consistent with greens each week (1-2 per week)  Recheck INR in 4 weeks.  Coumadin  Clinic [phone] number [phone] "

## 2024-02-26 NOTE — Patient Instructions (Signed)
 continue warfarin 2.5 mg daily.  Stay consistent with greens each week (1-2 per week)  Recheck INR in 4 weeks.  Coumadin  Clinic [phone] number [phone]

## 2024-03-07 ENCOUNTER — Other Ambulatory Visit: Payer: Self-pay | Admitting: Internal Medicine

## 2024-03-16 ENCOUNTER — Ambulatory Visit: Attending: Cardiology | Admitting: Cardiology

## 2024-03-16 VITALS — BP 130/82 | HR 86 | Ht 69.0 in | Wt 200.0 lb

## 2024-03-16 DIAGNOSIS — E119 Type 2 diabetes mellitus without complications: Secondary | ICD-10-CM | POA: Diagnosis not present

## 2024-03-16 DIAGNOSIS — I4819 Other persistent atrial fibrillation: Secondary | ICD-10-CM | POA: Diagnosis not present

## 2024-03-16 DIAGNOSIS — Z79899 Other long term (current) drug therapy: Secondary | ICD-10-CM | POA: Insufficient documentation

## 2024-03-16 DIAGNOSIS — C61 Malignant neoplasm of prostate: Secondary | ICD-10-CM | POA: Insufficient documentation

## 2024-03-16 DIAGNOSIS — E782 Mixed hyperlipidemia: Secondary | ICD-10-CM | POA: Insufficient documentation

## 2024-03-16 DIAGNOSIS — I10 Essential (primary) hypertension: Secondary | ICD-10-CM | POA: Diagnosis not present

## 2024-03-16 LAB — CBC

## 2024-03-16 NOTE — Progress Notes (Signed)
 " Cardiology Office Note:  .   Date:  03/16/2024  ID:  James Edwards James Edwards, DOB May 22, 1950, MRN [id number] PCP: Merna Huxley, NP  [facility] Providers Cardiologist:  Oneil Parchment, MD Electrophysiologist:  Lynwood Rakers, MD (Inactive)     History of Present Illness: .   James Edwards is a 74 y.o. male Discussed the use of AI scribe  History of Present Illness James Edwards is a 75 year old male with coronary artery disease and persistent atrial fibrillation who presents for follow-up.  Coronary artery disease and hyperlipidemia - Left heart catheterization in April 2022 revealed 20% stenosis in the proximal LAD and 30% stenosis in the distal circumflex. - Coronary CT scan in March 2022 showed a calcium  score of 224, mild calcified plaque in the proximal and mid LAD, and small focal plaque in the circumflex. - Managed with Crestor  20 mg daily for hyperlipidemia. - Recent LDL 59 mg/dL, triglycerides 91 mg/dL.  Atrial fibrillation and anticoagulation - Persistent atrial fibrillation with unchanged symptoms and no significant changes in heart rate. - Managed with diltiazem  120 mg and short-acting diltiazem  30 mg daily. - Previously treated with metoprolol , discontinued due to fatigue. - Anticoagulation switched from Xarelto  to Coumadin  due to cost issues. - Recent INR noted to be 2.1.  Hypertension and angina - Managed with losartan  50 mg daily for hypertension. - Isosorbide  15 mg daily for angina.  Lower extremity edema - Lower extremity edema, more pronounced on the right side. - Vascular ultrasound in 2024 showed no evidence of DVT. - Continues with conservative management.  Prostate cancer status - History of prostate cancer, stable following radiation seed implant treatment.  Recent laboratory findings - Hemoglobin 13.8 g/dL. - Creatinine 0.94 mg/dL. - Potassium 4.2 mmol/L. - TSH 1.4 mIU/L.     Studies Reviewed: SABRA   EKG  Interpretation Date/Time:  Wednesday March 16 2024 09:20:17 EST Ventricular Rate:  86 PR Interval:    QRS Duration:  70 QT Interval:  362 QTC Calculation: 433 R Axis:   -17  Text Interpretation: Atrial fibrillation When compared with ECG of 20-Jan-2023 08:58, No significant change since last tracing Confirmed by Parchment Oneil ([id number]) on 03/16/2024 9:22:12 AM    Results Labs Hemoglobin: 13.8 INR: 6.4 Creatinine: 0.94 LDL: 59, increased from 52 in 2023 Potassium: 4.2 Triglycerides: 91 TSH: 1.4  Radiology Coronary CT calcium  score (March 2022): Calcium  score 224, mild calcified plaque in proximal and mid LAD, small focal plaque in circumflex  Diagnostic Left heart catheterization (April 2022): 20% stenosis in proximal LAD, 30% stenosis in distal circumflex Vascular ultrasound lower extremity (2024): No DVT Risk Assessment/Calculations:            Physical Exam:   VS:  BP 130/82 (BP Location: Right Arm, Patient Position: Sitting, Cuff Size: Normal)   Pulse 86   Ht 5' 9 (1.753 m)   Wt 200 lb (90.7 kg)   SpO2 97%   BMI 29.53 kg/m    Wt Readings from Last 3 Encounters:  03/16/24 200 lb (90.7 kg)  01/20/24 193 lb (87.5 kg)  12/15/23 197 lb (89.4 kg)    GEN: Well nourished, well developed in no acute distress NECK: No JVD; No carotid bruits CARDIAC: irreg irreg, no murmurs, no rubs, no gallops RESPIRATORY:  Clear to auscultation without rales, wheezing or rhonchi  ABDOMEN: Soft, non-tender, non-distended EXTREMITIES:  No edema; No deformity   ASSESSMENT AND PLAN: .    Assessment and Plan  Assessment & Plan Persistent atrial fibrillation Chronic atrial fibrillation managed with rate control. Heart rate is well-managed. Previous metoprolol  discontinued due to fatigue. Diltiazem  is used for rate control. Atrial fibrillation ablation not feasible due to extensive scarring. INR is 2.1, indicating effective anticoagulation with Coumadin . - Continue diltiazem  for rate  control. - Continue Coumadin  for anticoagulation.  Nonobstructive coronary artery disease Mild nonobstructive coronary artery disease with 20% proximal LAD and 30% distal circumflex stenosis. Managed with medical therapy including statins and antihypertensives. No recent chest pain reported. Isosorbide  is used for anti-anginal effect. Discussed potential interaction with ED medications. - Continue Crestor  for hyperlipidemia. - Continue losartan  for hypertension. - Continue isosorbide  for anti-anginal effect. - Advised against concurrent use of isosorbide  and ED medications due to risk of hypotension.  Essential hypertension Hypertension managed with losartan . Blood pressure control is part of coronary artery disease management. - Continue losartan  for blood pressure control.  Type 2 diabetes mellitus Diabetes management is part of coronary artery disease treatment strategy. - Continue current diabetes management regimen.  Hyperlipidemia Managed with Crestor . LDL levels are well-controlled. - Continue Crestor  for hyperlipidemia.  History of prostate cancer Prostate cancer treated with radiation seed implant. Currently well-managed with no concerns. Dr. Renda  Checking labs. Follow with PCP.        Dispo: 2 yrs  Signed, Oneil Parchment, MD  "

## 2024-03-16 NOTE — Patient Instructions (Addendum)
 Medication Instructions:  The current medical regimen is effective;  continue present plan and medications.  *If you need a refill on your cardiac medications before your next appointment, please call your pharmacy*  Lab: Please have blood work today (CBC, CMP and Lipid)  Follow-Up: At [facility], you and your health needs are our priority.  As part of our continuing mission to provide you with exceptional heart care, our providers are all part of one team.  This team includes your primary Cardiologist (physician) and Advanced Practice Providers or APPs (Physician Assistants and Nurse Practitioners) who all work together to provide you with the care you need, when you need it.  Your next appointment:   2 year(s)  Provider:   Oneil Parchment, MD    We recommend signing up for the patient portal called MyChart.  Sign up information is provided on this After Visit Summary.  MyChart is used to connect with patients for Virtual Visits (Telemedicine).  Patients are able to view lab/test results, encounter notes, upcoming appointments, etc.  Non-urgent messages can be sent to your provider as well.   To learn more about what you can do with MyChart, go to [email or web address].

## 2024-03-17 LAB — LIPID PANEL
Chol/HDL Ratio: 2.2 ratio (ref 0.0–5.0)
Cholesterol, Total: 139 mg/dL (ref 100–199)
HDL: 62 mg/dL
LDL Chol Calc (NIH): 60 mg/dL (ref 0–99)
Triglycerides: 93 mg/dL (ref 0–149)
VLDL Cholesterol Cal: 17 mg/dL (ref 5–40)

## 2024-03-17 LAB — COMPREHENSIVE METABOLIC PANEL WITH GFR
ALT: 21 [IU]/L (ref 0–44)
AST: 19 [IU]/L (ref 0–40)
Albumin: 4.2 g/dL (ref 3.8–4.8)
Alkaline Phosphatase: 36 [IU]/L — ABNORMAL LOW (ref 47–123)
BUN/Creatinine Ratio: 10 (ref 10–24)
BUN: 10 mg/dL (ref 8–27)
Bilirubin Total: 0.5 mg/dL (ref 0.0–1.2)
CO2: 23 mmol/L (ref 20–29)
Calcium: 10.2 mg/dL (ref 8.6–10.2)
Chloride: 103 mmol/L (ref 96–106)
Creatinine, Ser: 0.97 mg/dL (ref 0.76–1.27)
Globulin, Total: 2.1 g/dL (ref 1.5–4.5)
Glucose: 121 mg/dL — ABNORMAL HIGH (ref 70–99)
Potassium: 4.5 mmol/L (ref 3.5–5.2)
Sodium: 140 mmol/L (ref 134–144)
Total Protein: 6.3 g/dL (ref 6.0–8.5)
eGFR: 82 mL/min/{1.73_m2}

## 2024-03-17 LAB — CBC
Hematocrit: 43 (ref 37.5–51.0)
Hemoglobin: 14.2 g/dL (ref 13.0–17.7)
MCH: 32.1 pg (ref 26.6–33.0)
MCHC: 33 g/dL (ref 31.5–35.7)
MCV: 97 fL (ref 79–97)
Platelets: 264 10*3/uL (ref 150–450)
RBC: 4.42 x10E6/uL (ref 4.14–5.80)
RDW: 11.9 (ref 11.6–15.4)
WBC: 7.6 10*3/uL (ref 3.4–10.8)

## 2024-03-18 ENCOUNTER — Ambulatory Visit: Payer: Self-pay | Admitting: Cardiology

## 2024-03-22 ENCOUNTER — Other Ambulatory Visit: Payer: Self-pay | Admitting: Cardiology

## 2024-03-25 ENCOUNTER — Ambulatory Visit

## 2024-03-25 DIAGNOSIS — I4819 Other persistent atrial fibrillation: Secondary | ICD-10-CM

## 2024-03-25 DIAGNOSIS — Z7901 Long term (current) use of anticoagulants: Secondary | ICD-10-CM

## 2024-03-25 DIAGNOSIS — D6869 Other thrombophilia: Secondary | ICD-10-CM

## 2024-03-25 LAB — POCT INR: INR: 2.4 (ref 2.0–3.0)

## 2024-03-25 NOTE — Telephone Encounter (Signed)
 Pt had a lipid panel done on 03/16/2024 medication sent to pt's pharmacy confirmation received.

## 2024-03-25 NOTE — Progress Notes (Signed)
 INR 2.4  continue warfarin 2.5 mg daily.  Stay consistent with greens each week (1-2 per week)  Recheck INR in 6 weeks.  Coumadin  Clinic [phone] number [phone]

## 2024-03-25 NOTE — Patient Instructions (Signed)
"   continue warfarin 2.5 mg daily.  Stay consistent with greens each week (1-2 per week)  Recheck INR in 6 weeks.  Coumadin  Clinic [phone] number [phone] "

## 2024-05-06 ENCOUNTER — Ambulatory Visit

## 2024-12-02 ENCOUNTER — Ambulatory Visit
